# Patient Record
Sex: Female | Born: 1940 | Race: White | Hispanic: No | Marital: Married | Smoking: Former smoker
Health system: Southern US, Community
[De-identification: ages and names within clinical notes are randomized; demographics above are authoritative.]

## PROBLEM LIST (undated history)

## (undated) DIAGNOSIS — H269 Unspecified cataract: Secondary | ICD-10-CM

## (undated) DIAGNOSIS — Z8669 Personal history of other diseases of the nervous system and sense organs: Secondary | ICD-10-CM

## (undated) DIAGNOSIS — E7800 Pure hypercholesterolemia, unspecified: Secondary | ICD-10-CM

## (undated) DIAGNOSIS — I8390 Asymptomatic varicose veins of unspecified lower extremity: Secondary | ICD-10-CM

## (undated) DIAGNOSIS — E119 Type 2 diabetes mellitus without complications: Secondary | ICD-10-CM

## (undated) DIAGNOSIS — M1990 Unspecified osteoarthritis, unspecified site: Secondary | ICD-10-CM

## (undated) DIAGNOSIS — K219 Gastro-esophageal reflux disease without esophagitis: Secondary | ICD-10-CM

## (undated) DIAGNOSIS — I10 Essential (primary) hypertension: Secondary | ICD-10-CM

## (undated) DIAGNOSIS — Z923 Personal history of irradiation: Secondary | ICD-10-CM

## (undated) DIAGNOSIS — N200 Calculus of kidney: Secondary | ICD-10-CM

## (undated) DIAGNOSIS — C50919 Malignant neoplasm of unspecified site of unspecified female breast: Secondary | ICD-10-CM

## (undated) DIAGNOSIS — E079 Disorder of thyroid, unspecified: Secondary | ICD-10-CM

## (undated) HISTORY — DX: Unspecified osteoarthritis, unspecified site: M19.90

## (undated) HISTORY — PX: VARICOSE VEIN SURGERY: SHX832

## (undated) HISTORY — DX: Gastro-esophageal reflux disease without esophagitis: K21.9

## (undated) HISTORY — DX: Disorder of thyroid, unspecified: E07.9

## (undated) HISTORY — DX: Essential (primary) hypertension: I10

## (undated) HISTORY — DX: Malignant neoplasm of unspecified site of unspecified female breast: C50.919

## (undated) HISTORY — PX: CATARACT EXTRACTION W/ INTRAOCULAR LENS  IMPLANT, BILATERAL: SHX1307

## (undated) HISTORY — DX: Type 2 diabetes mellitus without complications: E11.9

## (undated) HISTORY — DX: Unspecified cataract: H26.9

## (undated) HISTORY — PX: TONSILLECTOMY: SUR1361

---

## 1998-04-06 DIAGNOSIS — Z923 Personal history of irradiation: Secondary | ICD-10-CM

## 1998-04-06 HISTORY — DX: Personal history of irradiation: Z92.3

## 1998-04-06 HISTORY — PX: BREAST BIOPSY: SHX20

## 1998-04-06 HISTORY — PX: BREAST LUMPECTOMY: SHX2

## 2001-04-06 DIAGNOSIS — M8580 Other specified disorders of bone density and structure, unspecified site: Secondary | ICD-10-CM | POA: Insufficient documentation

## 2004-03-17 ENCOUNTER — Ambulatory Visit: Payer: Self-pay

## 2004-07-20 ENCOUNTER — Emergency Department: Payer: Self-pay | Admitting: Emergency Medicine

## 2005-03-25 ENCOUNTER — Ambulatory Visit: Payer: Self-pay

## 2006-04-16 ENCOUNTER — Ambulatory Visit: Payer: Self-pay | Admitting: Family Medicine

## 2007-06-27 ENCOUNTER — Ambulatory Visit: Payer: Self-pay

## 2008-09-04 ENCOUNTER — Ambulatory Visit: Payer: Self-pay | Admitting: Family Medicine

## 2008-12-26 ENCOUNTER — Ambulatory Visit: Payer: Self-pay | Admitting: Ophthalmology

## 2011-08-31 ENCOUNTER — Emergency Department: Payer: Self-pay | Admitting: *Deleted

## 2012-06-17 ENCOUNTER — Ambulatory Visit: Payer: Self-pay | Admitting: Family Medicine

## 2013-05-08 ENCOUNTER — Ambulatory Visit: Payer: Self-pay | Admitting: Physician Assistant

## 2013-05-08 LAB — RAPID INFLUENZA A&B ANTIGENS (ARMC ONLY)

## 2014-04-06 HISTORY — PX: BREAST BIOPSY: SHX20

## 2014-04-06 HISTORY — PX: MASTECTOMY: SHX3

## 2014-07-06 ENCOUNTER — Ambulatory Visit
Admit: 2014-07-06 | Disposition: A | Discharge status: Home / Self Care | Payer: Self-pay | Attending: Nurse Practitioner | Admitting: Nurse Practitioner

## 2014-07-18 ENCOUNTER — Ambulatory Visit
Admit: 2014-07-18 | Disposition: A | Discharge status: Home / Self Care | Payer: Self-pay | Attending: Nurse Practitioner | Admitting: Nurse Practitioner

## 2014-07-27 ENCOUNTER — Ambulatory Visit
Admit: 2014-07-27 | Disposition: A | Discharge status: Home / Self Care | Payer: Medicare Other | Attending: Surgery | Admitting: Surgery

## 2014-08-07 ENCOUNTER — Encounter: Payer: Self-pay | Admitting: Oncology

## 2014-08-07 ENCOUNTER — Inpatient Hospital Stay: Payer: Medicare Other | Attending: Oncology | Admitting: Oncology

## 2014-08-07 VITALS — BP 143/66 | HR 68 | Temp 97.9°F | Resp 18 | Ht 66.0 in | Wt 177.5 lb

## 2014-08-07 DIAGNOSIS — C50911 Malignant neoplasm of unspecified site of right female breast: Secondary | ICD-10-CM

## 2014-08-07 DIAGNOSIS — E119 Type 2 diabetes mellitus without complications: Secondary | ICD-10-CM | POA: Diagnosis not present

## 2014-08-07 DIAGNOSIS — Z87891 Personal history of nicotine dependence: Secondary | ICD-10-CM | POA: Diagnosis not present

## 2014-08-07 DIAGNOSIS — C50211 Malignant neoplasm of upper-inner quadrant of right female breast: Secondary | ICD-10-CM

## 2014-08-07 DIAGNOSIS — I10 Essential (primary) hypertension: Secondary | ICD-10-CM | POA: Diagnosis not present

## 2014-08-07 DIAGNOSIS — Z853 Personal history of malignant neoplasm of breast: Secondary | ICD-10-CM | POA: Diagnosis not present

## 2014-08-07 DIAGNOSIS — Z170 Estrogen receptor positive status [ER+]: Secondary | ICD-10-CM | POA: Diagnosis not present

## 2014-08-07 DIAGNOSIS — Z923 Personal history of irradiation: Secondary | ICD-10-CM

## 2014-08-07 DIAGNOSIS — K219 Gastro-esophageal reflux disease without esophagitis: Secondary | ICD-10-CM | POA: Insufficient documentation

## 2014-08-07 DIAGNOSIS — Z79899 Other long term (current) drug therapy: Secondary | ICD-10-CM

## 2014-08-08 DIAGNOSIS — E039 Hypothyroidism, unspecified: Secondary | ICD-10-CM | POA: Insufficient documentation

## 2014-08-08 DIAGNOSIS — E119 Type 2 diabetes mellitus without complications: Secondary | ICD-10-CM | POA: Insufficient documentation

## 2014-08-08 DIAGNOSIS — M1990 Unspecified osteoarthritis, unspecified site: Secondary | ICD-10-CM | POA: Insufficient documentation

## 2014-08-08 DIAGNOSIS — C50919 Malignant neoplasm of unspecified site of unspecified female breast: Secondary | ICD-10-CM | POA: Insufficient documentation

## 2014-08-08 DIAGNOSIS — E7800 Pure hypercholesterolemia, unspecified: Secondary | ICD-10-CM | POA: Insufficient documentation

## 2014-08-08 DIAGNOSIS — I10 Essential (primary) hypertension: Secondary | ICD-10-CM | POA: Insufficient documentation

## 2014-08-08 NOTE — Progress Notes (Signed)
[place] @ [facility] [phone]  Fax:(336) [place]  Sydney Parks OB: July 20, 1940  MR#: [id number]  IWO#:[id number]  Patient Care Team: Sallee Lange, NP as PCP - General (Internal Medicine)  CHIEF COMPLAINT:  Chief Complaint  Patient presents with  . Breast Cancer   74 year old lady came today further follow-up regarding carcinoma breast.  For evaluation and further planning of treatment. Oncology History   1.  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  Previous history of carcinoma breast in the same breast (right)  And 16 years ago was Tis N0 M0 tumor status post lumpectomy and radiation therapy estrogen and progesterone receptor was positive and had received 5 years of tamoxifen     Breast CA   08/08/2014 Initial Diagnosis Breast CA    No flowsheet data found.  INTERVAL HISTORY: 74 year old lady came today further follow-up regarding carcinoma breast.  For evaluation and further planning of treatment.  REVIEW OF SYSTEMS:   Review of Systems  Constitutional: Negative for fever, chills, weight loss, malaise/fatigue and diaphoresis.  HENT: Negative for congestion, ear discharge, ear pain, hearing loss, nosebleeds, sore throat and tinnitus.   Eyes: Negative for blurred vision, double vision, photophobia, pain, discharge and redness.  Respiratory: Negative for cough, hemoptysis, sputum production, shortness of breath, wheezing and stridor.   Cardiovascular: Negative for chest pain, palpitations, orthopnea, claudication, leg swelling and PND.  Gastrointestinal: Negative for heartburn, nausea, vomiting, abdominal pain, diarrhea, constipation, blood in stool and melena.  Genitourinary: Negative for dysuria, urgency, frequency, hematuria and flank pain.    Musculoskeletal: Negative for myalgias, back pain, joint pain, falls and neck pain.  Skin: Negative for itching and rash.  Neurological: Negative for dizziness, tingling, tremors, sensory change, speech change, focal weakness, seizures, loss of consciousness, weakness and headaches.  Endo/Heme/Allergies: Negative for environmental allergies and polydipsia. Does not bruise/bleed easily.  Psychiatric/Behavioral: Negative for depression, suicidal ideas, hallucinations, memory loss and substance abuse. The patient is not nervous/anxious and does not have insomnia.     As per HPI. Otherwise, a complete review of systems is negatve.  PAST MEDICAL HISTORY: Past Medical History  Diagnosis Date  . Breast cancer   . Arthritis   . Cataract   . Diabetes mellitus without complication   . GERD (gastroesophageal reflux disease)   . Hypertension   . Thyroid disease     PAST SURGICAL HISTORY: History reviewed. No pertinent past surgical history. history of tonsillectomy, lumpectomy in the past.  Cataract surgery in the past. FAMILY HISTORY No family history on file. No family history of breast cancer, colon cancer. 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 GYNECOLOGIC HISTORY:  No LMP recorded.     ADVANCED DIRECTIVES: PATIENT  refused   HEALTH MAINTENANCE: History  Substance Use Topics  . Smoking status: Former Smoker -- 0.50 packs/day for 10 years    Quit date: 08/05/1970  . Smokeless tobacco: Not on file  . Alcohol Use: No     Colonoscopy:  PAP:  Bone density:  Lipid panel:  Allergies not on file  Current Outpatient Prescriptions  Medication Sig Dispense Refill  . levothyroxine (SYNTHROID, LEVOTHROID) 112 MCG tablet Take 112 mcg by mouth daily.    Marland Kitchen lisinopril-hydrochlorothiazide (PRINZIDE,ZESTORETIC) 10-12.5 MG per tablet Take 10-12.5 tablets by mouth daily.    Marland Kitchen lovastatin (  MEVACOR) 10 MG tablet Take 10 mg by mouth daily.    . metFORMIN (GLUCOPHAGE) 500 MG  tablet Take 500 mg by mouth daily.     No current facility-administered medications for this visit.    OBJECTIVE: Filed Vitals:   08/07/14 1515  BP: 143/66  Pulse: 68  Temp: 97.9 F (36.6 C)  Resp: 18     Body mass index is 28.66 kg/(m^2).    ECOG FS:0 - Asymptomatic  Physical Exam  Constitutional: She is oriented to person, place, and time and well-developed, well-nourished, and in no distress. No distress.  HENT:  Head: Normocephalic and atraumatic.  Right Ear: External ear normal.  Left Ear: External ear normal.  Nose: Nose normal.  Mouth/Throat: Oropharynx is clear and moist.  Eyes: Conjunctivae and EOM are normal.  Neck: Normal range of motion. No JVD present. No tracheal deviation present. No thyromegaly present.  Cardiovascular: Normal rate and regular rhythm.  Exam reveals no friction rub.   No murmur heard. Pulmonary/Chest: No stridor. No respiratory distress. She has no wheezes. She has no rales. She exhibits no tenderness.  Abdominal: She exhibits no distension and no mass. There is no tenderness. There is no rebound.  Musculoskeletal: She exhibits no edema or tenderness.  Neurological: She is alert and oriented to person, place, and time. She has normal reflexes. Gait normal. GCS score is 15.  Skin: Skin is warm and dry. No rash noted. She is not diaphoretic.  Psychiatric: Mood, memory and affect normal.   examination of breast: Right breast recent biopsy at 2:00 position healing well.  No palpable mass.  Left breast feel masses.  Axillary lymph nodes not palpable   LAB RESULTS:  No results found for: NA, K, CL, CO2, GLUCOSE, BUN, CREATININE, CALCIUM, PROT, ALBUMIN, AST, ALT, ALKPHOS, BILITOT, GFRNONAA, GFRAA  No results found for: SPEP, UPEP  No results found for: WBC, NEUTROABS, HGB, HCT, MCV, PLT    Chemistry   No results found for: NA, K, CL, CO2, BUN, CREATININE, GLU No results found for: CALCIUM, ALKPHOS, AST, ALT, BILITOT     No results found for:  LABCA2  No components found for: MOLMB867  No results for input(s): INR in the last 168 hours.  No results found for: COLORURINE, APPEARANCEUR, LABSPEC, PHURINE, GLUCOSEU, HGBUR, BILIRUBINUR, KETONESUR, PROTEINUR, UROBILINOGEN, NITRITE, LEUKOCYTESUR  STUDIES: [place] Core Biopsy R/s  08/02/2014   ADDENDUM REPORT: 08/02/2014 11:22  ADDENDUM: Patient's pathology has returned. Findings are invasive mammary carcinoma. Patient's surgeon, Dr. Tamala Julian, gave the patient her pathology results. Patient will be setup within oncology consultation. No reported complaints from the biopsy site.   Electronically Signed   By: Lajean Manes M.D.   On: 08/02/2014 11:22   08/02/2014   CLINICAL DATA:  74 year old female for biopsy of an indeterminate right breast mass. The previously seen mass was identified on today's exam at 2 o'clock, 4 cm from the nipple.  EXAM: ULTRASOUND GUIDED RIGHT BREAST CORE NEEDLE BIOPSY  COMPARISON:  Previous exam(s).  PROCEDURE: I met with the patient and we discussed the procedure of ultrasound-guided biopsy, including benefits and alternatives. We discussed the high likelihood of a successful procedure. We discussed the risks of the procedure including infection, bleeding, tissue injury, clip migration, and inadequate sampling. Informed written consent was given. The usual time-out protocol was performed immediately prior to the procedure.  Using sterile technique and 2% Lidocaine as local anesthetic, under direct ultrasound visualization, a 12 gauge vacuum-assisted device was used to perform biopsy of  an indeterminate right breast mass at 2 o'clock, 4 cm from the nippleusing a medial to lateral approach. At the conclusion of the procedure, a ribbon shaped tissue marker clip was deployed into the biopsy cavity. Follow-up 2-view mammogram was performed and dictated separately.  IMPRESSION: Ultrasound-guided biopsy of an indeterminate right breast mass at 2 o'clock. No apparent complications.   Electronically Signed: By: Pamelia Hoit M.D. On: 07/27/2014 14:03   Mm Post Image Right ([facility] Hx)  07/27/2014   CLINICAL DATA:  74 year old female status post ultrasound-guided right breast biopsy  EXAM: DIAGNOSTIC RIGHT MAMMOGRAM POST ULTRASOUND BIOPSY  COMPARISON:  Previous exam(s).  FINDINGS: Mammographic images were obtained following ultrasound guided biopsy of an indeterminate right breast mass at 2 o'clock. Post biopsy mammogram demonstrates the ribbon shaped biopsy marker to be at the site of the mass within the medial right breast.  IMPRESSION: Satisfactory marker placement post ultrasound-guided right breast biopsy.  Final Assessment: Post Procedure Mammograms for Marker Placement   Electronically Signed   By: Pamelia Hoit M.D.   On: 07/27/2014 14:03    ASSESSMENT:  1.  Carcinoma of right breast status post ultrasound-guided biopsy.  6 mm lesion (April, 2016) at 2:00 position 2.  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  And 5 years of tamoxifen  PLAN:  No problem-specific assessment & plan notes found for this encounter.  I reviewed all the x-rays and all previous history as well as available lab data. Patient has second primary breast cancer in the right breast which has been previously radiated. Question about mastectomy had been discussed in detail. If patient is resistant to get mastectomy further lumpectomy or possibility of for partial breast radiation should be discussed with radiation oncologists. If patient is extremely against mastectomy than lumpectomy followed by anti-hormonal therapy with letrozole may be an option given though that is not is standard option. Total duration of visit was 60  minutes of reviewing all these options with patient and review all the records  Patient expressed understanding and was in agreement with this plan. She also understands that She can call clinic at any time  with any questions, concerns, or complaints.    Breast CA   Staging form: Breast, AJCC 7th Edition     Clinical: Stage IA (T1b, N0, M0) - Unsigned   Forest Gleason, MD   08/08/2014 5:02 PM

## 2014-08-13 ENCOUNTER — Other Ambulatory Visit: Payer: Self-pay | Admitting: Surgery

## 2014-08-13 DIAGNOSIS — C50211 Malignant neoplasm of upper-inner quadrant of right female breast: Secondary | ICD-10-CM

## 2014-08-14 ENCOUNTER — Encounter
Admission: RE | Admit: 2014-08-14 | Discharge: 2014-08-14 | Disposition: A | Discharge status: Home / Self Care | Payer: Medicare Other | Source: Ambulatory Visit | Attending: Surgery | Admitting: Surgery

## 2014-08-14 DIAGNOSIS — Z01810 Encounter for preprocedural cardiovascular examination: Secondary | ICD-10-CM | POA: Diagnosis not present

## 2014-08-14 DIAGNOSIS — M8580 Other specified disorders of bone density and structure, unspecified site: Secondary | ICD-10-CM | POA: Insufficient documentation

## 2014-08-14 DIAGNOSIS — E119 Type 2 diabetes mellitus without complications: Secondary | ICD-10-CM | POA: Diagnosis not present

## 2014-08-14 DIAGNOSIS — Z01812 Encounter for preprocedural laboratory examination: Secondary | ICD-10-CM | POA: Diagnosis present

## 2014-08-14 DIAGNOSIS — I10 Essential (primary) hypertension: Secondary | ICD-10-CM | POA: Diagnosis not present

## 2014-08-14 DIAGNOSIS — N200 Calculus of kidney: Secondary | ICD-10-CM | POA: Insufficient documentation

## 2014-08-14 DIAGNOSIS — E780 Pure hypercholesterolemia: Secondary | ICD-10-CM | POA: Insufficient documentation

## 2014-08-14 DIAGNOSIS — E039 Hypothyroidism, unspecified: Secondary | ICD-10-CM | POA: Insufficient documentation

## 2014-08-14 DIAGNOSIS — C50919 Malignant neoplasm of unspecified site of unspecified female breast: Secondary | ICD-10-CM | POA: Diagnosis not present

## 2014-08-14 HISTORY — DX: Pure hypercholesterolemia, unspecified: E78.00

## 2014-08-14 HISTORY — DX: Calculus of kidney: N20.0

## 2014-08-14 HISTORY — DX: Personal history of other diseases of the nervous system and sense organs: Z86.69

## 2014-08-14 HISTORY — DX: Asymptomatic varicose veins of unspecified lower extremity: I83.90

## 2014-08-14 LAB — BASIC METABOLIC PANEL
ANION GAP: 8 (ref 5–15)
BUN: 16 mg/dL (ref 6–20)
CHLORIDE: 104 mmol/L (ref 101–111)
CO2: 30 mmol/L (ref 22–32)
Calcium: 9.6 mg/dL (ref 8.9–10.3)
Creatinine, Ser: 0.76 mg/dL (ref 0.44–1.00)
GFR calc Af Amer: >60 mL/min (ref >60)
GFR calc non Af Amer: >60 mL/min (ref >60)
Glucose, Bld: 111 mg/dL — ABNORMAL HIGH (ref 65–99)
POTASSIUM: 4.8 mmol/L (ref 3.5–5.1)
SODIUM: 142 mmol/L (ref 135–145)

## 2014-08-14 NOTE — Patient Instructions (Addendum)
  Your procedure is scheduled on: Tuesday Aug 21, 2014 Report to Radiology Desk at noon on Aug 21, 2014.Marland Kitchen   Remember: Instructions that are not followed completely may result in serious medical risk, up to and including death, or upon the discretion of your surgeon and anesthesiologist your surgery may need to be rescheduled.    __x__ 1. Do not eat food or drink liquids after midnight. No gum chewing or hard candies.     __x__ 2. No Alcohol for 24 hours before or after surgery.   ___ 3. Bring all medications with you on the day of surgery if instructed.    __x__ 4. Notify your doctor if there is any change in your medical condition     (cold, fever, infections).     Do not wear jewelry, make-up, hairpins, clips or nail polish.  Do not wear lotions, powders, or perfumes. You may wear deodorant.  Do not shave 48 hours prior to surgery. Men may shave face and neck.  Do not bring valuables to the hospital.    [facility] is not responsible for any belongings or valuables.               Contacts, dentures or bridgework may not be worn into surgery.  Leave your suitcase in the car. After surgery it may be brought to your room.  For patients admitted to the hospital, discharge time is determined by your treatment team.   Patients discharged the day of surgery will not be allowed to drive home.   Please read over the following fact sheets that you were given:   [facility] Preparing for Surgery   __X__ Take these medicines the morning of surgery with A SIP OF WATER:    1. Synthroid   ____ Fleet Enema (as directed)   _X___ Use CHG Soap as directed  ____ Use inhalers on the day of surgery  _X___ Stop metformin 2 days prior to surgery, stop on Sunday Aug 19, 2014.   ____ Take 1/2 of usual insulin dose the night before surgery and none on the morning of surgery.   ____ Stop Coumadin/Plavix/aspirin on Does not apply  _X___ Stop Anti-inflammatories on today, (no ibuprofen)   ____  Stop supplements until after surgery.    ____ Bring C-Pap to the hospital.

## 2014-08-16 ENCOUNTER — Ambulatory Visit: Payer: Medicare Other | Admitting: Radiation Oncology

## 2014-08-20 ENCOUNTER — Encounter: Payer: Self-pay | Admitting: Surgery

## 2014-08-21 ENCOUNTER — Observation Stay
Admission: RE | Admit: 2014-08-21 | Discharge: 2014-08-22 | Disposition: A | Discharge status: Home / Self Care | Payer: Medicare Other | Source: Ambulatory Visit | Attending: Surgery | Admitting: Surgery

## 2014-08-21 ENCOUNTER — Encounter
Admission: RE | Disposition: A | Discharge status: Home / Self Care | Payer: Self-pay | Source: Ambulatory Visit | Attending: Surgery

## 2014-08-21 ENCOUNTER — Encounter: Payer: Self-pay | Admitting: *Deleted

## 2014-08-21 ENCOUNTER — Inpatient Hospital Stay: Payer: Medicare Other | Admitting: Anesthesiology

## 2014-08-21 ENCOUNTER — Ambulatory Visit
Admission: RE | Admit: 2014-08-21 | Discharge: 2014-08-21 | Disposition: A | Discharge status: Home / Self Care | Payer: Medicare Other | Source: Ambulatory Visit | Attending: Surgery | Admitting: Surgery

## 2014-08-21 DIAGNOSIS — Z853 Personal history of malignant neoplasm of breast: Secondary | ICD-10-CM | POA: Insufficient documentation

## 2014-08-21 DIAGNOSIS — C50211 Malignant neoplasm of upper-inner quadrant of right female breast: Secondary | ICD-10-CM | POA: Diagnosis present

## 2014-08-21 DIAGNOSIS — Z87442 Personal history of urinary calculi: Secondary | ICD-10-CM | POA: Insufficient documentation

## 2014-08-21 DIAGNOSIS — Z9842 Cataract extraction status, left eye: Secondary | ICD-10-CM | POA: Insufficient documentation

## 2014-08-21 DIAGNOSIS — Z9841 Cataract extraction status, right eye: Secondary | ICD-10-CM | POA: Diagnosis not present

## 2014-08-21 DIAGNOSIS — Z87891 Personal history of nicotine dependence: Secondary | ICD-10-CM | POA: Insufficient documentation

## 2014-08-21 DIAGNOSIS — M1990 Unspecified osteoarthritis, unspecified site: Secondary | ICD-10-CM | POA: Diagnosis not present

## 2014-08-21 DIAGNOSIS — Z9889 Other specified postprocedural states: Secondary | ICD-10-CM | POA: Insufficient documentation

## 2014-08-21 DIAGNOSIS — Z170 Estrogen receptor positive status [ER+]: Secondary | ICD-10-CM | POA: Insufficient documentation

## 2014-08-21 DIAGNOSIS — E119 Type 2 diabetes mellitus without complications: Secondary | ICD-10-CM | POA: Diagnosis not present

## 2014-08-21 DIAGNOSIS — C50919 Malignant neoplasm of unspecified site of unspecified female breast: Secondary | ICD-10-CM | POA: Diagnosis present

## 2014-08-21 DIAGNOSIS — E039 Hypothyroidism, unspecified: Secondary | ICD-10-CM | POA: Diagnosis not present

## 2014-08-21 DIAGNOSIS — I10 Essential (primary) hypertension: Secondary | ICD-10-CM | POA: Diagnosis not present

## 2014-08-21 DIAGNOSIS — M8580 Other specified disorders of bone density and structure, unspecified site: Secondary | ICD-10-CM | POA: Insufficient documentation

## 2014-08-21 DIAGNOSIS — Z9010 Acquired absence of unspecified breast and nipple: Secondary | ICD-10-CM

## 2014-08-21 DIAGNOSIS — Z79899 Other long term (current) drug therapy: Secondary | ICD-10-CM | POA: Insufficient documentation

## 2014-08-21 HISTORY — PX: SIMPLE MASTECTOMY WITH AXILLARY SENTINEL NODE BIOPSY: SHX6098

## 2014-08-21 LAB — GLUCOSE, CAPILLARY
GLUCOSE-CAPILLARY: 82 mg/dL (ref 65–99)
Glucose-Capillary: 119 mg/dL — ABNORMAL HIGH (ref 65–99)

## 2014-08-21 SURGERY — SIMPLE MASTECTOMY WITH AXILLARY SENTINEL NODE BIOPSY
Anesthesia: General | Laterality: Right | Wound class: Clean

## 2014-08-21 MED ORDER — STERILE WATER FOR IRRIGATION IR SOLN
Status: DC | PRN
  Administered 2014-08-21: 400 mL

## 2014-08-21 MED ORDER — LEVOTHYROXINE SODIUM 112 MCG PO TABS: 112.0000 ug | ORAL_TABLET | ORAL | Status: DC

## 2014-08-21 MED ORDER — FENTANYL CITRATE (PF) 100 MCG/2ML IJ SOLN
INTRAMUSCULAR | Status: AC
  Administered 2014-08-21: 25 ug via INTRAVENOUS
  Filled 2014-08-21: qty 2

## 2014-08-21 MED ORDER — ONDANSETRON HCL 4 MG/2ML IJ SOLN: 4.0000 mg | Freq: Four times a day (QID) | INTRAMUSCULAR | Status: DC | PRN

## 2014-08-21 MED ORDER — TECHNETIUM TC 99M SULFUR COLLOID
1.0000 | Freq: Once | INTRAVENOUS | Status: AC | PRN
  Administered 2014-08-21: 0.999 via INTRAVENOUS

## 2014-08-21 MED ORDER — LISINOPRIL 10 MG PO TABS: 10.0000 mg | ORAL_TABLET | ORAL | Status: DC

## 2014-08-21 MED ORDER — PROMETHAZINE HCL 25 MG/ML IJ SOLN
INTRAMUSCULAR | Status: AC
  Filled 2014-08-21: qty 1

## 2014-08-21 MED ORDER — HYDROMORPHONE HCL 1 MG/ML IJ SOLN
INTRAMUSCULAR | Status: DC | PRN
  Administered 2014-08-21: 1 mg via INTRAVENOUS

## 2014-08-21 MED ORDER — ROCURONIUM BROMIDE 100 MG/10ML IV SOLN
INTRAVENOUS | Status: DC | PRN
  Administered 2014-08-21: 35 mg via INTRAVENOUS

## 2014-08-21 MED ORDER — ONDANSETRON HCL 4 MG/2ML IJ SOLN
INTRAMUSCULAR | Status: AC
  Administered 2014-08-21: 4 mg via INTRAVENOUS
  Filled 2014-08-21: qty 2

## 2014-08-21 MED ORDER — LISINOPRIL-HYDROCHLOROTHIAZIDE 10-12.5 MG PO TABS: 1.0000 | ORAL_TABLET | ORAL | Status: DC

## 2014-08-21 MED ORDER — KETAMINE HCL 50 MG/ML IJ SOLN
INTRAMUSCULAR | Status: DC | PRN
  Administered 2014-08-21: 25 mg via INTRAMUSCULAR

## 2014-08-21 MED ORDER — ACETAMINOPHEN 10 MG/ML IV SOLN
INTRAVENOUS | Status: DC | PRN
  Administered 2014-08-21: 1000 mg via INTRAVENOUS

## 2014-08-21 MED ORDER — METFORMIN HCL 500 MG PO TABS: 500.0000 mg | ORAL_TABLET | ORAL | Status: DC

## 2014-08-21 MED ORDER — GLYCOPYRROLATE 0.2 MG/ML IJ SOLN
0.2000 mg | Freq: Once | INTRAMUSCULAR | Status: AC
  Administered 2014-08-21: 0.2 mg via INTRAVENOUS

## 2014-08-21 MED ORDER — HYDROCODONE-ACETAMINOPHEN 5-325 MG PO TABS
1.0000 | ORAL_TABLET | ORAL | Status: DC | PRN
  Filled 2014-08-21: qty 1
  Filled 2014-08-21: qty 2

## 2014-08-21 MED ORDER — PROMETHAZINE HCL 25 MG/ML IJ SOLN
6.2500 mg | Freq: Once | INTRAMUSCULAR | Status: AC
  Administered 2014-08-21: 6.25 mg via INTRAVENOUS

## 2014-08-21 MED ORDER — PRAVASTATIN SODIUM 10 MG PO TABS
10.0000 mg | ORAL_TABLET | Freq: Every day | ORAL | Status: DC
  Administered 2014-08-21: 10 mg via ORAL
  Filled 2014-08-21 (×2): qty 1

## 2014-08-21 MED ORDER — BUPIVACAINE-EPINEPHRINE (PF) 0.5% -1:200000 IJ SOLN
INTRAMUSCULAR | Status: DC | PRN
  Administered 2014-08-21: 7 mL

## 2014-08-21 MED ORDER — ACETAMINOPHEN 325 MG PO TABS: 650.0000 mg | ORAL_TABLET | ORAL | Status: DC | PRN

## 2014-08-21 MED ORDER — DEXTROSE-NACL 5-0.2 % IV SOLN
INTRAVENOUS | Status: DC
  Administered 2014-08-21: 22:00:00 via INTRAVENOUS

## 2014-08-21 MED ORDER — ONDANSETRON HCL 4 MG/2ML IJ SOLN
4.0000 mg | Freq: Once | INTRAMUSCULAR | Status: AC | PRN
  Administered 2014-08-21: 4 mg via INTRAVENOUS

## 2014-08-21 MED ORDER — PROPOFOL 10 MG/ML IV BOLUS
INTRAVENOUS | Status: DC | PRN
  Administered 2014-08-21: 100 mg via INTRAVENOUS
  Administered 2014-08-21: 50 mg via INTRAVENOUS

## 2014-08-21 MED ORDER — FENTANYL CITRATE (PF) 100 MCG/2ML IJ SOLN
INTRAMUSCULAR | Status: DC | PRN
  Administered 2014-08-21: 150 ug via INTRAVENOUS
  Administered 2014-08-21: 100 ug via INTRAVENOUS

## 2014-08-21 MED ORDER — FAMOTIDINE 20 MG PO TABS
20.0000 mg | ORAL_TABLET | Freq: Once | ORAL | Status: AC
  Administered 2014-08-21: 20 mg via ORAL

## 2014-08-21 MED ORDER — BUPIVACAINE-EPINEPHRINE (PF) 0.5% -1:200000 IJ SOLN
INTRAMUSCULAR | Status: AC
  Filled 2014-08-21: qty 30

## 2014-08-21 MED ORDER — ACETAMINOPHEN 10 MG/ML IV SOLN
INTRAVENOUS | Status: AC
  Filled 2014-08-21: qty 100

## 2014-08-21 MED ORDER — HYDROCODONE-ACETAMINOPHEN 5-325 MG PO TABS
1.0000 | ORAL_TABLET | ORAL | Status: DC | PRN
  Administered 2014-08-21: 1 via ORAL
  Administered 2014-08-22 (×2): 2 via ORAL
  Filled 2014-08-21: qty 2

## 2014-08-21 MED ORDER — LIDOCAINE HCL (CARDIAC) 20 MG/ML IV SOLN
INTRAVENOUS | Status: DC | PRN
  Administered 2014-08-21: 100 mg via INTRAVENOUS

## 2014-08-21 MED ORDER — ONDANSETRON HCL 4 MG/2ML IJ SOLN
INTRAMUSCULAR | Status: DC | PRN
  Administered 2014-08-21: 4 mg via INTRAVENOUS

## 2014-08-21 MED ORDER — GLYCOPYRROLATE 0.2 MG/ML IJ SOLN
INTRAMUSCULAR | Status: AC
  Administered 2014-08-21: 0.2 mg via INTRAVENOUS
  Filled 2014-08-21: qty 1

## 2014-08-21 MED ORDER — FAMOTIDINE 20 MG PO TABS
ORAL_TABLET | ORAL | Status: AC
  Administered 2014-08-21: 20 mg via ORAL
  Filled 2014-08-21: qty 1

## 2014-08-21 MED ORDER — SODIUM CHLORIDE 0.9 % IV SOLN
INTRAVENOUS | Status: DC
  Administered 2014-08-21 (×2): via INTRAVENOUS

## 2014-08-21 MED ORDER — HYDROCHLOROTHIAZIDE 12.5 MG PO CAPS: 12.5000 mg | ORAL_CAPSULE | ORAL | Status: DC

## 2014-08-21 MED ORDER — FENTANYL CITRATE (PF) 100 MCG/2ML IJ SOLN
25.0000 ug | INTRAMUSCULAR | Status: DC | PRN
  Administered 2014-08-21 (×4): 25 ug via INTRAVENOUS

## 2014-08-21 SURGICAL SUPPLY — 38 items
BENZOIN TINCTURE PRP APPL 2/3 (GAUZE/BANDAGES/DRESSINGS) ×3 IMPLANT
BULB RESERV EVAC DRAIN JP 100C (MISCELLANEOUS) ×6 IMPLANT
CANISTER SUCT 1200ML W/VALVE (MISCELLANEOUS) ×3 IMPLANT
CHLORAPREP W/TINT 26ML (MISCELLANEOUS) ×3 IMPLANT
CNTNR SPEC 2.5X3XGRAD LEK (MISCELLANEOUS) ×1
CONT SPEC 4OZ STER OR WHT (MISCELLANEOUS) ×2
CONTAINER SPEC 2.5X3XGRAD LEK (MISCELLANEOUS) ×1 IMPLANT
DEVICE DUBIN SPECIMEN MAMMOGRA (MISCELLANEOUS) ×3 IMPLANT
DRAIN CHANNEL JP 15F RND 16 (MISCELLANEOUS) ×6 IMPLANT
DRAPE LAPAROTOMY TRNSV 106X77 (MISCELLANEOUS) ×3 IMPLANT
GAUZE SPONGE 4X4 12PLY STRL (GAUZE/BANDAGES/DRESSINGS) ×3 IMPLANT
GLOVE BIO SURGEON STRL SZ7 (GLOVE) ×3 IMPLANT
GLOVE BIO SURGEON STRL SZ7.5 (GLOVE) ×9 IMPLANT
GOWN STRL REUS W/ TWL LRG LVL3 (GOWN DISPOSABLE) ×2 IMPLANT
GOWN STRL REUS W/TWL LRG LVL3 (GOWN DISPOSABLE) ×4
KIT RM TURNOVER STRD PROC AR (KITS) ×3 IMPLANT
LABEL OR SOLS (LABEL) ×3 IMPLANT
LIQUID BAND (GAUZE/BANDAGES/DRESSINGS) ×3 IMPLANT
PACK BASIN MINOR ARMC (MISCELLANEOUS) ×3 IMPLANT
PAD GROUND ADULT SPLIT (MISCELLANEOUS) ×3 IMPLANT
SLEVE PROBE SENORX GAMMA FIND (MISCELLANEOUS) ×3 IMPLANT
SPONGE LAP 18X18 5 PK (GAUZE/BANDAGES/DRESSINGS) ×3 IMPLANT
SUT CHROMIC 3 0 SH 27 (SUTURE) ×3 IMPLANT
SUT CHROMIC 4 0 RB 1X27 (SUTURE) ×3 IMPLANT
SUT ETHILON 3-0 FS-10 30 BLK (SUTURE) ×3
SUT ETHILON 4-0 (SUTURE)
SUT ETHILON 4-0 FS2 18XMFL BLK (SUTURE)
SUT MNCRL 4-0 (SUTURE) ×4
SUT MNCRL 4-0 27XMFL (SUTURE) ×2
SUT SILK 3-0 (SUTURE) ×4
SUT SILK 3-0 SH-1 18XCR BRD (SUTURE) ×2
SUT VICRYL+ 3-0 144IN (SUTURE) IMPLANT
SUTURE EHLN 3-0 FS-10 30 BLK (SUTURE) ×1 IMPLANT
SUTURE ETHLN 4-0 FS2 18XMF BLK (SUTURE) IMPLANT
SUTURE MNCRL 4-0 27XMF (SUTURE) ×2 IMPLANT
SUTURE SILK 3-0 SH-1 18XCR BRD (SUTURE) ×2 IMPLANT
TAPE CLOTH SOFT 2X10 (GAUZE/BANDAGES/DRESSINGS) ×3 IMPLANT
WATER STERILE IRR 1000ML POUR (IV SOLUTION) ×3 IMPLANT

## 2014-08-21 NOTE — H&P (Signed)
  She reports no change in overall condition since the day of the office visit.  I have discussed doing the right simple mastectomy with axillary sentinel lymph node biopsy possible axillary lymph node dissection.  08/21/2014  AnvikD.

## 2014-08-21 NOTE — Op Note (Signed)
Operative report  Preop diagnosis carcinoma of the right breast  Postoperative diagnosis carcinoma of the right breast  Procedure right mastectomy with sentinel lymph node biopsy  Anesthesia General  Surgeon Rochel Brome M.D.  Indications this 74 year old female has a past history of partial mastectomy of the upper inner quadrant in year 2000. She also had adjuvant radiation therapy. Recent mammogram demonstrated a cluster of calcifications of the upper inner quadrant of the right breast. Ultrasound demonstrated a hypoechoic nodule measuring 6 mm at the 2:00 position 2 cm from the nipple. Needle biopsy demonstrated invasive mammary carcinoma. Surgery was recommended for definitive treatment. She did have preoperative injection of radioactive technetium sulfur colloid  The patient was placed on the operating table in the supine position under general anesthesia. The right arm was placed on the lateral arm support. The breast and surrounding chest wall were prepared with ChloraPrep and draped in a sterile manner  A transversely oriented curvilinear incision was made from medial to lateral both above and below the breast. The dissection was carried down into subcutaneous tissues using electrocautery for hemostasis. Silk sutures were attached to the skin edges for traction. Skin and subcutaneous flaps were raised in the direction of the clavicle medially towards the sternum inferiorly to the inframammary fold and laterally to the latissimus dorsi muscle.  Further dissection was carried out up into the axilla and used the gamma counter to demonstrate the location of a sentinel lymph node adjacent to the rib cage.. There was a lymph node which was found which was approximately 8 mm in dimension. It was soft and smooth. The ex vivo count was in the range of 60-100 counts per second. The background count was minimal. There was no remaining palpable mass within the axilla. The sentinel lymph node was  submitted fresh for frozen section.  The breast was elevated off the underlying deep fascia using electrocautery for hemostasis and this dissection was carried out up to the axilla.  The pathologist later called back to report that the frozen section of the sentinel lymph node was benign.  The resection included the axillary tail of the breast. The lateral end of the skin ellipse was tied with a silk stitch. The breast was submitted fresh for routine pathology.  The wound was inspected and determined hemostasis was and tacked it is noted that during the course of the procedure hemostasis was achieved with electrocautery and also a number of 4-0 chromic and 3-0 chromic suture ligatures. 2:15 [place] Blake drains were inserted through separate inferior stab wounds. One drain was placed along the anterior chest wall and the other extended up into the axilla. The string was secured to the skin with 3-0 nylon suture. The skin incision was closed with a running 4-0 Monocryl subcuticular suture and LiquiBand. A dressing was applied around the drains using folded cotton gauze benzoin and 2 inch silk tape. The drains were activated. There was minimal serosanguineous drainage. The patient appeared to tolerate the procedure satisfactorily and was then prepared for transfer to the recovery room  [facility].D.

## 2014-08-21 NOTE — Transfer of Care (Signed)
Immediate Anesthesia Transfer of Care Note  Patient: Sydney Parks  Procedure(s) Performed: Procedure(s): SIMPLE MASTECTOMY WITH AXILLARY SENTINEL NODE BIOPSY (Right)  Patient Location: PACU  Anesthesia Type:General  Level of Consciousness: awake, alert  and oriented  Airway & Oxygen Therapy: Patient Spontanous Breathing and Patient connected to nasal cannula oxygen  Post-op Assessment: Report given to RN and Post -op Vital signs reviewed and stable  Post vital signs: Reviewed and stable  Last Vitals:  Filed Vitals:   08/21/14 1625  BP: 181/66  Pulse: 69  Temp: 37.4 C  Resp:     Complications: No apparent anesthesia complications

## 2014-08-21 NOTE — Anesthesia Postprocedure Evaluation (Signed)
  Anesthesia Post-op Note  Patient: Sydney Parks  Procedure(s) Performed: Procedure(s): SIMPLE MASTECTOMY WITH AXILLARY SENTINEL NODE BIOPSY (Right)  Anesthesia type:General  Patient location: PACU  Post pain: Pain level controlled  Post assessment: Post-op Vital signs reviewed, Patient's Cardiovascular Status Stable, Respiratory Function Stable, Patent Airway and No signs of Nausea or vomiting  Post vital signs: Reviewed and stable  Last Vitals:  Filed Vitals:   08/21/14 1309  BP: 173/73  Pulse: 57  Temp: 37 C  Resp: 16    Level of consciousness: awake, alert  and patient cooperative  Complications: No apparent anesthesia complications

## 2014-08-21 NOTE — Anesthesia Preprocedure Evaluation (Signed)
Anesthesia Evaluation  Patient identified by MRN, date of birth, ID band Patient awake    Airway Mallampati: II  TM Distance: >3 FB Neck ROM: Full    Dental  (+) Partial Lower, Upper Dentures, Edentulous Upper   Pulmonary former smoker (quit x 44 yrs),          Cardiovascular hypertension, Pt. on medications     Neuro/Psych    GI/Hepatic   Endo/Other  diabetes, Well Controlled, Type 2, Oral Hypoglycemic AgentsHypothyroidism   Renal/GU Renal disease (Stones)     Musculoskeletal   Abdominal   Peds  Hematology   Anesthesia Other Findings   Reproductive/Obstetrics                             Anesthesia Physical Anesthesia Plan  ASA: III  Anesthesia Plan: General   Post-op Pain Management:    Induction:   Airway Management Planned: LMA  Additional Equipment:   Intra-op Plan:   Post-operative Plan:   Informed Consent: I have reviewed the patients History and Physical, chart, labs and discussed the procedure including the risks, benefits and alternatives for the proposed anesthesia with the patient or authorized representative who has indicated his/her understanding and acceptance.     Plan Discussed with:   Anesthesia Plan Comments:         Anesthesia Quick Evaluation

## 2014-08-21 NOTE — Discharge Instructions (Signed)
Empty each drain 2 times daily and record amount of drainage. Label drains #1 and #2.  Call the surgery office on Monday to report the amount of drainage from each drain.  Take Tylenol or Norco as needed for pain.  Continue usual medicines.  Avoid vigorous activities with the right arm until after office visit.  Avoid needle sticks and blood pressure checks in the right arm.

## 2014-08-22 ENCOUNTER — Encounter: Payer: Self-pay | Admitting: Surgery

## 2014-08-22 DIAGNOSIS — C50211 Malignant neoplasm of upper-inner quadrant of right female breast: Secondary | ICD-10-CM | POA: Diagnosis not present

## 2014-08-22 MED ORDER — METFORMIN HCL 500 MG PO TABS
500.0000 mg | ORAL_TABLET | ORAL | Status: DC
  Administered 2014-08-22: 500 mg via ORAL
  Filled 2014-08-22 (×2): qty 1

## 2014-08-22 MED ORDER — LISINOPRIL 10 MG PO TABS
10.0000 mg | ORAL_TABLET | ORAL | Status: DC
  Administered 2014-08-22: 10 mg via ORAL
  Filled 2014-08-22: qty 1

## 2014-08-22 MED ORDER — LEVOTHYROXINE SODIUM 112 MCG PO TABS
112.0000 ug | ORAL_TABLET | ORAL | Status: DC
  Administered 2014-08-22: 112 ug via ORAL
  Filled 2014-08-22: qty 1

## 2014-08-22 MED ORDER — HYDROCODONE-ACETAMINOPHEN 5-325 MG PO TABS: 1.0000 | ORAL_TABLET | ORAL | Status: DC | PRN

## 2014-08-22 MED ORDER — HYDROCHLOROTHIAZIDE 12.5 MG PO CAPS
12.5000 mg | ORAL_CAPSULE | ORAL | Status: DC
  Administered 2014-08-22: 12.5 mg via ORAL
  Filled 2014-08-22: qty 1

## 2014-08-22 NOTE — Discharge Summary (Signed)
  This 74 year old female with past history of carcinoma of the right breast and  radiation therapy recently had findings of a cancer of the right breast.  Details of the past medical history and medicines are recorded on the typed H&P  She was brought in through the outpatient surgery department and went to the operating room and had a right mastectomy with axillary sentinel lymph node biopsy. She also had insertion of 2 Blake drains.  Postoperatively the decision was made to keep him overnight for a period of observation. Today she appears to be making satisfactory progress. Vital signs are stable. She is tolerating her diet satisfactorily.  Her wound is progressing satisfactorily with glue remaining intact. Drains are functioning satisfactorily  Final diagnosis cancer of the right breast  Operation right simple mastectomy with axillary sentinel lymph node biopsy  Wound care instructions were given.  Anesthesia take Tylenol or Norco as needed for pain  She will call the office next Monday to schedule a follow-up appointment

## 2014-08-22 NOTE — Progress Notes (Signed)
Pt oriented to room environment. Verbalized understanding of call light and ascom phone use. R chest with dressing C/D/I, JP bulbs x2 noted with dark red drainage. Pt's family at the bedside for support. Pt ambulates to BR with assist x1, had one episode of stress incontinence. Pain rated 5/10 at R surgical chest site. Pink sleeve placed on R arm. IVF's infusing without difficulty. Call light and phone in reach.

## 2014-08-23 LAB — SURGICAL PATHOLOGY

## 2014-08-24 ENCOUNTER — Telehealth: Payer: Self-pay | Admitting: *Deleted

## 2014-08-24 LAB — SURGICAL PATHOLOGY

## 2014-08-24 NOTE — Telephone Encounter (Signed)
Left message with patient to return my call.  Follow-up call to patient.  She is status post surgery this week.

## 2014-08-30 LAB — SURGICAL PATHOLOGY

## 2014-09-04 ENCOUNTER — Encounter: Payer: Self-pay | Admitting: Oncology

## 2014-09-04 ENCOUNTER — Inpatient Hospital Stay (HOSPITAL_BASED_OUTPATIENT_CLINIC_OR_DEPARTMENT_OTHER): Payer: Medicare Other | Admitting: Oncology

## 2014-09-04 VITALS — BP 159/81 | HR 57 | Temp 98.1°F | Resp 18 | Ht 66.0 in | Wt 177.8 lb

## 2014-09-04 DIAGNOSIS — C50211 Malignant neoplasm of upper-inner quadrant of right female breast: Secondary | ICD-10-CM

## 2014-09-04 DIAGNOSIS — Z79899 Other long term (current) drug therapy: Secondary | ICD-10-CM

## 2014-09-04 DIAGNOSIS — Z170 Estrogen receptor positive status [ER+]: Secondary | ICD-10-CM

## 2014-09-04 DIAGNOSIS — C50911 Malignant neoplasm of unspecified site of right female breast: Secondary | ICD-10-CM

## 2014-09-04 DIAGNOSIS — Z9011 Acquired absence of right breast and nipple: Secondary | ICD-10-CM | POA: Diagnosis not present

## 2014-09-04 DIAGNOSIS — E119 Type 2 diabetes mellitus without complications: Secondary | ICD-10-CM

## 2014-09-04 DIAGNOSIS — Z853 Personal history of malignant neoplasm of breast: Secondary | ICD-10-CM

## 2014-09-04 DIAGNOSIS — I10 Essential (primary) hypertension: Secondary | ICD-10-CM

## 2014-09-04 DIAGNOSIS — M8580 Other specified disorders of bone density and structure, unspecified site: Secondary | ICD-10-CM

## 2014-09-04 MED ORDER — LETROZOLE 2.5 MG PO TABS: 2.5000 mg | ORAL_TABLET | Freq: Every day | ORAL | Status: DC

## 2014-09-04 MED ORDER — CALCIUM CARBONATE-VITAMIN D 500-200 MG-UNIT PO TABS: 1.0000 | ORAL_TABLET | Freq: Two times a day (BID) | ORAL | Status: DC

## 2014-09-04 NOTE — Progress Notes (Signed)
[place] @ [facility] [phone]) [phone]  Fax:(336) [name]  Sydney Parks OB: 06/05/1940  MR#: [id number]  ZHY#:[id number]  Patient Care Team: Sallee Lange, NP as PCP - General (Internal Medicine) Leonie Green, MD as Referring Physician (Surgery)  CHIEF COMPLAINT:  Chief Complaint  Patient presents with  . Follow-up    discuss pathology and tx options. newly dx breast cancer   74 year old lady came today further follow-up regarding carcinoma breast.  For evaluation and further planning of treatment. Oncology History   1.  Carcinoma breast (right) at 2:00 position biopsies positive for invasive mammary carcinoma Mammogram was done in April of 2016 Estrogen receptor positive progesterone receptor positive HER-2/neu receptor negative by fish Clinically staged as T1 b N0 M0 tumor 2.  Previous history of carcinoma breast in the same breast (right) Status post mastectomy.  And 16 years ago was Tis N0 M0 tumor status post lumpectomy and radiation therapy estrogen and progesterone receptor was positive and had received 5 years of tamoxifen     Breast CA (Resolved)   08/08/2014 Initial Diagnosis Breast CA    Breast cancer   08/21/2014 Initial Diagnosis Breast cancer    Oncology Flowsheet 08/21/2014 08/21/2014  ondansetron (ZOFRAN) IV - 4 mg  promethazine (PHENERGAN) IV 6.25 mg      INTERVAL HISTORY: 74 year old lady came today further follow-up regarding carcinoma breast.  For evaluation and further planning of treatment. Sep 04, 2014 Patient is here after getting mastectomy done.  Only 1 mm of invasion residual cancer was found.  Patient is gradually recovering.  Here to discuss the results of the pathology and further planning of treatment  REVIEW OF SYSTEMS:   Review of Systems  Constitutional: Negative for fever, chills, weight loss, malaise/fatigue and diaphoresis.  HENT: Negative for congestion, ear discharge, ear pain, hearing loss,  nosebleeds, sore throat and tinnitus.   Eyes: Negative for blurred vision, double vision, photophobia, pain, discharge and redness.  Respiratory: Negative for cough, hemoptysis, sputum production, shortness of breath, wheezing and stridor.   Cardiovascular: Negative for chest pain, palpitations, orthopnea, claudication, leg swelling and PND.  Gastrointestinal: Negative for heartburn, nausea, vomiting, abdominal pain, diarrhea, constipation, blood in stool and melena.  Genitourinary: Negative for dysuria, urgency, frequency, hematuria and flank pain.  Musculoskeletal: Negative for myalgias, back pain, joint pain, falls and neck pain.  Skin: Negative for itching and rash.  Neurological: Negative for dizziness, tingling, tremors, sensory change, speech change, focal weakness, seizures, loss of consciousness, weakness and headaches.  Endo/Heme/Allergies: Negative for environmental allergies and polydipsia. Does not bruise/bleed easily.  Psychiatric/Behavioral: Negative for depression, suicidal ideas, hallucinations, memory loss and substance abuse. The patient is not nervous/anxious and does not have insomnia.     As per HPI. Otherwise, a complete review of systems is negatve.  PAST MEDICAL HISTORY: Past Medical History  Diagnosis Date  . Arthritis   . Cataract   . Diabetes mellitus without complication   . GERD (gastroesophageal reflux disease)   . Hypertension   . Thyroid disease   . Hypercholesteremia   . Varicose vein of leg   . Kidney stone on right side   . Hx of migraines     from ages [age]  . Breast cancer     right side    PAST SURGICAL HISTORY: Past Surgical History  Procedure Laterality Date  . Cataract extraction w/ intraocular lens  implant, bilateral    . Tonsillectomy    . Mastectomy, partial Right  2000    lumpectomy  . Varicose vein surgery Left     laser surgery  . Simple mastectomy with axillary sentinel node biopsy Right 08/21/2014    Procedure: SIMPLE  MASTECTOMY WITH AXILLARY SENTINEL NODE BIOPSY;  Surgeon: Rochel Brome, MD;  Location: [facility] ORS;  Service: General;  Laterality: Right;   history of tonsillectomy, lumpectomy in the past.  Cataract surgery in the past. FAMILY HISTORY No family history on file. No family history of breast cancer, colon cancer. 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      ADVANCED DIRECTIVES: PATIENT  refused   HEALTH MAINTENANCE: History  Substance Use Topics  . Smoking status: Former Smoker -- 0.50 packs/day for 10 years    Quit date: 08/05/1970  . Smokeless tobacco: Not on file  . Alcohol Use: No     No Known Allergies  Current Outpatient Prescriptions  Medication Sig Dispense Refill  . acetaminophen (TYLENOL) 500 MG tablet Take 500 mg by mouth every 6 (six) hours as needed for mild pain.    Marland Kitchen levothyroxine (SYNTHROID, LEVOTHROID) 112 MCG tablet Take 112 mcg by mouth every morning.     Marland Kitchen lisinopril-hydrochlorothiazide (PRINZIDE,ZESTORETIC) 10-12.5 MG per tablet Take 10-12.5 tablets by mouth every morning.     . lovastatin (MEVACOR) 10 MG tablet Take 10 mg by mouth at bedtime.     . metFORMIN (GLUCOPHAGE) 500 MG tablet Take 500 mg by mouth every morning.     . calcium-vitamin D (OSCAL WITH D) 500-200 MG-UNIT per tablet Take 1 tablet by mouth 2 (two) times daily. 60 tablet 6  . letrozole (FEMARA) 2.5 MG tablet Take 1 tablet (2.5 mg total) by mouth daily. 30 tablet 6   No current facility-administered medications for this visit.    OBJECTIVE: Filed Vitals:   09/04/14 1219  BP: 159/81  Pulse: 57  Temp: 98.1 F (36.7 C)  Resp: 18     Body mass index is 28.71 kg/(m^2).    ECOG FS:0 - Asymptomatic  Physical Exam  Constitutional: She is oriented to person, place, and time and well-developed, well-nourished, and in no distress. No distress.  HENT:  Head: Normocephalic and atraumatic.  Right Ear: External ear normal.  Left Ear: External ear normal.  Nose: Nose normal.    Mouth/Throat: Oropharynx is clear and moist.  Eyes: Conjunctivae and EOM are normal.  Neck: Normal range of motion. No JVD present. No tracheal deviation present. No thyromegaly present.  Cardiovascular: Normal rate and regular rhythm.  Exam reveals no friction rub.   No murmur heard. Pulmonary/Chest: No stridor. No respiratory distress. She has no wheezes. She has no rales. She exhibits no tenderness.  Abdominal: She exhibits no distension and no mass. There is no tenderness. There is no rebound.  Musculoskeletal: She exhibits no edema or tenderness.  Neurological: She is alert and oriented to person, place, and time. She has normal reflexes. Gait normal. GCS score is 15.  Skin: Skin is warm and dry. No rash noted. She is not diaphoretic.  Psychiatric: Mood, memory and affect normal.   right breast: Status post mastectomy wound is healing well.  Axillary lymph nodes are not palpable.  Left breast free of masses   LAB RESULTS:     Component Value Date/Time   NA 142 08/14/2014 0903   K 4.8 08/14/2014 0903   CL 104 08/14/2014 0903   CO2 30 08/14/2014 0903   GLUCOSE 111* 08/14/2014 0903   BUN 16 08/14/2014 0903   CREATININE  0.76 08/14/2014 0903   CALCIUM 9.6 08/14/2014 0903   GFRNONAA >60 08/14/2014 0903   GFRAA >60 08/14/2014 0903    No results found for: SPEP, UPEP  No results found for: WBC, NEUTROABS, HGB, HCT, MCV, PLT    Chemistry      Component Value Date/Time   NA 142 08/14/2014 0903   K 4.8 08/14/2014 0903   CL 104 08/14/2014 0903   CO2 30 08/14/2014 0903   BUN 16 08/14/2014 0903   CREATININE 0.76 08/14/2014 0903      Component Value Date/Time   CALCIUM 9.6 08/14/2014 0903       No results found for: LABCA2  No components found for: [id number]  No results for input(s): INR in the last 168 hours.  No results found for: COLORURINE, APPEARANCEUR, LABSPEC, PHURINE, GLUCOSEU, HGBUR, BILIRUBINUR, KETONESUR, PROTEINUR, UROBILINOGEN, NITRITE,  LEUKOCYTESUR  STUDIES: Nm Sentinel Node Inj-no Rpt (breast)  08/21/2014   CLINICAL DATA: Malignant neoplasm of upper-inner quadrant of right female  breast   Sulfur colloid was injected intradermally by the nuclear medicine  technologist for breast cancer sentinel node localization.     ASSESSMENT:  1.  Carcinoma of right breast status post ultrasound-guided biopsy.  6 mm lesion (April, 2016) at 2:00 position 2.  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  And 5 years of tamoxifen 3.  Status post simple mastectomy.  Lesion remains T1b Estrogen receptor positive progesterone receptor positive HER-2/neu receptor negative  PLAN: I had detailed discussion regarding basins further planning of treatment.  Patient will not need any chemotherapy does not need any Oncotype DX as total size of the tumor is less than a centimeter.. No need for any further local radiation therapy.  We discussed possibility of using letrozole calcium and vitamin D Side effects of letrozole including bony pains.  Increased chance of osteoporosis and been discussed. Total duration of visit was 45 minutes.  50% or more time was spent in counseling patient and family regarding prognosis and options of treatment and available resources Return appointment in 6 months unless patient overlaps any side effect. Baseline bone density has been ordered.   Patient expressed understanding and was in agreement with this plan. She also understands that She can call clinic at any time with any questions, concerns, or complaints.    Breast CA   Staging form: Breast, AJCC 7th Edition     Clinical: Stage IA (T1b, N0, M0) - Marni Griffon, MD   09/04/2014 12:47 PM

## 2014-09-13 ENCOUNTER — Ambulatory Visit
Admission: RE | Admit: 2014-09-13 | Discharge: 2014-09-13 | Disposition: A | Discharge status: Home / Self Care | Payer: Medicare Other | Source: Ambulatory Visit | Attending: Oncology | Admitting: Oncology

## 2014-09-13 DIAGNOSIS — M8580 Other specified disorders of bone density and structure, unspecified site: Secondary | ICD-10-CM | POA: Diagnosis not present

## 2014-09-13 DIAGNOSIS — M899 Disorder of bone, unspecified: Secondary | ICD-10-CM | POA: Diagnosis present

## 2014-11-21 ENCOUNTER — Telehealth: Payer: Self-pay | Admitting: *Deleted

## 2014-11-21 NOTE — Telephone Encounter (Signed)
Follow up call to patient.  States she is tolerating her antihormonal therapy.  States having some "hot flashes", but that they are tolerable.  No needs at this time.  She is call if she has any questions or needs.

## 2015-03-06 ENCOUNTER — Encounter: Payer: Self-pay | Admitting: Oncology

## 2015-03-06 ENCOUNTER — Inpatient Hospital Stay: Payer: Medicare Other

## 2015-03-06 ENCOUNTER — Inpatient Hospital Stay: Payer: Medicare Other | Attending: Oncology | Admitting: Oncology

## 2015-03-06 VITALS — BP 154/79 | HR 70 | Temp 95.7°F | Wt 173.3 lb

## 2015-03-06 DIAGNOSIS — Z87891 Personal history of nicotine dependence: Secondary | ICD-10-CM | POA: Diagnosis not present

## 2015-03-06 DIAGNOSIS — Z923 Personal history of irradiation: Secondary | ICD-10-CM | POA: Diagnosis not present

## 2015-03-06 DIAGNOSIS — K219 Gastro-esophageal reflux disease without esophagitis: Secondary | ICD-10-CM | POA: Diagnosis not present

## 2015-03-06 DIAGNOSIS — Z7984 Long term (current) use of oral hypoglycemic drugs: Secondary | ICD-10-CM | POA: Insufficient documentation

## 2015-03-06 DIAGNOSIS — Z853 Personal history of malignant neoplasm of breast: Secondary | ICD-10-CM | POA: Diagnosis not present

## 2015-03-06 DIAGNOSIS — C50919 Malignant neoplasm of unspecified site of unspecified female breast: Secondary | ICD-10-CM | POA: Insufficient documentation

## 2015-03-06 DIAGNOSIS — C50911 Malignant neoplasm of unspecified site of right female breast: Secondary | ICD-10-CM | POA: Diagnosis present

## 2015-03-06 DIAGNOSIS — I10 Essential (primary) hypertension: Secondary | ICD-10-CM | POA: Insufficient documentation

## 2015-03-06 DIAGNOSIS — Z9011 Acquired absence of right breast and nipple: Secondary | ICD-10-CM

## 2015-03-06 DIAGNOSIS — E079 Disorder of thyroid, unspecified: Secondary | ICD-10-CM | POA: Diagnosis not present

## 2015-03-06 DIAGNOSIS — Z1239 Encounter for other screening for malignant neoplasm of breast: Secondary | ICD-10-CM

## 2015-03-06 DIAGNOSIS — E7800 Pure hypercholesterolemia, unspecified: Secondary | ICD-10-CM | POA: Diagnosis not present

## 2015-03-06 DIAGNOSIS — E119 Type 2 diabetes mellitus without complications: Secondary | ICD-10-CM | POA: Insufficient documentation

## 2015-03-06 DIAGNOSIS — Z170 Estrogen receptor positive status [ER+]: Secondary | ICD-10-CM | POA: Diagnosis not present

## 2015-03-06 DIAGNOSIS — Z79811 Long term (current) use of aromatase inhibitors: Secondary | ICD-10-CM

## 2015-03-06 LAB — COMPREHENSIVE METABOLIC PANEL
ALBUMIN: 4.4 g/dL (ref 3.5–5.0)
ALK PHOS: 67 U/L (ref 38–126)
ALT: 29 U/L (ref 14–54)
ANION GAP: 8 (ref 5–15)
AST: 29 U/L (ref 15–41)
BILIRUBIN TOTAL: 0.6 mg/dL (ref 0.3–1.2)
BUN: 18 mg/dL (ref 6–20)
CO2: 29 mmol/L (ref 22–32)
Calcium: 10.1 mg/dL (ref 8.9–10.3)
Chloride: 101 mmol/L (ref 101–111)
Creatinine, Ser: 0.76 mg/dL (ref 0.44–1.00)
GFR calc Af Amer: >60 mL/min (ref >60)
GLUCOSE: 98 mg/dL (ref 65–99)
Potassium: 4.3 mmol/L (ref 3.5–5.1)
Sodium: 138 mmol/L (ref 135–145)
TOTAL PROTEIN: 7.5 g/dL (ref 6.5–8.1)

## 2015-03-06 LAB — CBC WITH DIFFERENTIAL/PLATELET
Basophils Absolute: 0.1 10*3/uL (ref 0–0.1)
Basophils Relative: 1 %
Eosinophils Absolute: 0.2 10*3/uL (ref 0–0.7)
Eosinophils Relative: 3 %
HEMATOCRIT: 42.8 % (ref 35.0–47.0)
HEMOGLOBIN: 14.4 g/dL (ref 12.0–16.0)
LYMPHS ABS: 2.4 10*3/uL (ref 1.0–3.6)
LYMPHS PCT: 36 %
MCH: 27.5 pg (ref 26.0–34.0)
MCHC: 33.5 g/dL (ref 32.0–36.0)
MCV: 82.1 fL (ref 80.0–100.0)
MONO ABS: 0.6 10*3/uL (ref 0.2–0.9)
MONOS PCT: 9 %
NEUTROS PCT: 51 %
Neutro Abs: 3.4 10*3/uL (ref 1.4–6.5)
Platelets: 316 10*3/uL (ref 150–440)
RBC: 5.22 MIL/uL — ABNORMAL HIGH (ref 3.80–5.20)
RDW: 14.2 % (ref 11.5–14.5)
WBC: 6.6 10*3/uL (ref 3.6–11.0)

## 2015-03-06 MED ORDER — LETROZOLE 2.5 MG PO TABS: 2.5000 mg | ORAL_TABLET | Freq: Every day | ORAL | Status: DC

## 2015-03-06 NOTE — Progress Notes (Signed)
[place] @ [facility] Telephone:([phone]) [phone]  Fax:(336) [place]  KAELEI WHEELER OB: May 26, 1940  MR#: [id number]  ZDG#:[id number]  Patient Care Team: Sallee Lange, NP as PCP - General (Internal Medicine) Leonie Green, MD as Referring Physician (Surgery)  CHIEF COMPLAINT:  Chief Complaint  Patient presents with  . Breast Cancer   74 year old lady came today further follow-up regarding carcinoma breast.  For evaluation and further planning of treatment. Oncology History   1.  Carcinoma breast (right) at 2:00 position biopsies positive for invasive mammary carcinoma Mammogram was done in April of 2016 Estrogen receptor positive progesterone receptor positive HER-2/neu receptor negative by fish Clinically staged as T1 b N0 M0 tumor 2.  Previous history of carcinoma breast in the same breast (right) Status post mastectomy.  And 16 years ago was Tis N0 M0 tumor status post lumpectomy and radiation therapy estrogen and progesterone receptor was positive and had received 5 years of tamoxifen    2.  Patient is started letrozole from May of 2016   Oncology Flowsheet 08/21/2014 08/21/2014  ondansetron (ZOFRAN) IV - 4 mg  promethazine (PHENERGAN) IV 6.25 mg      INTERVAL HISTORY: 74 year old lady came today further follow-up regarding carcinoma breast.  For evaluation and further planning of treatment. Sep 04, 2014 Patient is here after getting mastectomy done.  Only 1 mm of invasion residual cancer was found.  Patient is gradually recovering.  Here to discuss the results of the pathology and further planning of treatment Patient has started taking letrozole from May of 2016 tolerating treatment very well.  No bony pain.  Taking calcium and vitamin D and a bone density study done.  Patient is working full-time in Journalist, newspaper in charge. Here for further follow-up and treatment consideration  REVIEW OF SYSTEMS:   ROS   GENERAL:  Feels  good.  Active.  No fevers, sweats or weight loss. PERFORMANCE STATUS (ECOG)0 HEENT:  No visual changes, runny nose, sore throat, mouth sores or tenderness. Lungs: No shortness of breath or cough.  No hemoptysis. Cardiac:  No chest pain, palpitations, orthopnea, or PND. GI:  No nausea, vomiting, diarrhea, constipation, melena or hematochezia. GU:  No urgency, frequency, dysuria, or hematuria. Musculoskeletal:  No back pain.  No joint pain.  No muscle tenderness. Extremities:  No pain or swelling. Skin:  No rashes or skin changes. Neuro:  No headache, numbness or weakness, balance or coordination issues. Endocrine:  No diabetes, thyroid issues, hot flashes or night sweats. Psych:  No mood changes, depression or anxiety. Pain:  No focal pain. Review of systems:  All other systems reviewed and found to be negative.  As per HPI. Otherwise, a complete review of systems is negatve.  PAST MEDICAL HISTORY: Past Medical History  Diagnosis Date  . Arthritis   . Cataract   . Diabetes mellitus without complication ([place])   . GERD (gastroesophageal reflux disease)   . Hypertension   . Thyroid disease   . Hypercholesteremia   . Varicose vein of leg   . Kidney stone on right side   . Hx of migraines     from ages [age]  . Breast cancer ([place])     right side    PAST SURGICAL HISTORY: Past Surgical History  Procedure Laterality Date  . Cataract extraction w/ intraocular lens  implant, bilateral    . Tonsillectomy    . Mastectomy, partial Right 2000    lumpectomy  . Varicose vein surgery  Left     laser surgery  . Simple mastectomy with axillary sentinel node biopsy Right 08/21/2014    Procedure: SIMPLE MASTECTOMY WITH AXILLARY SENTINEL NODE BIOPSY;  Surgeon: Rochel Brome, MD;  Location: [facility] ORS;  Service: General;  Laterality: Right;   history of tonsillectomy, lumpectomy in the past.  Cataract surgery in the past. FAMILY HISTORY No family history on file. No family history of breast  cancer, colon cancer. 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      ADVANCED DIRECTIVES: PATIENT  refused   HEALTH MAINTENANCE: Social History  Substance Use Topics  . Smoking status: Former Smoker -- 0.50 packs/day for 10 years    Quit date: 08/05/1970  . Smokeless tobacco: None  . Alcohol Use: No     No Known Allergies  Current Outpatient Prescriptions  Medication Sig Dispense Refill  . acetaminophen (TYLENOL) 500 MG tablet Take 500 mg by mouth every 6 (six) hours as needed for mild pain.    Marland Kitchen azithromycin (ZITHROMAX) 250 MG tablet Take 2 tablets (555m) by mouth on Day 1. Take 1 tablet (2545m by mouth on Days 2-5.    . calcium-vitamin D (OSCAL WITH D) 500-200 MG-UNIT per tablet Take 1 tablet by mouth 2 (two) times daily. 60 tablet 6  . HYDROcodone-homatropine (HYCODAN) 5-1.5 MG/5ML syrup Take by mouth.    . letrozole (FEMARA) 2.5 MG tablet Take 1 tablet (2.5 mg total) by mouth daily. 30 tablet 6  . levothyroxine (SYNTHROID, LEVOTHROID) 100 MCG tablet Take 100 mcg by mouth daily before breakfast.    . lisinopril-hydrochlorothiazide (PRINZIDE,ZESTORETIC) 10-12.5 MG per tablet Take 10-12.5 tablets by mouth every morning.     . lovastatin (MEVACOR) 10 MG tablet Take 10 mg by mouth at bedtime.     . metFORMIN (GLUCOPHAGE) 500 MG tablet Take 500 mg by mouth every morning.      No current facility-administered medications for this visit.    OBJECTIVE: Filed Vitals:   03/06/15 1114  BP: 154/79  Pulse: 70  Temp: 95.7 F (35.4 C)     Body mass index is 27.98 kg/(m^2).    ECOG FS:0 - Asymptomatic  Physical Exam right breast: Status post mastectomy wound is healing well.  Axillary lymph nodes are not palpable.  Left breast free of masses Left   breast free of masses.  Axillary lymph nodes are within normal limit. GENERAL:  Well developed, well nourished, sitting comfortably in the exam room in no acute distress. MENTAL STATUS:  Alert and oriented to person,  place and time.   RESPIRATORY:  Clear to auscultation without rales, wheezes or rhonchi. CARDIOVASCULAR:  Regular rate and rhythm without murmur, rub or gallop.  ABDOMEN:  Soft, non-tender, with active bowel sounds, and no hepatosplenomegaly.  No masses. BACK:  No CVA tenderness.  No tenderness on percussion of the back or rib cage. SKIN:  No rashes, ulcers or lesions. EXTREMITIES: No edema, no skin discoloration or tenderness.  No palpable cords. LYMPH NODES: No palpable cervical, supraclavicular, axillary or inguinal adenopathy  NEUROLOGICAL: Unremarkable. PSYCH:  Appropriate.  LAB RESULTS:     Component Value Date/Time   NA 138 03/06/2015 1041   K 4.3 03/06/2015 1041   CL 101 03/06/2015 1041   CO2 29 03/06/2015 1041   GLUCOSE 98 03/06/2015 1041   BUN 18 03/06/2015 1041   CREATININE 0.76 03/06/2015 1041   CALCIUM 10.1 03/06/2015 1041   PROT 7.5 03/06/2015 1041   ALBUMIN 4.4 03/06/2015 1041   AST  29 03/06/2015 1041   ALT 29 03/06/2015 1041   ALKPHOS 67 03/06/2015 1041   BILITOT 0.6 03/06/2015 1041   GFRNONAA >60 03/06/2015 1041   GFRAA >60 03/06/2015 1041    No results found for: SPEP, UPEP  Lab Results  Component Value Date   WBC 6.6 03/06/2015   NEUTROABS 3.4 03/06/2015   HGB 14.4 03/06/2015   HCT 42.8 03/06/2015   MCV 82.1 03/06/2015   PLT 316 03/06/2015      Chemistry      Component Value Date/Time   NA 138 03/06/2015 1041   K 4.3 03/06/2015 1041   CL 101 03/06/2015 1041   CO2 29 03/06/2015 1041   BUN 18 03/06/2015 1041   CREATININE 0.76 03/06/2015 1041      Component Value Date/Time   CALCIUM 10.1 03/06/2015 1041   ALKPHOS 67 03/06/2015 1041   AST 29 03/06/2015 1041   ALT 29 03/06/2015 1041   BILITOT 0.6 03/06/2015 1041        STUDIES: On density study in June of 2016   EXAM: DUAL X-RAY ABSORPTIOMETRY (DXA) FOR BONE MINERAL DENSITY  IMPRESSION: Dear Dr. Oliva Bustard,  Your patient Mindie Rawdon completed a BMD test on 09/13/2014  using the [place] (analysis version: 14.10) manufactured by [employer]. The following summarizes the results of our evaluation.  PATIENT BIOGRAPHICAL: Name: Lanayah, Gartley Patient ID: [id number] Birth Date: January 06, 1941 Height: 66.0 in. Gender: Female Exam Date: 09/13/2014 Weight: 171.0 lbs. Indications: Caucasian, DIABETES, High Risk Meds, History of Breast Cancer, Postmenopausal Fractures: Treatments:  ASSESSMENT:  The BMD measured at Femur Total Left is 0.770 g/cm2 with a T-score of -1.9. This patient is considered osteopenic according to [facility]) criteria. L-3 & L-4 were excluded due to degenerative changes.  Site Region Measured Measured WHO Young Adult BMD Date Age Classification T-score AP Spine L3-L4 09/13/2014 74.0 Normal -0.4 1.167 g/cm2  DualFemur Total Left 09/13/2014 74.0 Osteopenia -1.9 0.770 g/cm2  World Health Organization [facility]) criteria for post-menopausal, Caucasian Women: Normal: T-score at or above -1 SD Osteopenia: T-score between -1 and -2.5 SD Osteoporosis: T-score at or below -2.5 SD  RECOMMENDATIONS: [place] recommends that FDA-approved medical therapies be considered in postmenopausal women and men age 30 or older with a: 1. Hip or vertebral (clinical or morphometric) fracture. 2. T-score of < -2.5 at the spine or hip. 3. Ten-year fracture probability by FRAX of 3% or greater for hip fracture or 20% or greater for major osteoporotic fracture.  All treatment decisions require clinical judgment and consideration of individual patient factors, including patient preferences, co-morbidities, previous drug use, risk factors not captured in the FRAX model (e.g. falls, vitamin D deficiency, increased bone turnover, interval significant decline in bone density) and possible under - or over-estimation of fracture risk by FRAX.  All patients should ensure an  adequate intake of dietary calcium (1200 mg/d) and vitamin D (800 IU daily) unless contraindicated.  FOLLOW-UP: People with diagnosed cases of osteoporosis or at high risk for fracture should have regular bone mineral density tests. For patients eligible for Medicare, routine testing is allowed once every 2 years. The testing frequency can be increased to one year for patients who have rapidly progressing disease, those who are receiving or discontinuing medical therapy to restore bone mass, or have additional risk factors.  I have reviewed this report, and agree with the above findings.  [facility] Dear Dr. Oliva Bustard,  Your patient Evangeline Dakin completed a  FRAX assessment on 09/13/2014 using the [place] (analysis version: 14.10) manufactured by [employer]. The following summarizes the results of our evaluation.  PATIENT BIOGRAPHICAL: Name: Shelvie, Salsberry Patient ID: [id number] Birth Date: [birth year] Height: 66.0 in. Gender: Female Age: 2.0 Weight: 171.0 lbs. Ethnicity: White Exam Date: 09/13/2014  FRAX* RESULTS: (version: 3.5) 10-year Probability of Fracture1 Major Osteoporotic Fracture2 Hip Fracture 11.1% 2.1% Population: [place] (Caucasian) Risk Factors: None  Based on Femur (Right) Neck BMD  1 -The 10-year probability of fracture may be lower than reported if the patient has received treatment. 2 -Major Osteoporotic Fracture: Clinical Spine, Forearm, Hip or Shoulder  *FRAX is a Materials engineer of the [employer] for Metabolic Bone Disease, a [place] (WHO) [facility].  ASSESSMENT: The probability of a major osteoporotic fracture is 11.1% within the next ten years.    ASSESSMENT:  1.  Carcinoma of right breast status post ultrasound-guided biopsy.  6 mm lesion (April, 2016) at 2:00 position 2.  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  And 5 years of tamoxifen 3.  Status post simple mastectomy.  Lesion remains T1b Estrogen receptor positive progesterone receptor positive HER-2/neu receptor negative  PLAN: Continue letrozole at present time.  Continue calcium and vitamin D.  Repeat bone density study in 2 years or before if patient develops any fractures or any bony pains. Repeat mammogram   Patient expressed understanding and was in agreement with this plan. She also understands that She can call clinic at any time with any questions, concerns, or complaints.    Breast CA   Staging form: Breast, AJCC 7th Edition     Clinical: Stage IA (T1b, N0, M0) - Marni Griffon, MD   03/06/2015 11:21 AM

## 2015-07-30 ENCOUNTER — Ambulatory Visit
Admission: RE | Admit: 2015-07-30 | Discharge: 2015-07-30 | Disposition: A | Discharge status: Home / Self Care | Payer: Medicare Other | Source: Ambulatory Visit | Attending: Oncology | Admitting: Oncology

## 2015-07-30 ENCOUNTER — Other Ambulatory Visit: Payer: Self-pay | Admitting: Oncology

## 2015-07-30 DIAGNOSIS — Z1239 Encounter for other screening for malignant neoplasm of breast: Secondary | ICD-10-CM

## 2015-07-30 DIAGNOSIS — Z1231 Encounter for screening mammogram for malignant neoplasm of breast: Secondary | ICD-10-CM | POA: Diagnosis not present

## 2015-07-30 DIAGNOSIS — Z853 Personal history of malignant neoplasm of breast: Secondary | ICD-10-CM | POA: Diagnosis not present

## 2015-07-30 DIAGNOSIS — Z9011 Acquired absence of right breast and nipple: Secondary | ICD-10-CM | POA: Insufficient documentation

## 2015-09-03 ENCOUNTER — Ambulatory Visit: Payer: Medicare Other | Admitting: Family Medicine

## 2015-09-03 ENCOUNTER — Other Ambulatory Visit: Payer: Medicare Other

## 2015-09-11 ENCOUNTER — Inpatient Hospital Stay (HOSPITAL_BASED_OUTPATIENT_CLINIC_OR_DEPARTMENT_OTHER): Payer: Medicare Other | Admitting: Family Medicine

## 2015-09-11 ENCOUNTER — Inpatient Hospital Stay: Payer: Medicare Other | Attending: Family Medicine

## 2015-09-11 ENCOUNTER — Encounter: Payer: Self-pay | Admitting: Family Medicine

## 2015-09-11 VITALS — BP 177/70 | HR 55 | Temp 96.8°F | Wt 176.8 lb

## 2015-09-11 DIAGNOSIS — E7800 Pure hypercholesterolemia, unspecified: Secondary | ICD-10-CM | POA: Insufficient documentation

## 2015-09-11 DIAGNOSIS — Z9011 Acquired absence of right breast and nipple: Secondary | ICD-10-CM | POA: Diagnosis not present

## 2015-09-11 DIAGNOSIS — Z79811 Long term (current) use of aromatase inhibitors: Secondary | ICD-10-CM | POA: Diagnosis not present

## 2015-09-11 DIAGNOSIS — Z79899 Other long term (current) drug therapy: Secondary | ICD-10-CM

## 2015-09-11 DIAGNOSIS — I10 Essential (primary) hypertension: Secondary | ICD-10-CM | POA: Insufficient documentation

## 2015-09-11 DIAGNOSIS — Z170 Estrogen receptor positive status [ER+]: Secondary | ICD-10-CM

## 2015-09-11 DIAGNOSIS — Z7984 Long term (current) use of oral hypoglycemic drugs: Secondary | ICD-10-CM | POA: Insufficient documentation

## 2015-09-11 DIAGNOSIS — Z853 Personal history of malignant neoplasm of breast: Secondary | ICD-10-CM

## 2015-09-11 DIAGNOSIS — E079 Disorder of thyroid, unspecified: Secondary | ICD-10-CM | POA: Insufficient documentation

## 2015-09-11 DIAGNOSIS — C50411 Malignant neoplasm of upper-outer quadrant of right female breast: Secondary | ICD-10-CM

## 2015-09-11 DIAGNOSIS — K219 Gastro-esophageal reflux disease without esophagitis: Secondary | ICD-10-CM | POA: Insufficient documentation

## 2015-09-11 DIAGNOSIS — E119 Type 2 diabetes mellitus without complications: Secondary | ICD-10-CM | POA: Diagnosis not present

## 2015-09-11 DIAGNOSIS — M1990 Unspecified osteoarthritis, unspecified site: Secondary | ICD-10-CM | POA: Diagnosis not present

## 2015-09-11 DIAGNOSIS — C50911 Malignant neoplasm of unspecified site of right female breast: Secondary | ICD-10-CM

## 2015-09-11 DIAGNOSIS — Z87891 Personal history of nicotine dependence: Secondary | ICD-10-CM | POA: Diagnosis not present

## 2015-09-11 DIAGNOSIS — Z87442 Personal history of urinary calculi: Secondary | ICD-10-CM | POA: Insufficient documentation

## 2015-09-11 DIAGNOSIS — Z923 Personal history of irradiation: Secondary | ICD-10-CM | POA: Insufficient documentation

## 2015-09-11 LAB — CBC WITH DIFFERENTIAL/PLATELET
Basophils Absolute: 0.1 10*3/uL (ref 0–0.1)
Basophils Relative: 1 %
Eosinophils Absolute: 0.2 10*3/uL (ref 0–0.7)
Eosinophils Relative: 4 %
HEMATOCRIT: 41.5 % (ref 35.0–47.0)
Hemoglobin: 14.2 g/dL (ref 12.0–16.0)
LYMPHS ABS: 1.9 10*3/uL (ref 1.0–3.6)
LYMPHS PCT: 33 %
MCH: 28.1 pg (ref 26.0–34.0)
MCHC: 34.1 g/dL (ref 32.0–36.0)
MCV: 82.3 fL (ref 80.0–100.0)
Monocytes Absolute: 0.5 10*3/uL (ref 0.2–0.9)
Monocytes Relative: 8 %
NEUTROS ABS: 3.2 10*3/uL (ref 1.4–6.5)
NEUTROS PCT: 54 %
Platelets: 281 10*3/uL (ref 150–440)
RBC: 5.04 MIL/uL (ref 3.80–5.20)
RDW: 13.8 % (ref 11.5–14.5)
WBC: 5.9 10*3/uL (ref 3.6–11.0)

## 2015-09-11 LAB — COMPREHENSIVE METABOLIC PANEL
ALK PHOS: 72 U/L (ref 38–126)
ALT: 24 U/L (ref 14–54)
AST: 26 U/L (ref 15–41)
Albumin: 4.3 g/dL (ref 3.5–5.0)
Anion gap: 6 (ref 5–15)
BUN: 14 mg/dL (ref 6–20)
CALCIUM: 9.6 mg/dL (ref 8.9–10.3)
CO2: 31 mmol/L (ref 22–32)
Chloride: 103 mmol/L (ref 101–111)
Creatinine, Ser: 0.75 mg/dL (ref 0.44–1.00)
GFR calc non Af Amer: >60 mL/min (ref >60)
Glucose, Bld: 112 mg/dL — ABNORMAL HIGH (ref 65–99)
Potassium: 4 mmol/L (ref 3.5–5.1)
Sodium: 140 mmol/L (ref 135–145)
Total Bilirubin: 0.8 mg/dL (ref 0.3–1.2)
Total Protein: 7.4 g/dL (ref 6.5–8.1)

## 2015-09-11 NOTE — Progress Notes (Signed)
[place]  [phone]  [phone]     Sydney Parks DOB: September 16, 1940  MR#: [id number]  EHU#:[id number]  Patient Care Team: Sallee Lange, NP as PCP - General (Internal Medicine) Leonie Green, MD as Referring Physician (Surgery)  CHIEF COMPLAINT:  Chief Complaint  Patient presents with  . Breast Cancer    INTERVAL HISTORY:  Patient is here for further follow-up regarding carcinoma of the right breast. 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 Patient underwent a right mastectomy with Dr. Rochel Brome. She is currently on letrozole daily with vitamin D and calcium. Her most recent unilateral left mammogram was performed in April and reported as BI-RADS 1, negative. She also had a DEXA scan performed in June 2016 with osteopenic features. Patient overall reports tolerating her letrozole fairly well but has some intermittent hot flashes as well as intermittent joint discomfort. She continues to take ibuprofen alternating with Tylenol for discomfort and reports that they resolve.  REVIEW OF SYSTEMS:   Review of Systems  Constitutional: Negative for fever, chills, weight loss, malaise/fatigue and diaphoresis.  HENT: Negative.   Eyes: Negative.   Respiratory: Negative for cough, hemoptysis, sputum production, shortness of breath and wheezing.   Cardiovascular: Negative for chest pain, palpitations, orthopnea, claudication, leg swelling and PND.  Gastrointestinal: Negative for heartburn, nausea, vomiting, abdominal pain, diarrhea, constipation, blood in stool and melena.  Genitourinary: Negative.   Musculoskeletal: Positive for joint pain.       Occasional  Skin: Negative.   Neurological: Negative for dizziness, tingling, focal weakness, seizures and weakness.  Endo/Heme/Allergies: Does not bruise/bleed easily.  Psychiatric/Behavioral: Negative for depression. The patient is not  nervous/anxious and does not have insomnia.     As per HPI. Otherwise, a complete review of systems is negatve.  ONCOLOGY HISTORY: Oncology History   1.  Carcinoma breast (right) at 2:00 position biopsies positive for invasive mammary carcinoma Mammogram was done in April of 2016 Estrogen receptor positive progesterone receptor positive HER-2/neu receptor negative by fish Clinically staged as T1 b N0 M0 tumor 2.  Previous history of carcinoma breast in the same breast (right) Status post mastectomy. 3. Started letrozole in May 2016.  And 16 years ago was Tis N0 M0 tumor status post lumpectomy and radiation therapy estrogen and progesterone receptor was positive and had received 5 years of tamoxifen     Breast CA ([place]) (Resolved)   08/08/2014 Initial Diagnosis Breast CA    Breast cancer ([place])   08/21/2014 Initial Diagnosis Breast cancer    PAST MEDICAL HISTORY: Past Medical History  Diagnosis Date  . Arthritis   . Cataract   . Diabetes mellitus without complication ([place])   . GERD (gastroesophageal reflux disease)   . Hypertension   . Thyroid disease   . Hypercholesteremia   . Varicose vein of leg   . Kidney stone on right side   . Hx of migraines     from ages [age]  . Breast cancer ([place])     right side    PAST SURGICAL HISTORY: Past Surgical History  Procedure Laterality Date  . Cataract extraction w/ intraocular lens  implant, bilateral    . Tonsillectomy    . Varicose vein surgery Left     laser surgery  . Simple mastectomy with axillary sentinel node biopsy Right 08/21/2014    Procedure: SIMPLE MASTECTOMY WITH AXILLARY SENTINEL NODE BIOPSY;  Surgeon: Rochel Brome, MD;  Location: [facility]  ORS;  Service: General;  Laterality: Right;  . Mastectomy, partial Right 2000    lumpectomy  . Mastectomy Right 2016    + complete mastectomy    FAMILY HISTORY History reviewed. No pertinent family history.  GYNECOLOGIC HISTORY:  No LMP recorded. Patient is postmenopausal.      ADVANCED DIRECTIVES:    HEALTH MAINTENANCE: Social History  Substance Use Topics  . Smoking status: Former Smoker -- 0.50 packs/day for 10 years    Quit date: 08/05/1970  . Smokeless tobacco: None  . Alcohol Use: No     Colonoscopy:  PAP:  Bone density:  Mammogram:  No Known Allergies  Current Outpatient Prescriptions  Medication Sig Dispense Refill  . acetaminophen (TYLENOL) 500 MG tablet Take 500 mg by mouth every 6 (six) hours as needed for mild pain.    . calcium-vitamin D (OSCAL WITH D) 500-200 MG-UNIT per tablet Take 1 tablet by mouth 2 (two) times daily. 60 tablet 6  . letrozole (FEMARA) 2.5 MG tablet Take 1 tablet (2.5 mg total) by mouth daily. 30 tablet 6  . levothyroxine (SYNTHROID, LEVOTHROID) 100 MCG tablet Take 100 mcg by mouth daily before breakfast.    . lisinopril-hydrochlorothiazide (PRINZIDE,ZESTORETIC) 10-12.5 MG per tablet Take 10-12.5 tablets by mouth every morning.     . lovastatin (MEVACOR) 10 MG tablet Take 10 mg by mouth at bedtime.     . metFORMIN (GLUCOPHAGE) 500 MG tablet Take 500 mg by mouth every morning.     Marland Kitchen azithromycin (ZITHROMAX) 250 MG tablet Reported on 09/11/2015    . HYDROcodone-homatropine (HYCODAN) 5-1.5 MG/5ML syrup Take by mouth. Reported on 09/11/2015     No current facility-administered medications for this visit.    OBJECTIVE: BP 177/70 mmHg  Pulse 55  Temp(Src) 96.8 F (36 C) (Tympanic)  Wt 176 lb 12.9 oz (80.2 kg)   Body mass index is 28.55 kg/(m^2).    ECOG FS:0 - Asymptomatic  General: Well-developed, well-nourished, no acute distress. Eyes: Pink conjunctiva, anicteric sclera. HEENT: Normocephalic, moist mucous membranes, clear oropharnyx. Lungs: Clear to auscultation bilaterally. Heart: Regular rate and rhythm. No rubs, murmurs, or gallops. Abdomen: Soft, nontender, nondistended. No organomegaly noted, normoactive bowel sounds. Breast: Deferred as patient recently had a breast exam with Dr. Tamala Julian approximately 1  month ago. Musculoskeletal: No edema, cyanosis, or clubbing. Neuro: Alert, answering all questions appropriately. Cranial nerves grossly intact. Skin: No rashes or petechiae noted. Psych: Normal affect. Lymphatics: No cervical, clavicular LAD.   LAB RESULTS:  Appointment on 09/11/2015  Component Date Value Ref Range Status  . WBC 09/11/2015 5.9  3.6 - 11.0 K/uL Final  . RBC 09/11/2015 5.04  3.80 - 5.20 MIL/uL Final  . Hemoglobin 09/11/2015 14.2  12.0 - 16.0 g/dL Final  . HCT 09/11/2015 41.5  35.0 - 47.0 % Final  . MCV 09/11/2015 82.3  80.0 - 100.0 fL Final  . MCH 09/11/2015 28.1  26.0 - 34.0 pg Final  . MCHC 09/11/2015 34.1  32.0 - 36.0 g/dL Final  . RDW 09/11/2015 13.8  11.5 - 14.5 % Final  . Platelets 09/11/2015 281  150 - 440 K/uL Final  . Neutrophils Relative % 09/11/2015 54   Final  . Neutro Abs 09/11/2015 3.2  1.4 - 6.5 K/uL Final  . Lymphocytes Relative 09/11/2015 33   Final  . Lymphs Abs 09/11/2015 1.9  1.0 - 3.6 K/uL Final  . Monocytes Relative 09/11/2015 8   Final  . Monocytes Absolute 09/11/2015 0.5  0.2 - 0.9 K/uL Final  .  Eosinophils Relative 09/11/2015 4   Final  . Eosinophils Absolute 09/11/2015 0.2  0 - 0.7 K/uL Final  . Basophils Relative 09/11/2015 1   Final  . Basophils Absolute 09/11/2015 0.1  0 - 0.1 K/uL Final  . Sodium 09/11/2015 140  135 - 145 mmol/L Final  . Potassium 09/11/2015 4.0  3.5 - 5.1 mmol/L Final  . Chloride 09/11/2015 103  101 - 111 mmol/L Final  . CO2 09/11/2015 31  22 - 32 mmol/L Final  . Glucose, Bld 09/11/2015 112* 65 - 99 mg/dL Final  . BUN 09/11/2015 14  6 - 20 mg/dL Final  . Creatinine, Ser 09/11/2015 0.75  0.44 - 1.00 mg/dL Final  . Calcium 09/11/2015 9.6  8.9 - 10.3 mg/dL Final  . Total Protein 09/11/2015 7.4  6.5 - 8.1 g/dL Final  . Albumin 09/11/2015 4.3  3.5 - 5.0 g/dL Final  . AST 09/11/2015 26  15 - 41 U/L Final  . ALT 09/11/2015 24  14 - 54 U/L Final  . Alkaline Phosphatase 09/11/2015 72  38 - 126 U/L Final  . Total  Bilirubin 09/11/2015 0.8  0.3 - 1.2 mg/dL Final  . GFR calc non Af Amer 09/11/2015 >60  >60 mL/min Final  . GFR calc Af Amer 09/11/2015 >60  >60 mL/min Final   Comment: (NOTE) The eGFR has been calculated using the CKD EPI equation. This calculation has not been validated in all clinical situations. eGFR's persistently <60 mL/min signify possible Chronic Kidney Disease.   . Anion gap 09/11/2015 6  5 - 15 Final    STUDIES: No results found.  ASSESSMENT:  Carcinoma of right breast, stage IA, T1b N0 M0, ER/PR positive HER-2/neu negative. History of DCIS in the right breast, approximately 16 years ago.Marland Kitchen  PLAN:   1. Right breast cancer. Patient is status post simple mastectomy of the right breast. She did not require chemotherapy or radiation. Clinically there is no evidence of recurrent or progressive disease. She is currently tolerating letrozole, calcium, and vitamin D very well. Her most recent mammogram in April 2017 was reported as BI-RADS 1, negative. Her most recent DEXA scan was performed in June 2016 and showed some osteopenic features. She continues to have some intermittent joint discomfort but states she is able to live with it and it does not affect her daily. Also has some mild hot flashes. 2. History of DCIS of the right breast as well. Patient underwent lumpectomy, radiation therapy, as well as completion of 5 years of tamoxifen.  Patient is asking if she needs to continue with routine follow-up with oncologist. She is concerned as she sees her primary care provider every 3 months with lab work as well as her Psychologist, sport and exercise. Discussed with patient in cc and guidelines that mandates every 6-12 month follow-up. She would like to discuss with her PCP, if PCP will write prescriptions for letrozole she would like to stop coming to oncology. Advised patient that we would make a one-year follow-up appointment for her and she is in agreement with this. If PCP agrees to follow and continue with  letrozole prescriptions then patient can call and cancel appointment.  Patient expressed understanding and was in agreement with this plan. She also understands that She can call clinic at any time with any questions, concerns, or complaints.   Dr. Oliva Bustard was available for consultation and review of plan of care for this patient.  Breast cancer [facility])   Staging form: Breast, AJCC 7th Edition  Clinical: Stage IA (T1b, N0, M0) - Signed by Forest Gleason, MD on 09/04/2014   Evlyn Kanner, NP   09/11/2015 11:13 AM

## 2015-09-23 ENCOUNTER — Ambulatory Visit: Payer: Medicare Other

## 2015-09-23 ENCOUNTER — Ambulatory Visit
Admission: EM | Admit: 2015-09-23 | Discharge: 2015-09-23 | Disposition: A | Discharge status: Home / Self Care | Payer: Medicare Other | Attending: Family Medicine | Admitting: Family Medicine

## 2015-09-23 DIAGNOSIS — L03031 Cellulitis of right toe: Secondary | ICD-10-CM | POA: Diagnosis not present

## 2015-09-23 DIAGNOSIS — M10071 Idiopathic gout, right ankle and foot: Secondary | ICD-10-CM | POA: Insufficient documentation

## 2015-09-23 DIAGNOSIS — B353 Tinea pedis: Secondary | ICD-10-CM | POA: Insufficient documentation

## 2015-09-23 DIAGNOSIS — M79671 Pain in right foot: Secondary | ICD-10-CM | POA: Diagnosis present

## 2015-09-23 MED ORDER — INDOMETHACIN 50 MG PO CAPS: 50.0000 mg | ORAL_CAPSULE | Freq: Three times a day (TID) | ORAL | Status: DC

## 2015-09-23 MED ORDER — CEPHALEXIN 500 MG PO CAPS: 500.0000 mg | ORAL_CAPSULE | Freq: Three times a day (TID) | ORAL | Status: DC

## 2015-09-23 NOTE — Discharge Instructions (Signed)
Gout Gout is an inflammatory arthritis caused by a buildup of uric acid crystals in the joints. Uric acid is a chemical that is normally present in the blood. When the level of uric acid in the blood is too high it can form crystals that deposit in your joints and tissues. This causes joint redness, soreness, and swelling (inflammation). Repeat attacks are common. Over time, uric acid crystals can form into masses (tophi) near a joint, destroying bone and causing disfigurement. Gout is treatable and often preventable. CAUSES  The disease begins with elevated levels of uric acid in the blood. Uric acid is produced by your body when it breaks down a naturally found substance called purines. Certain foods you eat, such as meats and fish, contain high amounts of purines. Causes of an elevated uric acid level include:  Being passed down from parent to child (heredity).  Diseases that cause increased uric acid production (such as obesity, psoriasis, and certain cancers).  Excessive alcohol use.  Diet, especially diets rich in meat and seafood.  Medicines, including certain cancer-fighting medicines (chemotherapy), water pills (diuretics), and aspirin.  Chronic kidney disease. The kidneys are no longer able to remove uric acid well.  Problems with metabolism. Conditions strongly associated with gout include:  Obesity.  High blood pressure.  High cholesterol.  Diabetes. Not everyone with elevated uric acid levels gets gout. It is not understood why some people get gout and others do not. Surgery, joint injury, and eating too much of certain foods are some of the factors that can lead to gout attacks. SYMPTOMS   An attack of gout comes on quickly. It causes intense pain with redness, swelling, and warmth in a joint.  Fever can occur.  Often, only one joint is involved. Certain joints are more commonly involved:  Base of the big toe.  Knee.  Ankle.  Wrist.  Finger. Without  treatment, an attack usually goes away in a few days to weeks. Between attacks, you usually will not have symptoms, which is different from many other forms of arthritis. DIAGNOSIS  Your caregiver will suspect gout based on your symptoms and exam. In some cases, tests may be recommended. The tests may include:  Blood tests.  Urine tests.  X-rays.  Joint fluid exam. This exam requires a needle to remove fluid from the joint (arthrocentesis). Using a microscope, gout is confirmed when uric acid crystals are seen in the joint fluid. TREATMENT  There are two phases to gout treatment: treating the sudden onset (acute) attack and preventing attacks (prophylaxis).  Treatment of an Acute Attack.  Medicines are used. These include anti-inflammatory medicines or steroid medicines.  An injection of steroid medicine into the affected joint is sometimes necessary.  The painful joint is rested. Movement can worsen the arthritis.  You may use warm or cold treatments on painful joints, depending which works best for you.  Treatment to Prevent Attacks.  If you suffer from frequent gout attacks, your caregiver may advise preventive medicine. These medicines are started after the acute attack subsides. These medicines either help your kidneys eliminate uric acid from your body or decrease your uric acid production. You may need to stay on these medicines for a very long time.  The early phase of treatment with preventive medicine can be associated with an increase in acute gout attacks. For this reason, during the first few months of treatment, your caregiver may also advise you to take medicines usually used for acute gout treatment. Be sure you  understand your caregiver's directions. Your caregiver may make several adjustments to your medicine dose before these medicines are effective.  Discuss dietary treatment with your caregiver or dietitian. Alcohol and drinks high in sugar and fructose and foods  such as meat, poultry, and seafood can increase uric acid levels. Your caregiver or dietitian can advise you on drinks and foods that should be limited. HOME CARE INSTRUCTIONS   Do not take aspirin to relieve pain. This raises uric acid levels.  Only take over-the-counter or prescription medicines for pain, discomfort, or fever as directed by your caregiver.  Rest the joint as much as possible. When in bed, keep sheets and blankets off painful areas.  Keep the affected joint raised (elevated).  Apply warm or cold treatments to painful joints. Use of warm or cold treatments depends on which works best for you.  Use crutches if the painful joint is in your leg.  Drink enough fluids to keep your urine clear or pale yellow. This helps your body get rid of uric acid. Limit alcohol, sugary drinks, and fructose drinks.  Follow your dietary instructions. Pay careful attention to the amount of protein you eat. Your daily diet should emphasize fruits, vegetables, whole grains, and fat-free or low-fat milk products. Discuss the use of coffee, vitamin C, and cherries with your caregiver or dietitian. These may be helpful in lowering uric acid levels.  Maintain a healthy body weight. SEEK MEDICAL CARE IF:   You develop diarrhea, vomiting, or any side effects from medicines.  You do not feel better in 24 hours, or you are getting worse. SEEK IMMEDIATE MEDICAL CARE IF:   Your joint becomes suddenly more tender, and you have chills or a fever. MAKE SURE YOU:   Understand these instructions.  Will watch your condition.  Will get help right away if you are not doing well or get worse.   This information is not intended to replace advice given to you by your health care provider. Make sure you discuss any questions you have with your health care provider.   Document Released: 03/20/2000 Document Revised: 04/13/2014 Document Reviewed: 11/04/2011 Elsevier Interactive Patient Education 2016  Elsevier Inc[employer]  Cellulitis Cellulitis is an infection of the skin and the tissue beneath it. The infected area is usually red and tender. Cellulitis occurs most often in the arms and lower legs.  CAUSES  Cellulitis is caused by bacteria that enter the skin through cracks or cuts in the skin. The most common types of bacteria that cause cellulitis are staphylococci and streptococci. SIGNS AND SYMPTOMS   Redness and warmth.  Swelling.  Tenderness or pain.  Fever. DIAGNOSIS  Your health care provider can usually determine what is wrong based on a physical exam. Blood tests may also be done. TREATMENT  Treatment usually involves taking an antibiotic medicine. HOME CARE INSTRUCTIONS   Take your antibiotic medicine as directed by your health care provider. Finish the antibiotic even if you start to feel better.  Keep the infected arm or leg elevated to reduce swelling.  Apply a warm cloth to the affected area up to 4 times per day to relieve pain.  Take medicines only as directed by your health care provider.  Keep all follow-up visits as directed by your health care provider. SEEK MEDICAL CARE IF:   You notice red streaks coming from the infected area.  Your red area gets larger or turns dark in color.  Your bone or joint underneath the infected area becomes painful after  the skin has healed.  Your infection returns in the same area or another area.  You notice a swollen bump in the infected area.  You develop new symptoms.  You have a fever. SEEK IMMEDIATE MEDICAL CARE IF:   You feel very sleepy.  You develop vomiting or diarrhea.  You have a general ill feeling (malaise) with muscle aches and pains.   This information is not intended to replace advice given to you by your health care provider. Make sure you discuss any questions you have with your health care provider.   Document Released: 12/31/2004 Document Revised: 12/12/2014 Document Reviewed:  06/08/2011 Elsevier Interactive Patient Education [employer].

## 2015-09-23 NOTE — ED Notes (Signed)
Patient states she dropped a 2lb weight on top of her right foot on Thursday, and since then the pain has moved down to the side of her great toe on her right foot. It is swollen, red, and tender to touch.

## 2015-09-23 NOTE — ED Provider Notes (Signed)
CSN: [id number]     Arrival date & time 09/23/15  1810 History   First MD Initiated Contact with Patient 09/23/15 1951     Chief Complaint  Patient presents with  . Foot Pain    Right Foot Pain   (Consider location/radiation/quality/duration/timing/severity/associated sxs/prior Treatment) HPI Comments: 75 yo female with a  4 days h/o right big toe pain, swelling and redness. Also states about 3 weeks ago she dropped a 2.5lb weight on top of her foot. Denies any fevers, chills or drainage.  The history is provided by the patient.    Past Medical History  Diagnosis Date  . Arthritis   . Cataract   . Diabetes mellitus without complication ([place])   . GERD (gastroesophageal reflux disease)   . Hypertension   . Thyroid disease   . Hypercholesteremia   . Varicose vein of leg   . Kidney stone on right side   . Hx of migraines     from ages [age]  . Breast cancer ([place])     right side   Past Surgical History  Procedure Laterality Date  . Cataract extraction w/ intraocular lens  implant, bilateral    . Tonsillectomy    . Varicose vein surgery Left     laser surgery  . Simple mastectomy with axillary sentinel node biopsy Right 08/21/2014    Procedure: SIMPLE MASTECTOMY WITH AXILLARY SENTINEL NODE BIOPSY;  Surgeon: Rochel Brome, MD;  Location: [facility] ORS;  Service: General;  Laterality: Right;  . Mastectomy, partial Right 2000    lumpectomy  . Mastectomy Right 2016    + complete mastectomy   History reviewed. No pertinent family history. Social History  Substance Use Topics  . Smoking status: Former Smoker -- 0.50 packs/day for 10 years    Quit date: 08/05/1970  . Smokeless tobacco: None  . Alcohol Use: No   OB History    No data available     Review of Systems  Allergies  Review of patient's allergies indicates no known allergies.  Home Medications   Prior to Admission medications   Medication Sig Start Date End Date Taking? Authorizing Provider  acetaminophen  (TYLENOL) 500 MG tablet Take 500 mg by mouth every 6 (six) hours as needed for mild pain.   Yes Historical Provider, MD  calcium-vitamin D (OSCAL WITH D) 500-200 MG-UNIT per tablet Take 1 tablet by mouth 2 (two) times daily. 09/04/14  Yes Forest Gleason, MD  letrozole (FEMARA) 2.5 MG tablet Take 1 tablet (2.5 mg total) by mouth daily. 03/06/15  Yes Forest Gleason, MD  levothyroxine (SYNTHROID, LEVOTHROID) 100 MCG tablet Take 100 mcg by mouth daily before breakfast.   Yes Historical Provider, MD  lisinopril-hydrochlorothiazide (PRINZIDE,ZESTORETIC) 10-12.5 MG per tablet Take 10-12.5 tablets by mouth every morning.  07/03/14  Yes Historical Provider, MD  lovastatin (MEVACOR) 10 MG tablet Take 10 mg by mouth at bedtime.  07/03/14  Yes Historical Provider, MD  metFORMIN (GLUCOPHAGE) 500 MG tablet Take 500 mg by mouth every morning.  07/03/14  Yes Historical Provider, MD  azithromycin (ZITHROMAX) 250 MG tablet Reported on 09/11/2015 03/05/15   Historical Provider, MD  cephALEXin (KEFLEX) 500 MG capsule Take 1 capsule (500 mg total) by mouth 3 (three) times daily. 09/23/15   Norval Gable, MD  HYDROcodone-homatropine [facility]) 5-1.5 MG/5ML syrup Take by mouth. Reported on 09/11/2015 03/05/15   Historical Provider, MD  indomethacin (INDOCIN) 50 MG capsule Take 1 capsule (50 mg total) by mouth 3 (three) times daily with meals. 09/23/15  Norval Gable, MD   Meds Ordered and Administered this Visit  Medications - No data to display  BP 155/59 mmHg  Pulse 73  Temp(Src) 98.4 F (36.9 C) (Oral)  Resp 16  Ht 5\' 7"  (1.702 m)  Wt 175 lb (79.379 kg)  BMI 27.40 kg/m2  SpO2 99% No data found.   Physical Exam  Constitutional: She appears well-developed and well-nourished. No distress.  Musculoskeletal:       Right foot: There is tenderness and bony tenderness. There is normal range of motion, no swelling, normal capillary refill, no crepitus, no deformity and no laceration.  Right big toe MTP joint with tenderness to  palpation and surrounding skin blanchable erythema, warmth, and tenderness to palpation; no drainage  Skin: Rash noted. She is not diaphoretic. There is erythema.  Scaly, white rash between the toes of right foot with skin breakdown, cracked skin; no drainage  Nursing note and vitals reviewed.   ED Course  Procedures (including critical care time)  Labs Review Labs Reviewed - No data to display  Imaging Review Dg Foot Complete Right  09/23/2015  CLINICAL DATA:  Weight dropped on right foot 1 month ago. Redness and swelling of the metatarsals in the great toe. EXAM: RIGHT FOOT COMPLETE - 3+ VIEW COMPARISON:  None. FINDINGS: No evidence of fracture or dislocation. There is soft tissue swelling surrounding the MTP joint of the great toe, nonspecific. IMPRESSION: No evidence of fracture or dislocation. Nonspecific soft tissue swelling in the region of the MTP joint of the great toe. Electronically Signed   By: Nelson Chimes M.D.   On: 09/23/2015 19:42   Dg Toe Great Right  09/23/2015  CLINICAL DATA:  Dropped weight on foot. Pain and swelling of the great toe. EXAM: RIGHT GREAT TOE COMPARISON:  Foot radiographs same day FINDINGS: No evidence of fracture. There is some soft tissue swelling in the region of the MTP joint. There is nail and nail bed deformity, which may not be traumatic. IMPRESSION: No fracture or dislocation seen. Soft tissue swelling in the region of the MTP joint. Nail/ nail bed deformity.  This may be traumatic or nontraumatic. Electronically Signed   By: Nelson Chimes M.D.   On: 09/23/2015 19:44     Visual Acuity Review  Right Eye Distance:   Left Eye Distance:   Bilateral Distance:    Right Eye Near:   Left Eye Near:    Bilateral Near:         MDM   1. Cellulitis of toe of right foot   2. Acute idiopathic gout involving toe of right foot   3. Tinea pedis of right foot    Discharge Medication List as of 09/23/2015  8:08 PM    START taking these medications    Details  cephALEXin (KEFLEX) 500 MG capsule Take 1 capsule (500 mg total) by mouth 3 (three) times daily., Starting 09/23/2015, Until Discontinued, Normal    indomethacin (INDOCIN) 50 MG capsule Take 1 capsule (50 mg total) by mouth 3 (three) times daily with meals., Starting 09/23/2015, Until Discontinued, Normal       1. x-ray results and diagnosis reviewed with patient 2. rx as per orders above; reviewed possible side effects, interactions, risks and benefits  3. Recommend supportive treatment with otc antifungal powder, otc analgesics 4. Follow-up prn if symptoms worsen or don't improve    Norval Gable, MD 09/23/15 2149

## 2016-07-09 ENCOUNTER — Other Ambulatory Visit: Payer: Self-pay | Admitting: Nurse Practitioner

## 2016-07-09 DIAGNOSIS — Z1231 Encounter for screening mammogram for malignant neoplasm of breast: Secondary | ICD-10-CM

## 2016-07-09 DIAGNOSIS — Z853 Personal history of malignant neoplasm of breast: Secondary | ICD-10-CM

## 2016-07-19 IMAGING — MG MM DIGITAL SCREENING BILAT W/ TOMO W/ CAD
9 of 20 series · 9 of 40 positions shown · non-contrast
Comparison: Previous exam(s).

CLINICAL DATA: Screening.

EXAM:
DIGITAL SCREENING BILATERAL MAMMOGRAM WITH 3D TOMO WITH CAD

[R CC synth-2D]
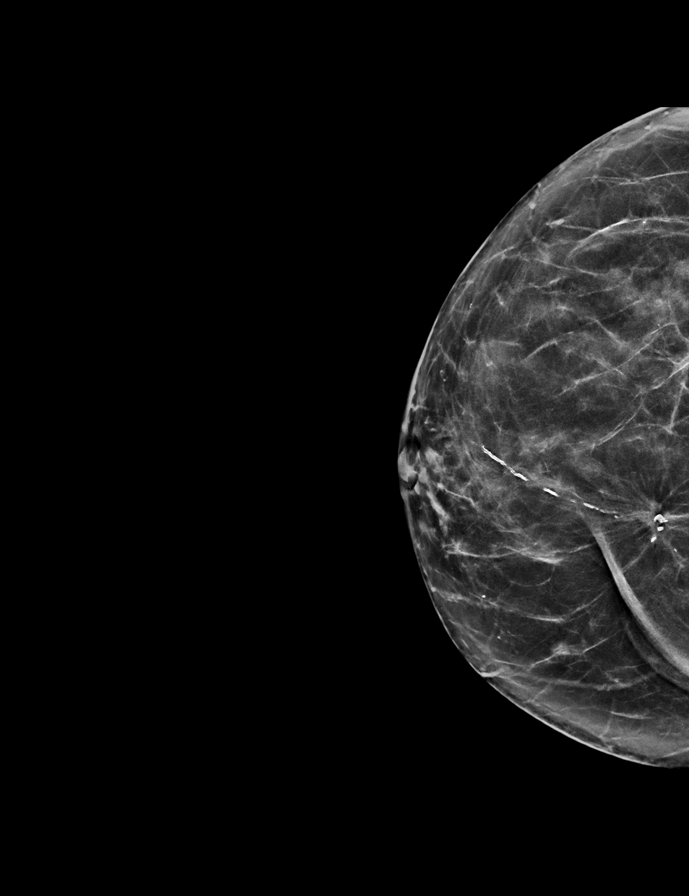

[L MLO synth-2D (1 of 2)]
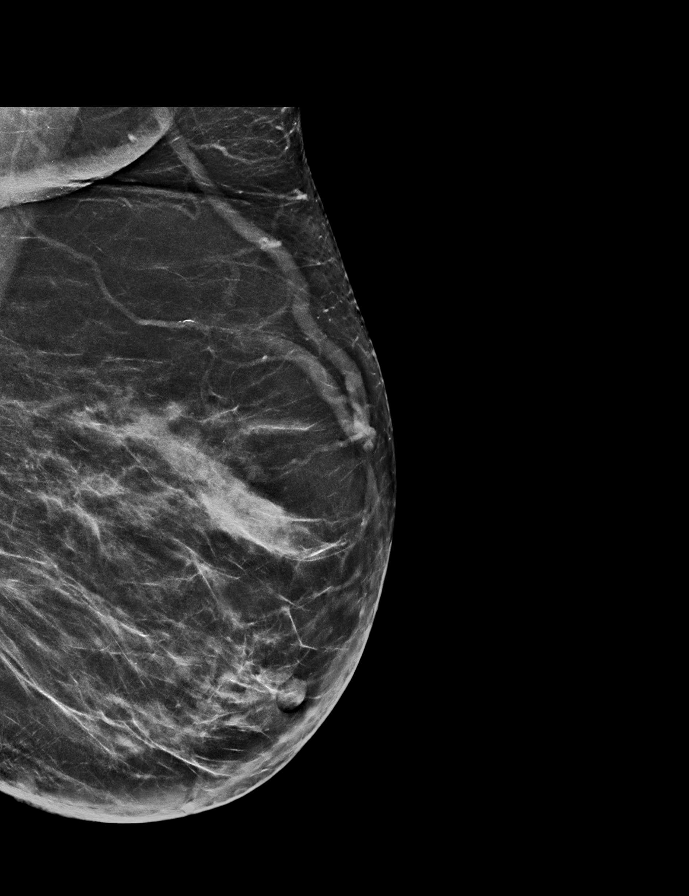

[R MLO synth-2D]
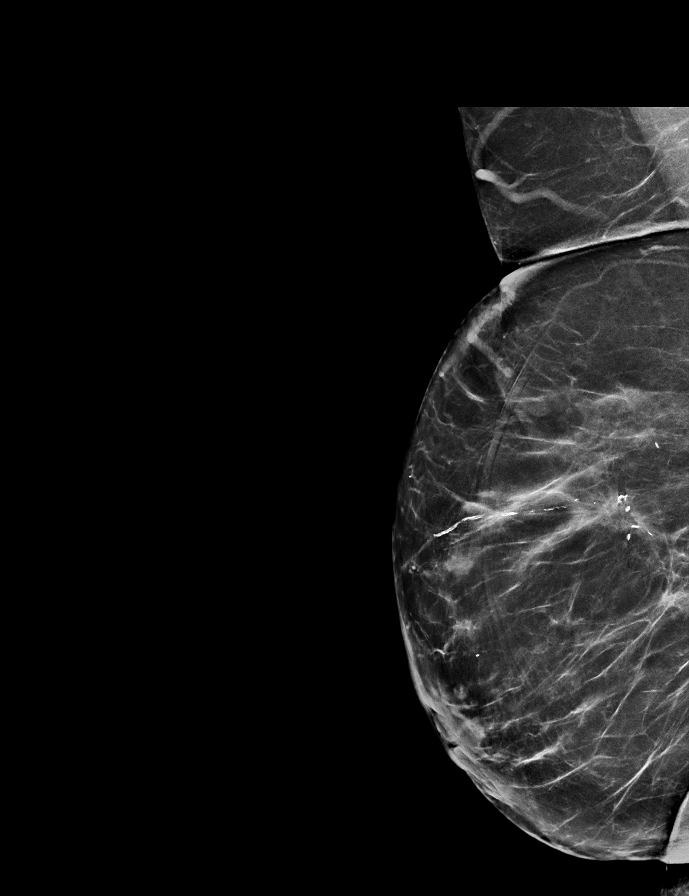

[L MLO (1 of 2)]
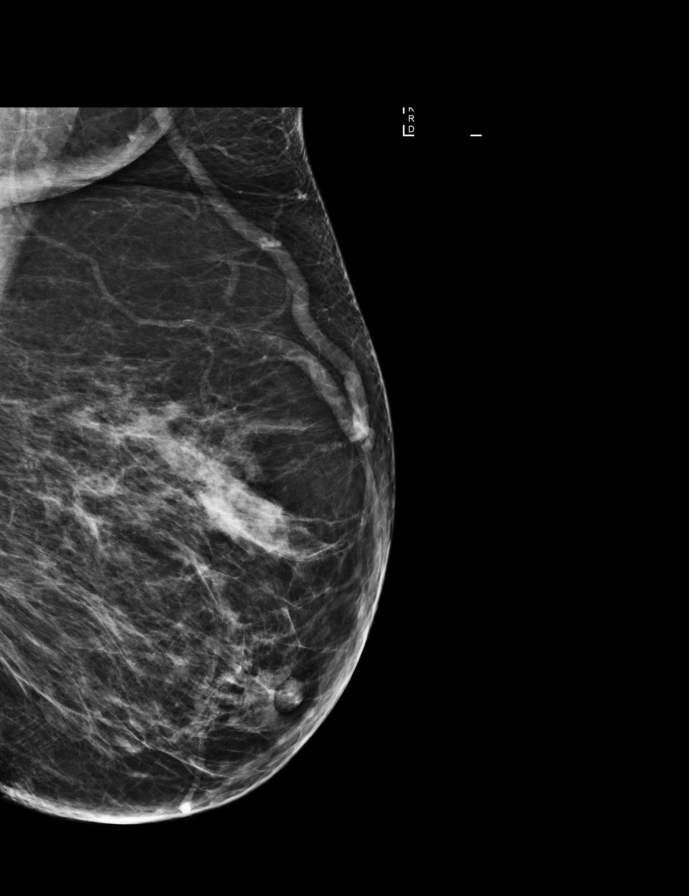

[R MLO]
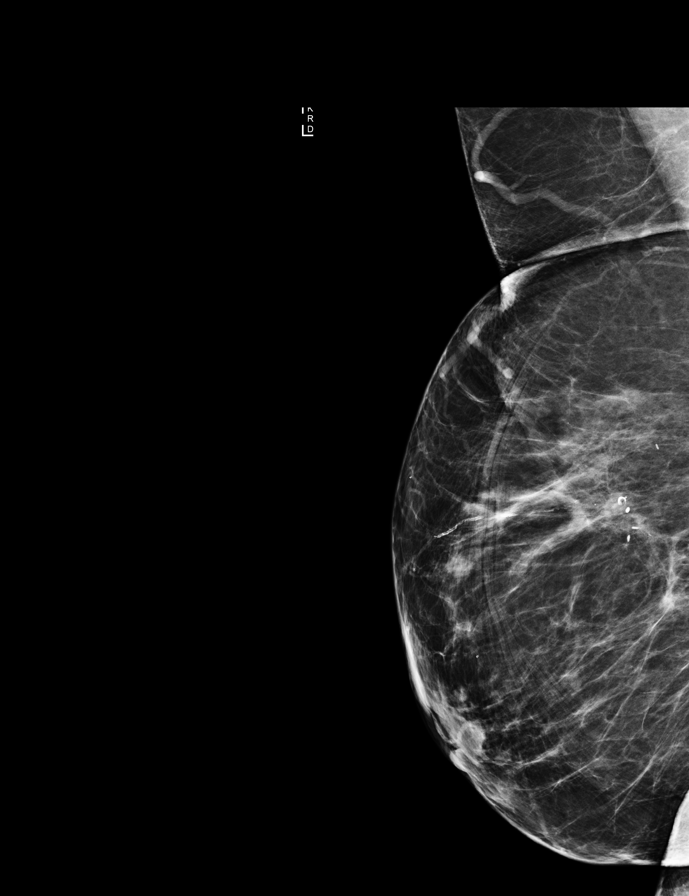

[L CC synth-2D]
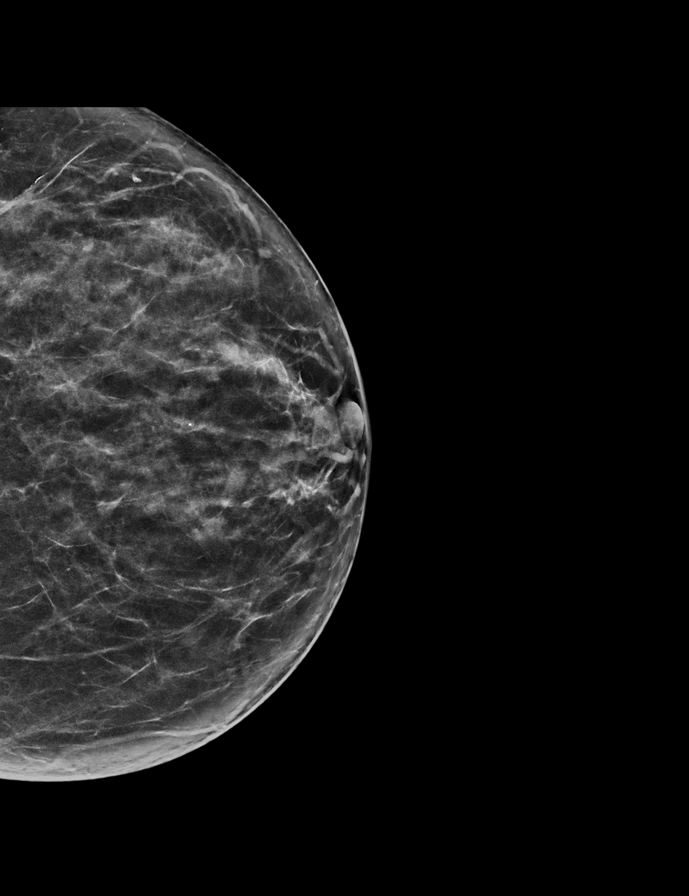

[R CC]
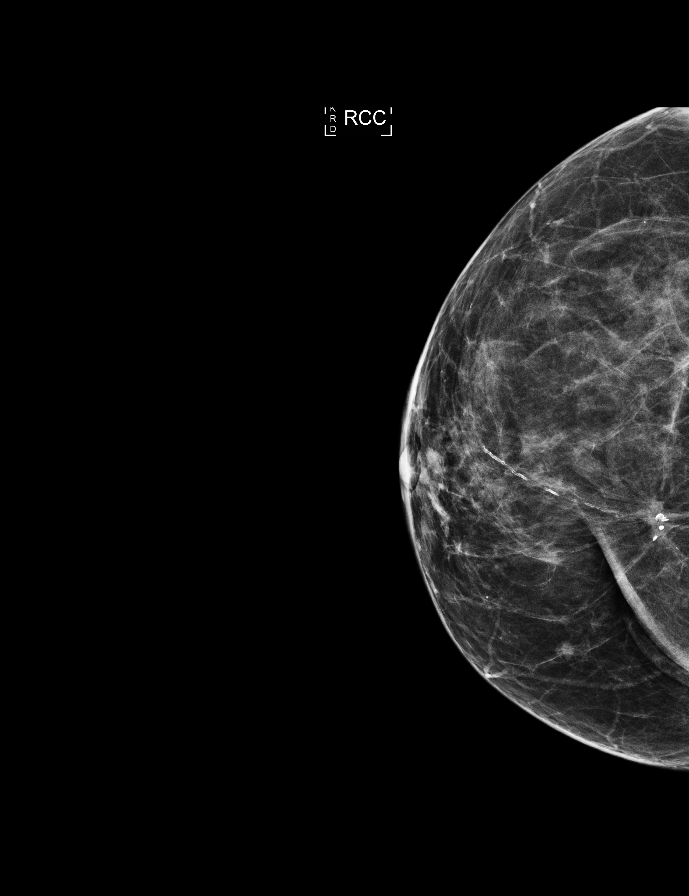

[L MLO synth-2D (2 of 2)]
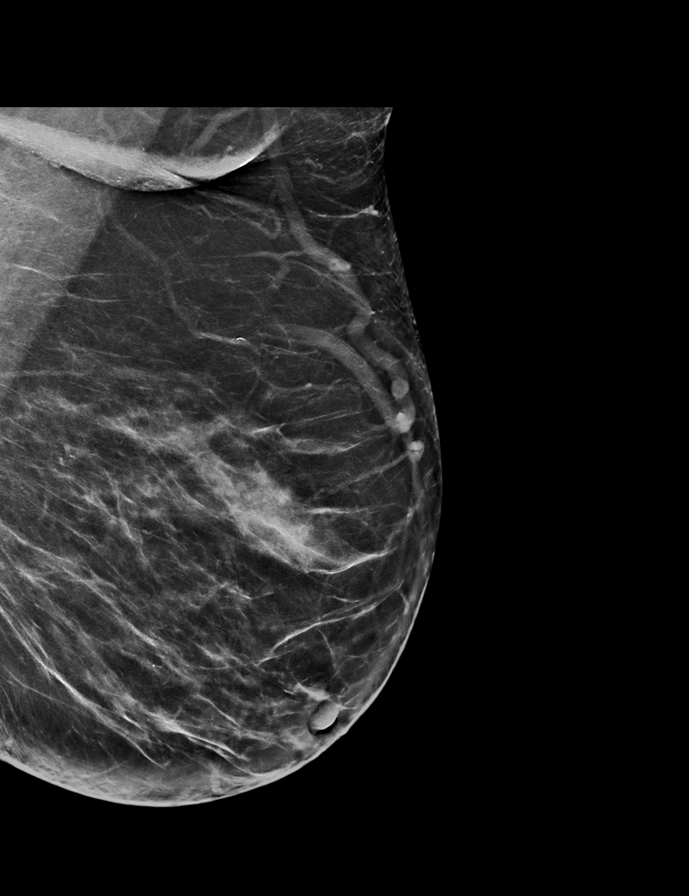

[L MLO (2 of 2)]
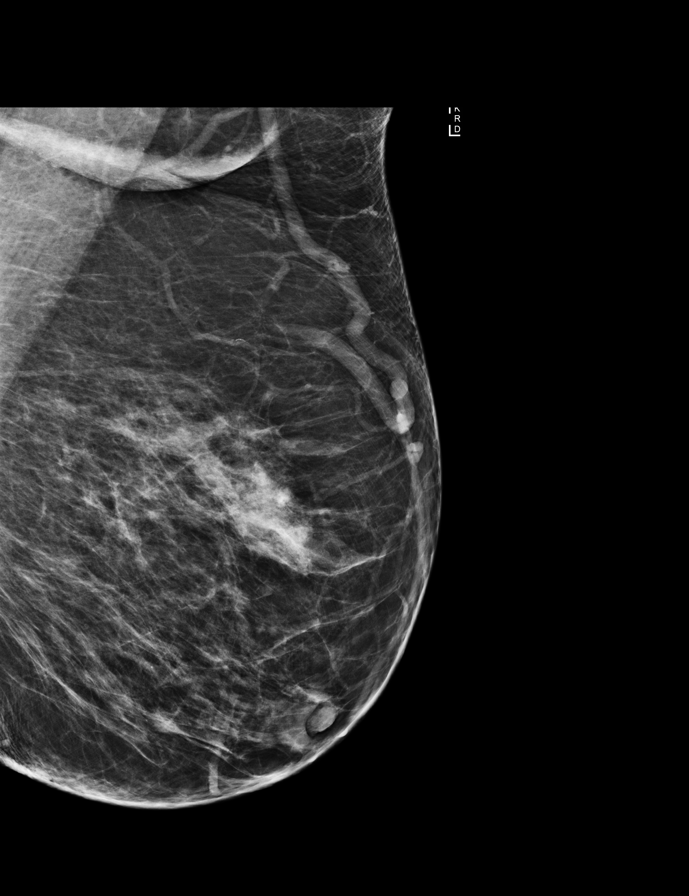

[9 of 40 positions shown; findings below may reference images not displayed]

ACR Breast Density Category b: There are scattered areas of
fibroglandular density.
FINDINGS: In the right breast, a possible mass warrants further evaluation. In
the left breast, no findings suspicious for malignancy. Images were
processed with CAD.
IMPRESSION: Further evaluation is suggested for possible mass in the right
breast.

RECOMMENDATION:
Diagnostic mammogram and possibly ultrasound of the right breast.
(Code:GQ-[id number])

The patient will be contacted regarding the findings, and additional
imaging will be scheduled.

BI-RADS CATEGORY  0: Incomplete. Need additional imaging evaluation
and/or prior mammograms for comparison.

## 2016-07-30 ENCOUNTER — Ambulatory Visit
Admission: RE | Admit: 2016-07-30 | Discharge: 2016-07-30 | Disposition: A | Discharge status: Home / Self Care | Payer: Medicare Other | Source: Ambulatory Visit | Attending: Nurse Practitioner | Admitting: Nurse Practitioner

## 2016-07-30 DIAGNOSIS — Z1231 Encounter for screening mammogram for malignant neoplasm of breast: Secondary | ICD-10-CM | POA: Insufficient documentation

## 2016-07-30 DIAGNOSIS — Z853 Personal history of malignant neoplasm of breast: Secondary | ICD-10-CM

## 2016-07-30 HISTORY — DX: Personal history of irradiation: Z92.3

## 2016-08-09 IMAGING — US US BIOPSY BREAST CORE W/ IMAGING
1 series · 9 of 9 positions shown · non-contrast
Comparison: Previous exam(s).

ADDENDUM:
Patient's pathology has returned. Findings are invasive mammary
carcinoma. Patient's surgeon, Dr. Buttar, gave the patient her
consultation. No reported complaints from the biopsy site.
CLINICAL DATA: 73-year-old female for biopsy of an indeterminate
right breast mass. The previously seen mass was identified on
today's exam at 2 o'clock, 4 cm from the nipple.

EXAM:
ULTRASOUND GUIDED RIGHT BREAST CORE NEEDLE BIOPSY

[Series 1: us biopsy breast core w/ imaging · 0.08mm/px · 9 of 9 slices shown]
[im 1/9]
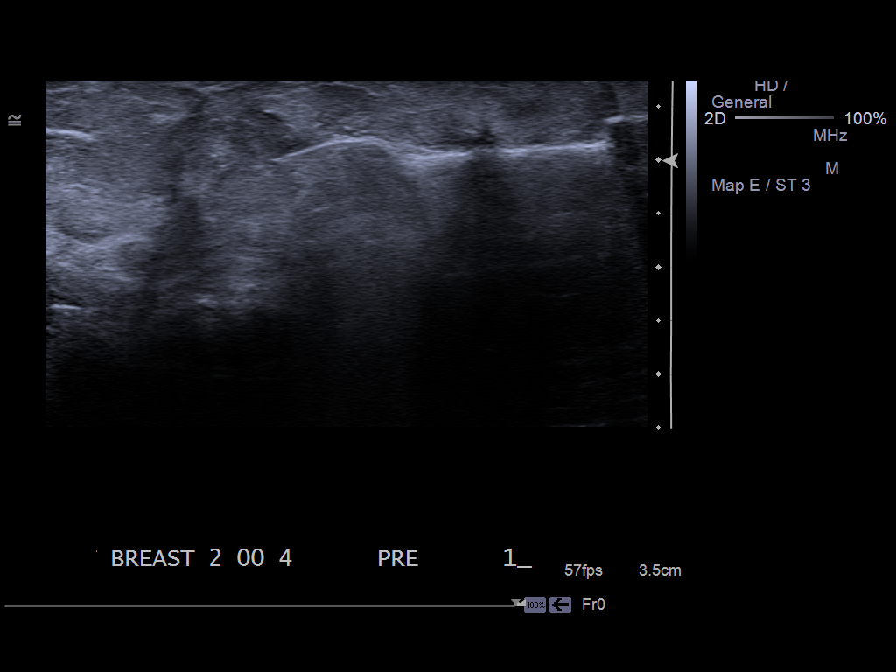
[im 2/9]
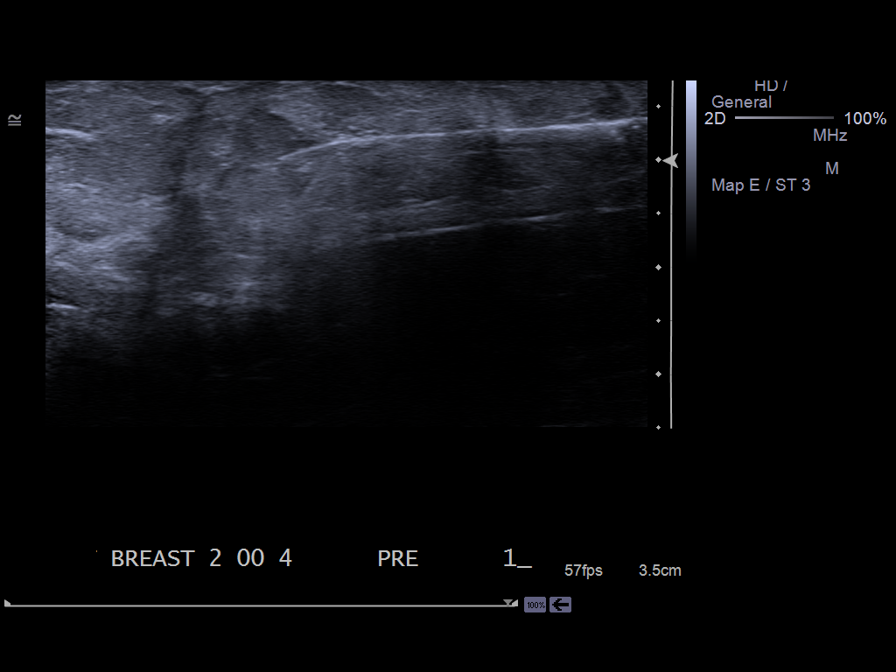
[im 3/9]
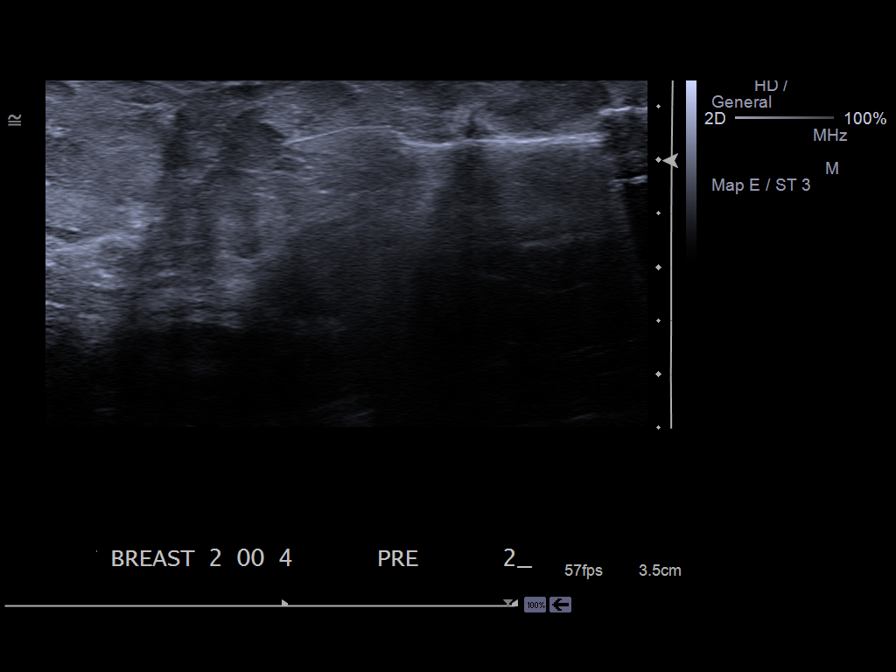
[im 4/9]
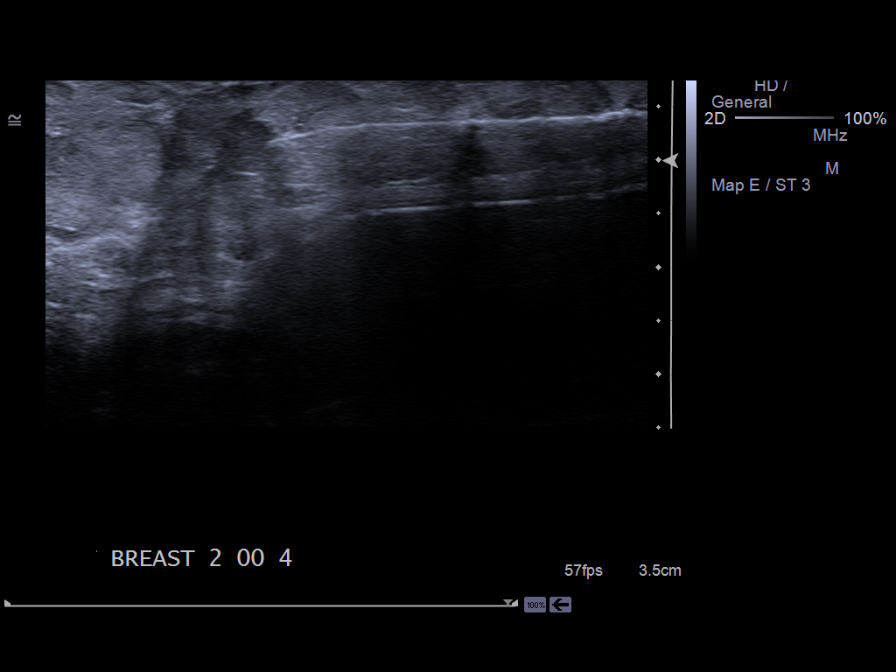
[im 5/9]
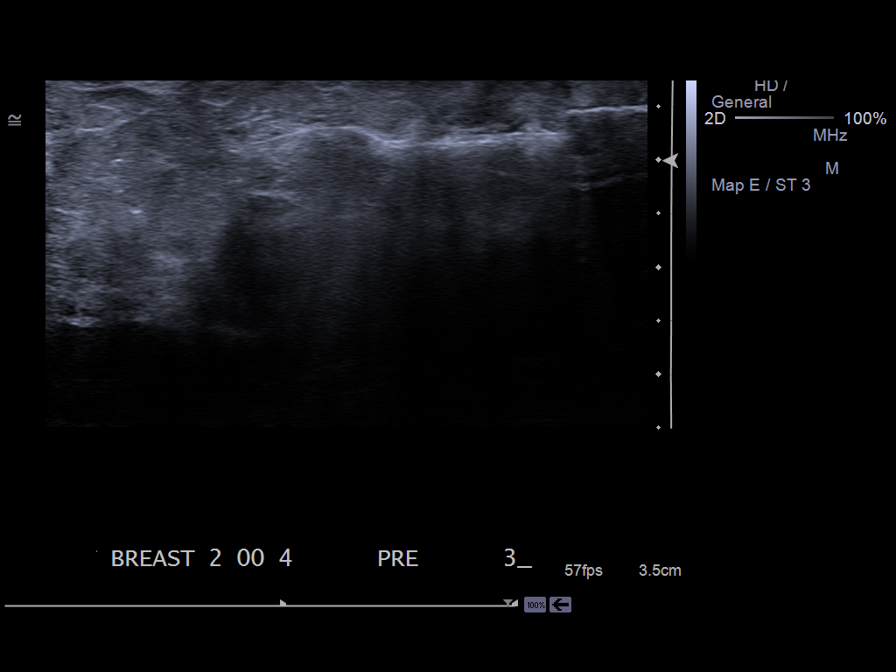
[im 6/9]
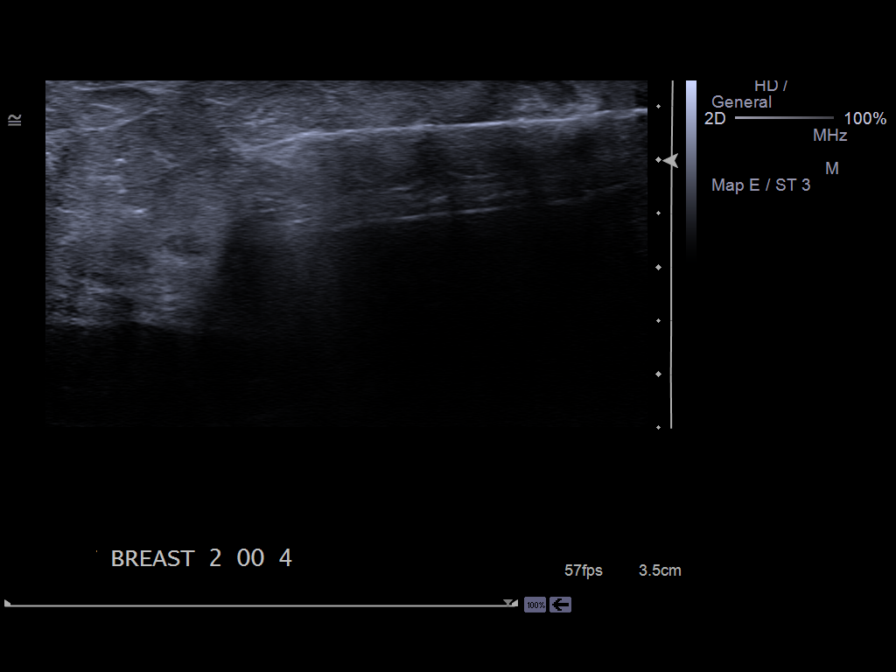
[im 7/9]
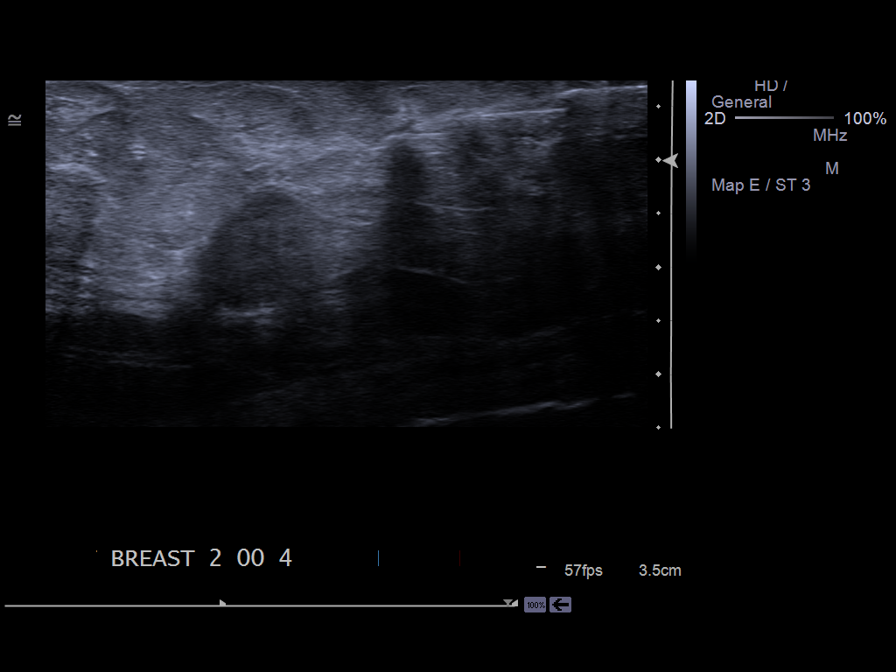
[im 8/9]
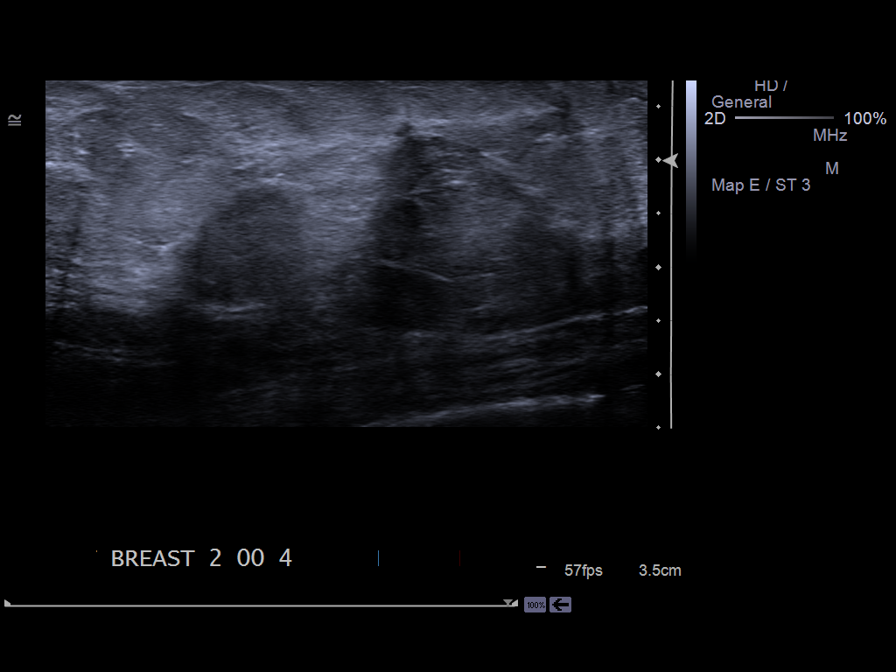
[im 9/9]
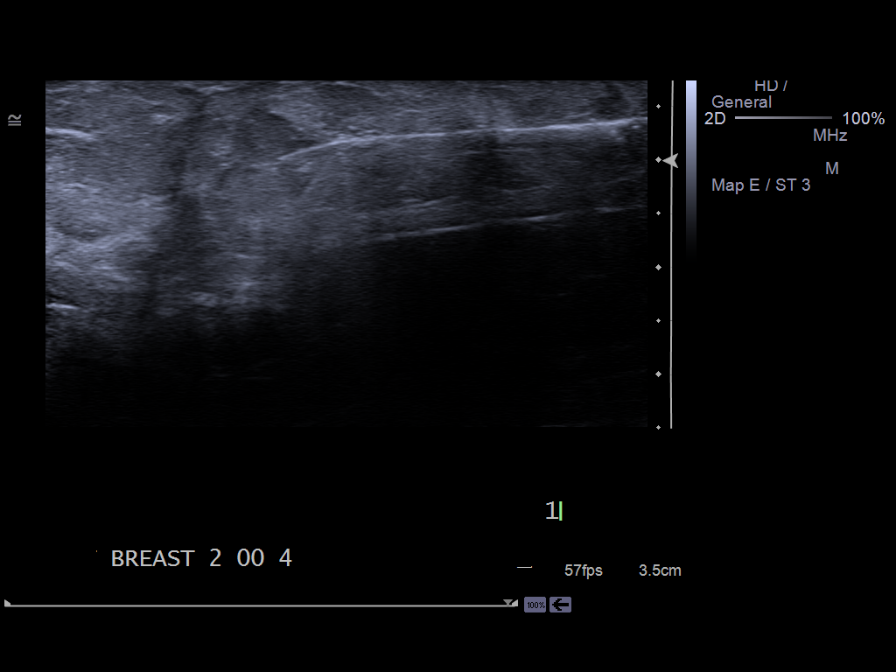

[9 of 9 positions shown; findings below may reference images not displayed]

PROCEDURE:
I met with the patient and we discussed the procedure of
ultrasound-guided biopsy, including benefits and alternatives. We
discussed the high likelihood of a successful procedure. We
discussed the risks of the procedure including infection, bleeding,
tissue injury, clip migration, and inadequate sampling. Informed
written consent was given. The usual time-out protocol was performed
immediately prior to the procedure.

Using sterile technique and 2% Lidocaine as local anesthetic, under
direct ultrasound visualization, a 12 gauge vacuum-assisted device
was used to perform biopsy of an indeterminate right breast mass at
2 o'clock, 4 cm from the nippleusing a medial to lateral approach.
At the conclusion of the procedure, a ribbon shaped tissue marker
clip was deployed into the biopsy cavity. Follow-up 2-view mammogram
was performed and dictated separately.
IMPRESSION: Ultrasound-guided biopsy of an indeterminate right breast mass at 2
o'clock. No apparent complications.

## 2016-08-09 IMAGING — MG MM POST US BIOPSY*R*
1 series · 2 of 2 positions shown · non-contrast
Comparison: Previous exam(s).

CLINICAL DATA: 73-year-old female status post ultrasound-guided
right breast biopsy

EXAM:
DIAGNOSTIC RIGHT MAMMOGRAM POST ULTRASOUND BIOPSY

[R CC · right · 2 of 2 slices shown]
[im 1/2]
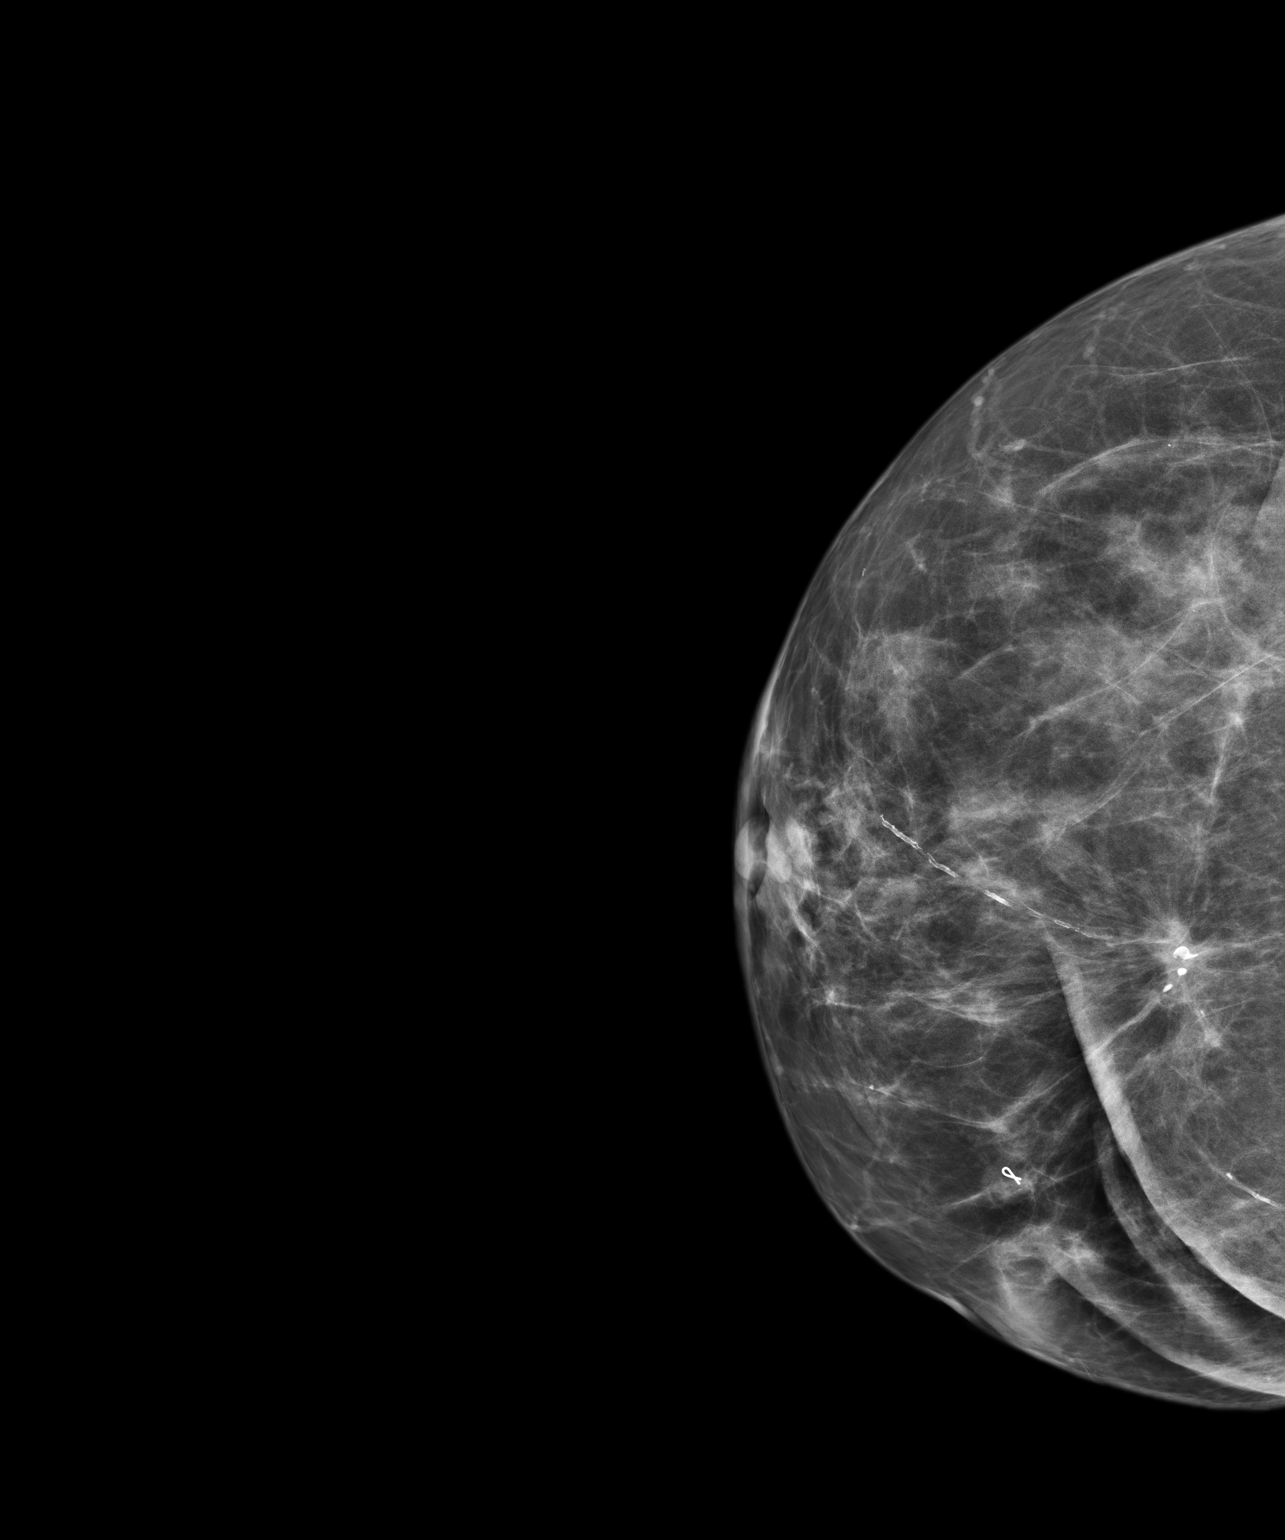
[im 2/2]
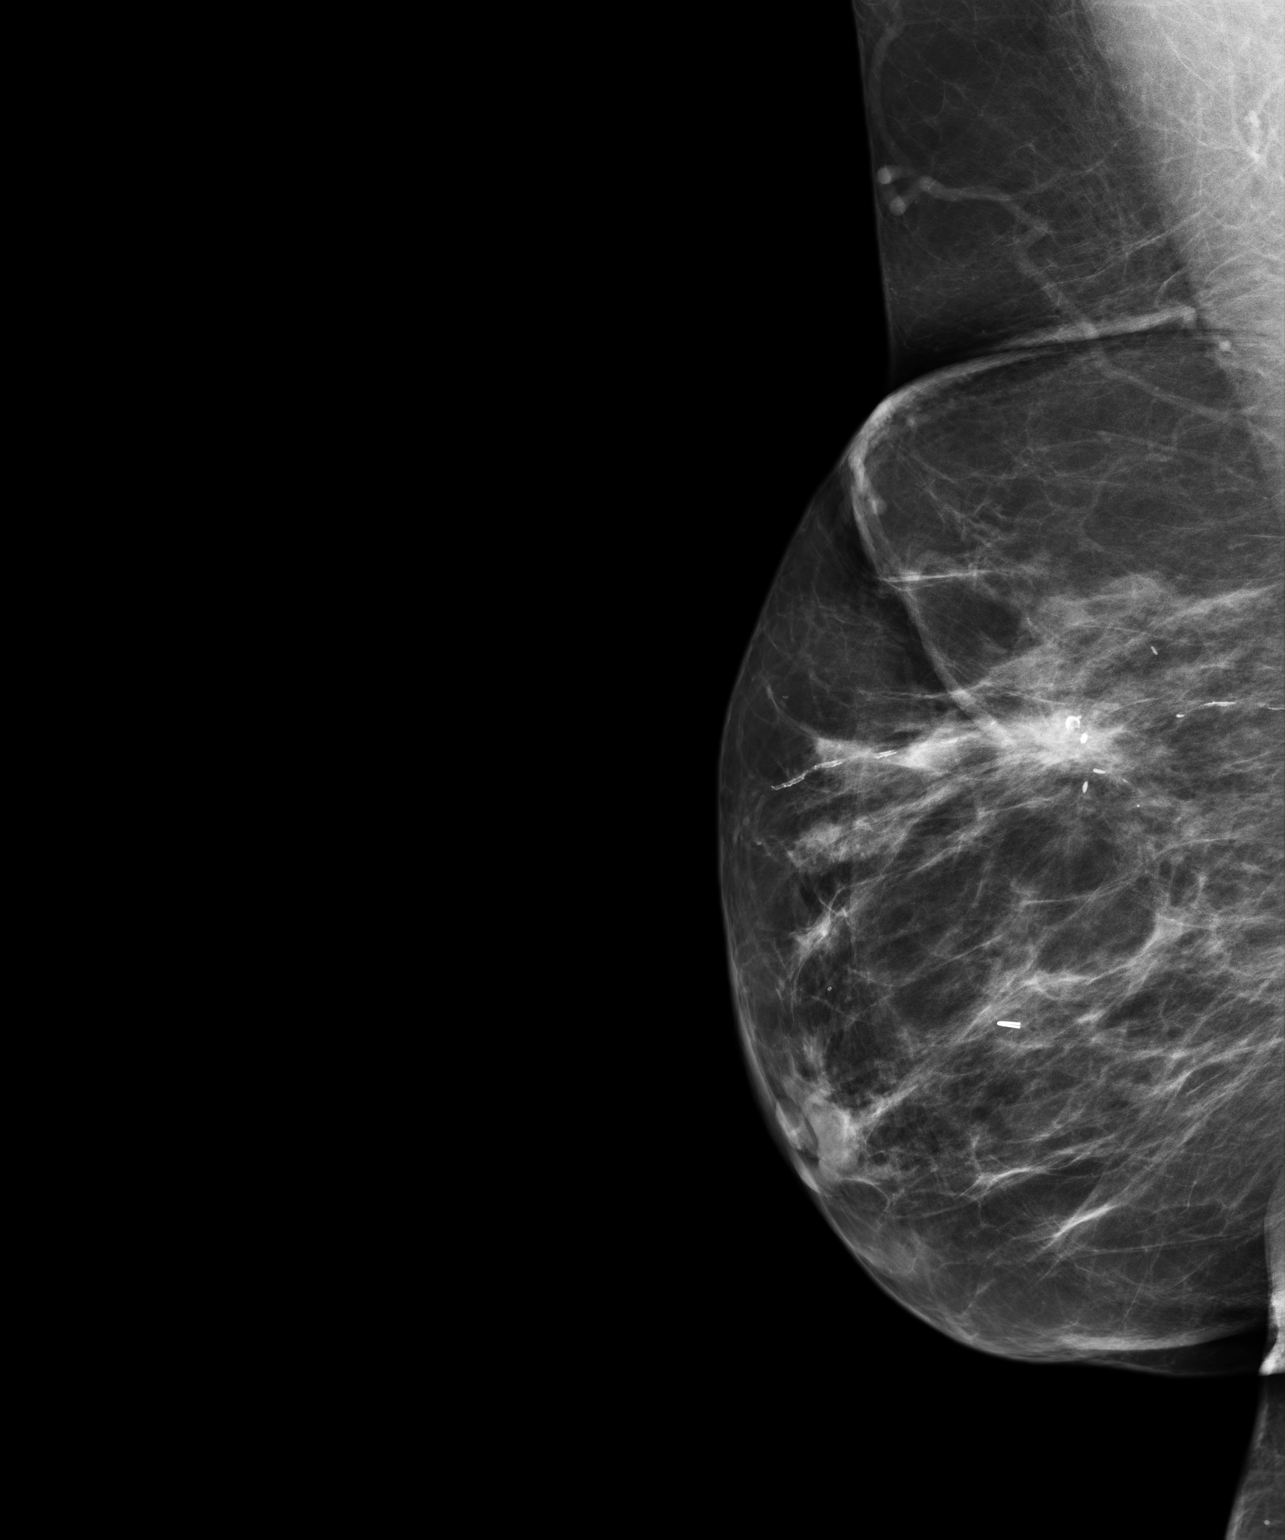

[2 of 2 positions shown; findings below may reference images not displayed]

FINDINGS: Mammographic images were obtained following ultrasound guided biopsy
of an indeterminate right breast mass at 2 o'clock. Post biopsy
mammogram demonstrates the ribbon shaped biopsy marker to be at the
site of the mass within the medial right breast.
IMPRESSION: Satisfactory marker placement post ultrasound-guided right breast
biopsy.

Final Assessment: Post Procedure Mammograms for Marker Placement

## 2016-09-16 ENCOUNTER — Inpatient Hospital Stay: Payer: Medicare Other

## 2016-09-16 ENCOUNTER — Inpatient Hospital Stay: Payer: Medicare Other | Admitting: Hematology and Oncology

## 2016-09-16 ENCOUNTER — Telehealth: Payer: Self-pay | Admitting: *Deleted

## 2016-09-16 NOTE — Progress Notes (Unsigned)
[facility] day:  09/16/2016  Chief Complaint: Sydney Parks is a 76 y.o. female with stage IA right breast cancer who is seen for reassessment.  HPI:  The patient was diagnosed with DCIS in 2000.  She underwent lumpectomy, radiation and 5 years of tamoxifen.  She was diagnosed with breast cancer in 2016. Right breast core needle biopsy at the 2 o'clock position on 07/26/2016 revealed grade I invasive mammary carcinoma.  Right mastectomy and sentinel lymph node biopsy was performed on 08/21/2014 by Dr. Rochel Brome.  Pathology revealed a 1 mm focus of invasive mammary carcinoma.  One sentinel lymph node was negative. With the prior biopsy of 0.5 cm, total size was 0.6 cm (correlated with ultrasound measurement).  Tumor grade I.  Pathologic stage was T1bN0.  Tumor was ER + (>90%), PR +  (> 90%), and Her2/neu 2+ by IHC.  Her2/neu was negative by FISH.  Bone density on 09/12/2016 revealed osteopenia with a T-score of -1.9 in the left femur and -0.4 in the AP spine L3-L4.  She was last seen in the medical oncology clinic on 09/11/2015 by Georgeanne Nim, NP.  Left mammogram on 07/31/2016 revealed no evidence of malignancy.   Past Medical History:  Diagnosis Date  . Arthritis   . Breast cancer ([place]) 2000 and 2016   right breast ca twice.  Mastectomy in 2016  . Cataract   . Diabetes mellitus without complication ([place])   . GERD (gastroesophageal reflux disease)   . Hx of migraines    from ages [age]  . Hypercholesteremia   . Hypertension   . Kidney stone on right side   . Personal history of radiation therapy 2000   right breast ca  . Thyroid disease   . Varicose vein of leg     Past Surgical History:  Procedure Laterality Date  . BREAST BIOPSY Right 2000   breast ca  . BREAST BIOPSY Right 2016   invasive mammary-mastectomy  . BREAST LUMPECTOMY Right 2000   breast ca with rad tx and tamoxifen  . CATARACT EXTRACTION W/ INTRAOCULAR LENS   IMPLANT, BILATERAL    . MASTECTOMY Right 2016   + complete mastectomy and letrozole tx  . SIMPLE MASTECTOMY WITH AXILLARY SENTINEL NODE BIOPSY Right 08/21/2014   Procedure: SIMPLE MASTECTOMY WITH AXILLARY SENTINEL NODE BIOPSY;  Surgeon: Rochel Brome, MD;  Location: [facility] ORS;  Service: General;  Laterality: Right;  . TONSILLECTOMY    . VARICOSE VEIN SURGERY Left    laser surgery    No family history on file.  Social History:  reports that she quit smoking about 46 years ago. She has a 5.00 pack-year smoking history. She does not have any smokeless tobacco history on file. She reports that she does not drink alcohol or use drugs.  The patient is accompanied by *** alone today.  Allergies: No Known Allergies  Current Medications: Current Outpatient Prescriptions  Medication Sig Dispense Refill  . acetaminophen (TYLENOL) 500 MG tablet Take 500 mg by mouth every 6 (six) hours as needed for mild pain.    Marland Kitchen azithromycin (ZITHROMAX) 250 MG tablet Reported on 09/11/2015    . calcium-vitamin D (OSCAL WITH D) 500-200 MG-UNIT per tablet Take 1 tablet by mouth 2 (two) times daily. 60 tablet 6  . cephALEXin (KEFLEX) 500 MG capsule Take 1 capsule (500 mg total) by mouth 3 (three) times daily. 30 capsule 0  . HYDROcodone-homatropine (HYCODAN) 5-1.5 MG/5ML syrup Take by mouth.  Reported on 09/11/2015    . indomethacin (INDOCIN) 50 MG capsule Take 1 capsule (50 mg total) by mouth 3 (three) times daily with meals. 30 capsule 0  . letrozole (FEMARA) 2.5 MG tablet Take 1 tablet (2.5 mg total) by mouth daily. 30 tablet 6  . levothyroxine (SYNTHROID, LEVOTHROID) 100 MCG tablet Take 100 mcg by mouth daily before breakfast.    . lisinopril-hydrochlorothiazide (PRINZIDE,ZESTORETIC) 10-12.5 MG per tablet Take 10-12.5 tablets by mouth every morning.     . lovastatin (MEVACOR) 10 MG tablet Take 10 mg by mouth at bedtime.     . metFORMIN (GLUCOPHAGE) 500 MG tablet Take 500 mg by mouth every morning.      No current  facility-administered medications for this visit.     Review of Systems:  GENERAL:  Feels good.  Active.  No fevers, sweats or weight loss. PERFORMANCE STATUS (ECOG):  *** HEENT:  No visual changes, runny nose, sore throat, mouth sores or tenderness. Lungs: No shortness of breath or cough.  No hemoptysis. Cardiac:  No chest pain, palpitations, orthopnea, or PND. GI:  No nausea, vomiting, diarrhea, constipation, melena or hematochezia. GU:  No urgency, frequency, dysuria, or hematuria. Musculoskeletal:  No back pain.  No joint pain.  No muscle tenderness. Extremities:  No pain or swelling. Skin:  No rashes or skin changes. Neuro:  No headache, numbness or weakness, balance or coordination issues. Endocrine:  No diabetes, thyroid issues, hot flashes or night sweats. Psych:  No mood changes, depression or anxiety. Pain:  No focal pain. Review of systems:  All other systems reviewed and found to be negative.   Physical Exam: There were no vitals taken for this visit. GENERAL:  Well developed, well nourished, sitting comfortably in the exam room in no acute distress. MENTAL STATUS:  Alert and oriented to person, place and time. HEAD:  *** hair.  Normocephalic, atraumatic, face symmetric, no Cushingoid features. EYES:  *** eyes.  Pupils equal round and reactive to light and accomodation.  No conjunctivitis or scleral icterus. ENT:  Oropharynx clear without lesion.  Tongue normal. Mucous membranes moist.  RESPIRATORY:  Clear to auscultation without rales, wheezes or rhonchi. CARDIOVASCULAR:  Regular rate and rhythm without murmur, rub or gallop. ABDOMEN:  Soft, non-tender, with active bowel sounds, and no hepatosplenomegaly.  No masses. SKIN:  No rashes, ulcers or lesions. EXTREMITIES: No edema, no skin discoloration or tenderness.  No palpable cords. LYMPH NODES: No palpable cervical, supraclavicular, axillary or inguinal adenopathy  NEUROLOGICAL: Unremarkable. PSYCH:  Appropriate.  No  visits with results within 3 Day(s) from this visit.  Latest known visit with results is:  Appointment on 09/11/2015  Component Date Value Ref Range Status  . WBC 09/11/2015 5.9  3.6 - 11.0 K/uL Final  . RBC 09/11/2015 5.04  3.80 - 5.20 MIL/uL Final  . Hemoglobin 09/11/2015 14.2  12.0 - 16.0 g/dL Final  . HCT 09/11/2015 41.5  35.0 - 47.0 % Final  . MCV 09/11/2015 82.3  80.0 - 100.0 fL Final  . MCH 09/11/2015 28.1  26.0 - 34.0 pg Final  . MCHC 09/11/2015 34.1  32.0 - 36.0 g/dL Final  . RDW 09/11/2015 13.8  11.5 - 14.5 % Final  . Platelets 09/11/2015 281  150 - 440 K/uL Final  . Neutrophils Relative % 09/11/2015 54  % Final  . Neutro Abs 09/11/2015 3.2  1.4 - 6.5 K/uL Final  . Lymphocytes Relative 09/11/2015 33  % Final  . Lymphs Abs 09/11/2015 1.9  1.0 -  3.6 K/uL Final  . Monocytes Relative 09/11/2015 8  % Final  . Monocytes Absolute 09/11/2015 0.5  0.2 - 0.9 K/uL Final  . Eosinophils Relative 09/11/2015 4  % Final  . Eosinophils Absolute 09/11/2015 0.2  0 - 0.7 K/uL Final  . Basophils Relative 09/11/2015 1  % Final  . Basophils Absolute 09/11/2015 0.1  0 - 0.1 K/uL Final  . Sodium 09/11/2015 140  135 - 145 mmol/L Final  . Potassium 09/11/2015 4.0  3.5 - 5.1 mmol/L Final  . Chloride 09/11/2015 103  101 - 111 mmol/L Final  . CO2 09/11/2015 31  22 - 32 mmol/L Final  . Glucose, Bld 09/11/2015 112* 65 - 99 mg/dL Final  . BUN 09/11/2015 14  6 - 20 mg/dL Final  . Creatinine, Ser 09/11/2015 0.75  0.44 - 1.00 mg/dL Final  . Calcium 09/11/2015 9.6  8.9 - 10.3 mg/dL Final  . Total Protein 09/11/2015 7.4  6.5 - 8.1 g/dL Final  . Albumin 09/11/2015 4.3  3.5 - 5.0 g/dL Final  . AST 09/11/2015 26  15 - 41 U/L Final  . ALT 09/11/2015 24  14 - 54 U/L Final  . Alkaline Phosphatase 09/11/2015 72  38 - 126 U/L Final  . Total Bilirubin 09/11/2015 0.8  0.3 - 1.2 mg/dL Final  . GFR calc non Af Amer 09/11/2015 >60  >60 mL/min Final  . GFR calc Af Amer 09/11/2015 >60  >60 mL/min Final   Comment:  (NOTE) The eGFR has been calculated using the CKD EPI equation. This calculation has not been validated in all clinical situations. eGFR's persistently <60 mL/min signify possible Chronic Kidney Disease.   . Anion gap 09/11/2015 6  5 - 15 Final    Assessment:  Sydney Parks is a 76 y.o. female ***  Plan: 1. *** 2. *** 3. *** 4. *** 5. ***  Lequita Asal, MD  09/16/2016, 5:32 AM

## 2016-09-16 NOTE — Telephone Encounter (Signed)
  Please tell her that I have reviewed her chart thoroughly.   She had DCIS in 2000. She underwent lumpectomy, radiation and 5 years of tamoxifen. She then has breast cancer in 2016. She underwent mastectomy.   She is on Femara (letrozole) which can cause bone thinning.    She had a bone density study in 09/2014 which showed osteopenia. We typically are treating this (calcium, vitamin D, Prolia).   Femara typically continues for 5 years unless BCI testing shows high risk of recurrence.   We follow tumor markers with each visit.   Would recommend follow-up in clinic to address all of these issues.   Discussed above information with patient in length.  Patient voiced understanding and she said she loved the cancer center but just felt like she would have her PCP monitor her at this time.  Instructed patient to call if she changed her mind and we would make her an appt to see MD> voiced understanding.

## 2017-04-16 ENCOUNTER — Encounter: Payer: Self-pay | Admitting: Emergency Medicine

## 2017-04-16 ENCOUNTER — Ambulatory Visit
Admission: EM | Admit: 2017-04-16 | Discharge: 2017-04-16 | Disposition: A | Discharge status: Home / Self Care | Payer: Medicare Other | Attending: Family Medicine | Admitting: Family Medicine

## 2017-04-16 ENCOUNTER — Other Ambulatory Visit: Payer: Self-pay

## 2017-04-16 DIAGNOSIS — Z87442 Personal history of urinary calculi: Secondary | ICD-10-CM | POA: Insufficient documentation

## 2017-04-16 DIAGNOSIS — E039 Hypothyroidism, unspecified: Secondary | ICD-10-CM | POA: Insufficient documentation

## 2017-04-16 DIAGNOSIS — I10 Essential (primary) hypertension: Secondary | ICD-10-CM | POA: Diagnosis not present

## 2017-04-16 DIAGNOSIS — Z853 Personal history of malignant neoplasm of breast: Secondary | ICD-10-CM | POA: Diagnosis not present

## 2017-04-16 DIAGNOSIS — Z7984 Long term (current) use of oral hypoglycemic drugs: Secondary | ICD-10-CM | POA: Insufficient documentation

## 2017-04-16 DIAGNOSIS — K219 Gastro-esophageal reflux disease without esophagitis: Secondary | ICD-10-CM | POA: Insufficient documentation

## 2017-04-16 DIAGNOSIS — E119 Type 2 diabetes mellitus without complications: Secondary | ICD-10-CM | POA: Insufficient documentation

## 2017-04-16 DIAGNOSIS — R300 Dysuria: Secondary | ICD-10-CM | POA: Diagnosis present

## 2017-04-16 DIAGNOSIS — N3001 Acute cystitis with hematuria: Secondary | ICD-10-CM | POA: Diagnosis not present

## 2017-04-16 DIAGNOSIS — E7800 Pure hypercholesterolemia, unspecified: Secondary | ICD-10-CM | POA: Insufficient documentation

## 2017-04-16 DIAGNOSIS — Z87891 Personal history of nicotine dependence: Secondary | ICD-10-CM | POA: Insufficient documentation

## 2017-04-16 DIAGNOSIS — M1990 Unspecified osteoarthritis, unspecified site: Secondary | ICD-10-CM | POA: Diagnosis not present

## 2017-04-16 LAB — URINALYSIS, COMPLETE (UACMP) WITH MICROSCOPIC
Bilirubin Urine: NEGATIVE
Glucose, UA: NEGATIVE mg/dL
Ketones, ur: NEGATIVE mg/dL
Nitrite: NEGATIVE
PH: 6 (ref 5.0–8.0)
Protein, ur: 100 mg/dL — AB
SPECIFIC GRAVITY, URINE: 1.025 (ref 1.005–1.030)

## 2017-04-16 MED ORDER — SULFAMETHOXAZOLE-TRIMETHOPRIM 800-160 MG PO TABS: 1.0000 | ORAL_TABLET | Freq: Two times a day (BID) | ORAL | 0 refills | Status: AC

## 2017-04-16 NOTE — ED Triage Notes (Signed)
Patient in today c/o dysuria and hematuria x 2 days.

## 2017-04-16 NOTE — ED Provider Notes (Signed)
[facility]    CSN: [id number] Arrival date & time: 04/16/17  1518  History   Chief Complaint Chief Complaint  Patient presents with  . Dysuria  . Hematuria   HPI  77 year old female presents with dysuria and hematuria.  Patient reports a 2-day history of gross hematuria and dysuria.  Denies frequency/urgency.  No flank pain.  No back pain.  No fevers or chills.  No nausea vomiting.  Patient denies abdominal pain.  No medications or interventions tried.  No known exacerbating relieving factors.  She is currently pain free.  She states that she has severe pain when she urinates. No other complaints at this time.  Past Medical History:  Diagnosis Date  . Arthritis   . Breast cancer ([place]) 2000 and 2016   right breast ca twice.  Mastectomy in 2016  . Cataract   . Diabetes mellitus without complication ([place])   . GERD (gastroesophageal reflux disease)   . Hx of migraines    from ages [age]  . Hypercholesteremia   . Hypertension   . Kidney stone on right side   . Personal history of radiation therapy 2000   right breast ca  . Thyroid disease   . Varicose vein of leg     Patient Active Problem List   Diagnosis Date Noted  . Malignant neoplasm of breast ([place]) 03/06/2015  . Type 2 diabetes mellitus ([place]) 03/06/2015  . Hypercholesterolemia 03/06/2015  . S/P mastectomy 08/21/2014  . Breast cancer ([place]) 08/21/2014  . Arthritis 08/08/2014  . Diabetes mellitus, type 2 ([place]) 08/08/2014  . Hypercholesteremia 08/08/2014  . BP (high blood pressure) 08/08/2014  . Adult hypothyroidism 08/08/2014    Past Surgical History:  Procedure Laterality Date  . BREAST BIOPSY Right 2000   breast ca  . BREAST BIOPSY Right 2016   invasive mammary-mastectomy  . BREAST LUMPECTOMY Right 2000   breast ca with rad tx and tamoxifen  . CATARACT EXTRACTION W/ INTRAOCULAR LENS  IMPLANT, BILATERAL    . MASTECTOMY Right 2016   + complete mastectomy and letrozole tx  . SIMPLE MASTECTOMY  WITH AXILLARY SENTINEL NODE BIOPSY Right 08/21/2014   Procedure: SIMPLE MASTECTOMY WITH AXILLARY SENTINEL NODE BIOPSY;  Surgeon: Rochel Brome, MD;  Location: [facility] ORS;  Service: General;  Laterality: Right;  . TONSILLECTOMY    . VARICOSE VEIN SURGERY Left    laser surgery    OB History    No data available     Home Medications    Prior to Admission medications   Medication Sig Start Date End Date Taking? Authorizing Provider  acetaminophen (TYLENOL) 500 MG tablet Take 500 mg by mouth every 6 (six) hours as needed for mild pain.   Yes [provider]  levothyroxine (SYNTHROID, LEVOTHROID) 100 MCG tablet Take 100 mcg by mouth daily before breakfast.   Yes [provider]  lisinopril-hydrochlorothiazide (PRINZIDE,ZESTORETIC) 10-12.5 MG per tablet Take 10-12.5 tablets by mouth every morning.  07/03/14  Yes [provider]  metFORMIN (GLUCOPHAGE) 500 MG tablet Take 500 mg by mouth every morning.  07/03/14  Yes [provider]  sulfamethoxazole-trimethoprim (BACTRIM DS,SEPTRA DS) 800-160 MG tablet Take 1 tablet by mouth 2 (two) times daily for 7 days. 04/16/17 04/23/17  Coral Spikes, DO    Family History Family History  Problem Relation Age of Onset  . Osteoarthritis Mother   . Heart disease Father   . Dementia Sister     Social History Social History   Tobacco Use  .  Smoking status: Former Smoker    Packs/day: 0.50    Years: 10.00    Pack years: 5.00    Last attempt to quit: 08/05/1970    Years since quitting: 46.7  . Smokeless tobacco: Never Used  Substance Use Topics  . Alcohol use: No  . Drug use: No     Allergies   Patient has no known allergies.   Review of Systems Review of Systems  Constitutional: Negative.   Gastrointestinal: Negative.   Genitourinary: Positive for dysuria and hematuria. Negative for flank pain.   Physical Exam Triage Vital Signs ED Triage Vitals  Enc Vitals Group     BP 04/16/17 1528 (!) 156/68     Pulse  Rate 04/16/17 1528 64     Resp 04/16/17 1528 16     Temp 04/16/17 1528 97.7 F (36.5 C)     Temp Source 04/16/17 1528 Oral     SpO2 04/16/17 1528 100 %     Weight 04/16/17 1528 170 lb (77.1 kg)     Height 04/16/17 1528 5\' 7"  (1.702 m)     Head Circumference --      Peak Flow --      Pain Score 04/16/17 1529 0     Pain Loc --      Pain Edu? --      Excl. in [place]? --    Updated Vital Signs BP (!) 156/68 (BP Location: Left Arm)   Pulse 64   Temp 97.7 F (36.5 C) (Oral)   Resp 16   Ht 5\' 7"  (1.702 m)   Wt 170 lb (77.1 kg)   SpO2 100%   BMI 26.63 kg/m     Physical Exam  Constitutional: She is oriented to person, place, and time. She appears well-developed. No distress.  HENT:  Head: Normocephalic and atraumatic.  Cardiovascular: Normal rate and regular rhythm.  No murmur heard. Pulmonary/Chest: Effort normal and breath sounds normal. She has no wheezes. She has no rales.  Abdominal: Soft.  Mild suprapubic tenderness.  Neurological: She is alert and oriented to person, place, and time.  Psychiatric: She has a normal mood and affect.  Nursing note and vitals reviewed.  UC Treatments / Results  Labs (all labs ordered are listed, but only abnormal results are displayed) Labs Reviewed  URINALYSIS, COMPLETE (UACMP) WITH MICROSCOPIC - Abnormal; Notable for the following components:      Result Value   APPearance CLOUDY (*)    Hgb urine dipstick MODERATE (*)    Protein, ur 100 (*)    Leukocytes, UA MODERATE (*)    Squamous Epithelial / LPF 0-5 (*)    Bacteria, UA MANY (*)    All other components within normal limits  URINE CULTURE    EKG  EKG Interpretation None       Radiology No results found.  Procedures Procedures (including critical care time)  Medications Ordered in UC Medications - No data to display   Initial Impression / Assessment and Plan / UC Course  I have reviewed the triage vital signs and the nursing notes.  Pertinent labs & imaging results  that were available during my care of the patient were reviewed by me and considered in my medical decision making (see chart for details).     77 year old female presents with UTI.  Treating with Bactrim.  Sending culture.  Final Clinical Impressions(s) / UC Diagnoses   Final diagnoses:  Acute cystitis with hematuria    ED Discharge Orders  Ordered    sulfamethoxazole-trimethoprim (BACTRIM DS,SEPTRA DS) 800-160 MG tablet  2 times daily     04/16/17 1614     Controlled Substance Prescriptions [place] Controlled Substance Registry consulted? Not Applicable   Coral Spikes, DO 04/16/17 1646

## 2017-04-19 LAB — URINE CULTURE

## 2017-07-05 DIAGNOSIS — M109 Gout, unspecified: Secondary | ICD-10-CM | POA: Insufficient documentation

## 2017-07-12 ENCOUNTER — Other Ambulatory Visit: Payer: Self-pay | Admitting: Nurse Practitioner

## 2017-07-12 DIAGNOSIS — Z1231 Encounter for screening mammogram for malignant neoplasm of breast: Secondary | ICD-10-CM

## 2017-08-02 ENCOUNTER — Ambulatory Visit
Admission: RE | Admit: 2017-08-02 | Discharge: 2017-08-02 | Disposition: A | Discharge status: Home / Self Care | Payer: Medicare Other | Source: Ambulatory Visit | Attending: Nurse Practitioner | Admitting: Nurse Practitioner

## 2017-08-02 DIAGNOSIS — Z1231 Encounter for screening mammogram for malignant neoplasm of breast: Secondary | ICD-10-CM | POA: Diagnosis not present

## 2018-07-19 ENCOUNTER — Other Ambulatory Visit: Payer: Self-pay | Admitting: Nurse Practitioner

## 2018-07-19 DIAGNOSIS — Z1231 Encounter for screening mammogram for malignant neoplasm of breast: Secondary | ICD-10-CM

## 2018-07-19 DIAGNOSIS — Z853 Personal history of malignant neoplasm of breast: Secondary | ICD-10-CM | POA: Insufficient documentation

## 2018-09-08 ENCOUNTER — Other Ambulatory Visit: Payer: Self-pay

## 2018-09-08 ENCOUNTER — Ambulatory Visit
Admission: RE | Admit: 2018-09-08 | Discharge: 2018-09-08 | Disposition: A | Discharge status: Home / Self Care | Payer: Medicare Other | Source: Ambulatory Visit | Attending: Nurse Practitioner | Admitting: Nurse Practitioner

## 2018-09-08 DIAGNOSIS — Z1231 Encounter for screening mammogram for malignant neoplasm of breast: Secondary | ICD-10-CM | POA: Insufficient documentation

## 2018-09-16 DIAGNOSIS — I493 Ventricular premature depolarization: Secondary | ICD-10-CM | POA: Insufficient documentation

## 2018-10-05 DIAGNOSIS — I351 Nonrheumatic aortic (valve) insufficiency: Secondary | ICD-10-CM | POA: Insufficient documentation

## 2018-11-14 ENCOUNTER — Ambulatory Visit
Admission: EM | Admit: 2018-11-14 | Discharge: 2018-11-14 | Disposition: A | Discharge status: Home / Self Care | Payer: Medicare Other | Attending: Family Medicine | Admitting: Family Medicine

## 2018-11-14 ENCOUNTER — Other Ambulatory Visit: Payer: Self-pay

## 2018-11-14 ENCOUNTER — Ambulatory Visit: Payer: Medicare Other

## 2018-11-14 DIAGNOSIS — W19XXXA Unspecified fall, initial encounter: Secondary | ICD-10-CM | POA: Diagnosis not present

## 2018-11-14 DIAGNOSIS — M19041 Primary osteoarthritis, right hand: Secondary | ICD-10-CM | POA: Insufficient documentation

## 2018-11-14 DIAGNOSIS — Z923 Personal history of irradiation: Secondary | ICD-10-CM | POA: Insufficient documentation

## 2018-11-14 DIAGNOSIS — Z79890 Hormone replacement therapy (postmenopausal): Secondary | ICD-10-CM | POA: Diagnosis not present

## 2018-11-14 DIAGNOSIS — Z8261 Family history of arthritis: Secondary | ICD-10-CM | POA: Insufficient documentation

## 2018-11-14 DIAGNOSIS — E7800 Pure hypercholesterolemia, unspecified: Secondary | ICD-10-CM | POA: Insufficient documentation

## 2018-11-14 DIAGNOSIS — I10 Essential (primary) hypertension: Secondary | ICD-10-CM | POA: Insufficient documentation

## 2018-11-14 DIAGNOSIS — M25559 Pain in unspecified hip: Secondary | ICD-10-CM

## 2018-11-14 DIAGNOSIS — Z87891 Personal history of nicotine dependence: Secondary | ICD-10-CM | POA: Insufficient documentation

## 2018-11-14 DIAGNOSIS — S93401A Sprain of unspecified ligament of right ankle, initial encounter: Secondary | ICD-10-CM

## 2018-11-14 DIAGNOSIS — E119 Type 2 diabetes mellitus without complications: Secondary | ICD-10-CM | POA: Insufficient documentation

## 2018-11-14 DIAGNOSIS — E039 Hypothyroidism, unspecified: Secondary | ICD-10-CM | POA: Diagnosis not present

## 2018-11-14 DIAGNOSIS — M25551 Pain in right hip: Secondary | ICD-10-CM

## 2018-11-14 DIAGNOSIS — Z853 Personal history of malignant neoplasm of breast: Secondary | ICD-10-CM | POA: Diagnosis not present

## 2018-11-14 DIAGNOSIS — Z7984 Long term (current) use of oral hypoglycemic drugs: Secondary | ICD-10-CM | POA: Insufficient documentation

## 2018-11-14 DIAGNOSIS — S7001XA Contusion of right hip, initial encounter: Secondary | ICD-10-CM

## 2018-11-14 DIAGNOSIS — Y92008 Other place in unspecified non-institutional (private) residence as the place of occurrence of the external cause: Secondary | ICD-10-CM | POA: Diagnosis not present

## 2018-11-14 DIAGNOSIS — Z79899 Other long term (current) drug therapy: Secondary | ICD-10-CM | POA: Diagnosis not present

## 2018-11-14 DIAGNOSIS — Z8249 Family history of ischemic heart disease and other diseases of the circulatory system: Secondary | ICD-10-CM | POA: Diagnosis not present

## 2018-11-14 DIAGNOSIS — S60221A Contusion of right hand, initial encounter: Secondary | ICD-10-CM | POA: Diagnosis not present

## 2018-11-14 MED ORDER — AMOXICILLIN 875 MG PO TABS: 875.0000 mg | ORAL_TABLET | Freq: Two times a day (BID) | ORAL | 0 refills | Status: DC

## 2018-11-14 MED ORDER — TRAMADOL HCL 50 MG PO TABS: ORAL_TABLET | ORAL | 0 refills | Status: DC

## 2018-11-14 MED ORDER — ACETAMINOPHEN 500 MG PO TABS
1000.0000 mg | ORAL_TABLET | Freq: Once | ORAL | Status: AC
  Administered 2018-11-14: 13:00:00 1000 mg via ORAL

## 2018-11-14 NOTE — ED Triage Notes (Signed)
Patient states that she was sitting on her front porch and when she got up her right leg was asleep. States that she fell on her right hip and tried to catch herself with her hand. Patient with significant swelling to right base of thumb, Right hip pain, right ankle pain.

## 2018-11-14 NOTE — ED Provider Notes (Signed)
[facility]    CSN: [id number] Arrival date & time: 11/14/18  1202     History   Chief Complaint Chief Complaint  Patient presents with  . Fall    HPI AVIRA TILLISON is a 78 y.o. female.   78 yo female with a c/o pain to right side of her body after fall this morning at home on her front porch. States she got up from chair and right leg "was asleep" causing her to fall against the front door. States she hit her right side (hand, wrist, hip, and ankle). Denies hitting her head or loss of consciousness.    Fall    Past Medical History:  Diagnosis Date  . Arthritis   . Breast cancer ([place]) 2000 and 2016   right breast ca twice.  Mastectomy in 2016  . Cataract   . Diabetes mellitus without complication ([place])   . GERD (gastroesophageal reflux disease)   . Hx of migraines    from ages [age]  . Hypercholesteremia   . Hypertension   . Kidney stone on right side   . Personal history of radiation therapy 2000   right breast ca  . Thyroid disease   . Varicose vein of leg     Patient Active Problem List   Diagnosis Date Noted  . Malignant neoplasm of breast ([place]) 03/06/2015  . Type 2 diabetes mellitus ([place]) 03/06/2015  . Hypercholesterolemia 03/06/2015  . S/P mastectomy 08/21/2014  . Breast cancer ([place]) 08/21/2014  . Arthritis 08/08/2014  . Diabetes mellitus, type 2 ([place]) 08/08/2014  . Hypercholesteremia 08/08/2014  . BP (high blood pressure) 08/08/2014  . Adult hypothyroidism 08/08/2014    Past Surgical History:  Procedure Laterality Date  . BREAST BIOPSY Right 2000   breast ca  . BREAST BIOPSY Right 2016   invasive mammary-mastectomy  . BREAST LUMPECTOMY Right 2000   breast ca with rad tx and tamoxifen  . CATARACT EXTRACTION W/ INTRAOCULAR LENS  IMPLANT, BILATERAL    . MASTECTOMY Right 2016   + complete mastectomy and letrozole tx  . SIMPLE MASTECTOMY WITH AXILLARY SENTINEL NODE BIOPSY Right 08/21/2014   Procedure: SIMPLE MASTECTOMY WITH AXILLARY  SENTINEL NODE BIOPSY;  Surgeon: Rochel Brome, MD;  Location: [facility] ORS;  Service: General;  Laterality: Right;  . TONSILLECTOMY    . VARICOSE VEIN SURGERY Left    laser surgery    OB History   No obstetric history on file.      Home Medications    Prior to Admission medications   Medication Sig Start Date End Date Taking? Authorizing Provider  acetaminophen (TYLENOL) 500 MG tablet Take 500 mg by mouth every 6 (six) hours as needed for mild pain.   Yes [provider]  levothyroxine (SYNTHROID, LEVOTHROID) 100 MCG tablet Take 100 mcg by mouth daily before breakfast.   Yes [provider]  lisinopril-hydrochlorothiazide (PRINZIDE,ZESTORETIC) 10-12.5 MG per tablet Take 10-12.5 tablets by mouth every morning.  07/03/14  Yes [provider]  metFORMIN (GLUCOPHAGE) 500 MG tablet Take 500 mg by mouth every morning.  07/03/14  Yes [provider]  amoxicillin (AMOXIL) 875 MG tablet Take 1 tablet (875 mg total) by mouth 2 (two) times daily. 11/14/18   Norval Gable, MD  metoprolol succinate (TOPROL-XL) 25 MG 24 hr tablet Take 25 mg by mouth daily. 10/26/18   [provider]  traMADol Veatrice Bourbon) 50 MG tablet 1-2 tablets po q 8 hours prn 11/14/18   Norval Gable, MD  Family History Family History  Problem Relation Age of Onset  . Osteoarthritis Mother   . Heart disease Father   . Dementia Sister   . Breast cancer Daughter 65    Social History Social History   Tobacco Use  . Smoking status: Former Smoker    Packs/day: 0.50    Years: 10.00    Pack years: 5.00    Quit date: 08/05/1970    Years since quitting: 48.3  . Smokeless tobacco: Never Used  Substance Use Topics  . Alcohol use: No  . Drug use: No     Allergies   Patient has no known allergies.   Review of Systems Review of Systems   Physical Exam Triage Vital Signs ED Triage Vitals  Enc Vitals Group     BP 11/14/18 1300 (!) 198/64     Pulse Rate 11/14/18 1300 (!) 50      Resp 11/14/18 1300 18     Temp 11/14/18 1300 98.7 F (37.1 C)     Temp Source 11/14/18 1300 Oral     SpO2 11/14/18 1300 100 %     Weight 11/14/18 1258 165 lb (74.8 kg)     Height 11/14/18 1258 5\' 6"  (1.676 m)     Head Circumference --      Peak Flow --      Pain Score 11/14/18 1258 7     Pain Loc --      Pain Edu? --      Excl. in [place]? --    No data found.  Updated Vital Signs BP (!) 198/64 (BP Location: Left Arm)   Pulse (!) 50   Temp 98.7 F (37.1 C) (Oral)   Resp 18   Ht 5\' 6"  (1.676 m)   Wt 74.8 kg   SpO2 100%   BMI 26.63 kg/m   Visual Acuity Right Eye Distance:   Left Eye Distance:   Bilateral Distance:    Right Eye Near:   Left Eye Near:    Bilateral Near:     Physical Exam Vitals signs and nursing note reviewed.  Constitutional:      General: She is not in acute distress.    Appearance: She is not toxic-appearing or diaphoretic.  Pulmonary:     Effort: Pulmonary effort is normal. No respiratory distress.  Musculoskeletal:     Right wrist: She exhibits tenderness, bony tenderness and swelling. She exhibits normal range of motion, no effusion, no crepitus, no deformity and no laceration.     Right hip: She exhibits tenderness and bony tenderness. She exhibits normal range of motion, normal strength, no swelling, no crepitus, no deformity and no laceration.     Right hand: She exhibits tenderness, bony tenderness, laceration (3 mm superficial skin tear) and swelling. She exhibits normal range of motion, normal two-point discrimination, normal capillary refill and no deformity. Normal sensation noted. Normal strength noted.  Neurological:     General: No focal deficit present.     Mental Status: She is alert.     Cranial Nerves: No cranial nerve deficit.      UC Treatments / Results  Labs (all labs ordered are listed, but only abnormal results are displayed) Labs Reviewed - No data to display  EKG   Radiology Dg Wrist Complete Right  Result Date:  11/14/2018 CLINICAL DATA:  Fall with pain and swelling EXAM: RIGHT WRIST - COMPLETE 3+ VIEW COMPARISON:  Right hand 11/14/2018 FINDINGS: Negative for fracture. Normal alignment. Advanced degenerative change base of  thumb. Diffuse cartilage loss and degenerative change in the wrist joint. No erosion. IMPRESSION: Negative for fracture. Electronically Signed   By: Franchot Gallo M.D.   On: 11/14/2018 14:05   Dg Ankle Complete Right  Result Date: 11/14/2018 CLINICAL DATA:  Swelling and pain after a fall.  Lateral symptoms. EXAM: RIGHT ANKLE - COMPLETE 3+ VIEW COMPARISON:  None. FINDINGS: Mild lateral soft tissue swelling but no evidence of fracture or dislocation. IMPRESSION: Mild lateral soft tissue swelling. Electronically Signed   By: Nelson Chimes M.D.   On: 11/14/2018 14:02   Dg Hand Complete Right  Result Date: 11/14/2018 CLINICAL DATA:  Swelling and pain after a fall. EXAM: RIGHT HAND - COMPLETE 3+ VIEW COMPARISON:  None. FINDINGS: No acute fracture or dislocation is identified. Advanced first [place] osteoarthrosis is noted with severe joint space narrowing and extensive osteophyte formation. Intercarpal and radiocarpal joint space narrowing are also noted. There is also moderate to severe osteoarthrosis involving the PIP and DIP joints of multiple fingers. The soft tissues are unremarkable. IMPRESSION: 1. No acute osseous abnormality identified. 2. Advanced osteoarthrosis. Electronically Signed   By: Logan Bores M.D.   On: 11/14/2018 14:03   Dg Hip Unilat With Pelvis 2-3 Views Right  Result Date: 11/14/2018 CLINICAL DATA:  Fall, right hip pain EXAM: DG HIP (WITH OR WITHOUT PELVIS) 2-3V RIGHT COMPARISON:  None. FINDINGS: There is no evidence of hip fracture or dislocation. There is no evidence of arthropathy or other focal bone abnormality. Degenerative changes in the pubic symphysis. IMPRESSION: Negative. Electronically Signed   By: Franchot Gallo M.D.   On: 11/14/2018 14:03    Procedures Procedures  (including critical care time)  Medications Ordered in UC Medications  acetaminophen (TYLENOL) tablet 1,000 mg (1,000 mg Oral Given 11/14/18 1322)    Initial Impression / Assessment and Plan / UC Course  I have reviewed the triage vital signs and the nursing notes.  Pertinent labs & imaging results that were available during my care of the patient were reviewed by me and considered in my medical decision making (see chart for details).      Final Clinical Impressions(s) / UC Diagnoses   Final diagnoses:  Hip pain  Contusion of right hand, initial encounter  Contusion of right hip, initial encounter  Sprain of right ankle, unspecified ligament, initial encounter  Fall, initial encounter     Discharge Instructions     Rest, ice, elevation, tylenol    ED Prescriptions    Medication Sig Dispense Auth. Provider   amoxicillin (AMOXIL) 875 MG tablet Take 1 tablet (875 mg total) by mouth 2 (two) times daily. 14 tablet Drue Camera, Linward Foster, MD   traMADol (ULTRAM) 50 MG tablet 1-2 tablets po q 8 hours prn 15 tablet Makyna Niehoff, MD      1. x-ray results and diagnosis reviewed with patient 2. rx as per orders above; reviewed possible side effects, interactions, risks and benefits  3. Recommend supportive treatment as above  4. Follow-up prn if symptoms worsen or don't improve   Controlled Substance Prescriptions [place] Controlled Substance Registry consulted? Not Applicable   Norval Gable, MD 11/14/18 [phone]

## 2018-11-14 NOTE — Discharge Instructions (Signed)
Rest, ice, elevation, tylenol °

## 2019-07-25 ENCOUNTER — Other Ambulatory Visit: Payer: Self-pay | Admitting: Nurse Practitioner

## 2019-07-25 DIAGNOSIS — Z1231 Encounter for screening mammogram for malignant neoplasm of breast: Secondary | ICD-10-CM

## 2019-09-11 ENCOUNTER — Ambulatory Visit
Admission: RE | Admit: 2019-09-11 | Discharge: 2019-09-11 | Disposition: A | Discharge status: Home / Self Care | Payer: Medicare Other | Source: Ambulatory Visit | Attending: Nurse Practitioner | Admitting: Nurse Practitioner

## 2019-09-11 DIAGNOSIS — Z1231 Encounter for screening mammogram for malignant neoplasm of breast: Secondary | ICD-10-CM

## 2020-05-07 ENCOUNTER — Other Ambulatory Visit: Payer: Self-pay | Admitting: Cardiology

## 2020-05-07 DIAGNOSIS — R600 Localized edema: Secondary | ICD-10-CM

## 2020-05-07 DIAGNOSIS — R1011 Right upper quadrant pain: Secondary | ICD-10-CM

## 2020-05-16 ENCOUNTER — Ambulatory Visit
Admission: RE | Admit: 2020-05-16 | Discharge: 2020-05-16 | Disposition: A | Discharge status: Home / Self Care | Payer: Medicare Other | Source: Ambulatory Visit | Attending: Cardiology | Admitting: Cardiology

## 2020-05-16 ENCOUNTER — Other Ambulatory Visit: Payer: Self-pay

## 2020-05-16 DIAGNOSIS — R600 Localized edema: Secondary | ICD-10-CM | POA: Diagnosis not present

## 2020-05-16 DIAGNOSIS — R1011 Right upper quadrant pain: Secondary | ICD-10-CM | POA: Insufficient documentation

## 2020-07-29 ENCOUNTER — Other Ambulatory Visit: Payer: Self-pay | Admitting: Nurse Practitioner

## 2020-07-29 DIAGNOSIS — Z1231 Encounter for screening mammogram for malignant neoplasm of breast: Secondary | ICD-10-CM

## 2020-09-11 ENCOUNTER — Ambulatory Visit
Admission: RE | Admit: 2020-09-11 | Discharge: 2020-09-11 | Disposition: A | Discharge status: Home / Self Care | Payer: Medicare Other | Source: Ambulatory Visit | Attending: Nurse Practitioner | Admitting: Nurse Practitioner

## 2020-09-11 ENCOUNTER — Other Ambulatory Visit: Payer: Self-pay

## 2020-09-11 DIAGNOSIS — Z1231 Encounter for screening mammogram for malignant neoplasm of breast: Secondary | ICD-10-CM | POA: Insufficient documentation

## 2021-07-14 ENCOUNTER — Encounter (INDEPENDENT_AMBULATORY_CARE_PROVIDER_SITE_OTHER): Payer: Self-pay

## 2021-07-14 ENCOUNTER — Encounter (INDEPENDENT_AMBULATORY_CARE_PROVIDER_SITE_OTHER): Payer: Self-pay | Admitting: Vascular Surgery

## 2021-08-04 ENCOUNTER — Encounter (INDEPENDENT_AMBULATORY_CARE_PROVIDER_SITE_OTHER): Payer: Self-pay | Admitting: Vascular Surgery

## 2021-08-04 ENCOUNTER — Encounter (INDEPENDENT_AMBULATORY_CARE_PROVIDER_SITE_OTHER): Payer: Self-pay

## 2021-08-21 ENCOUNTER — Encounter (INDEPENDENT_AMBULATORY_CARE_PROVIDER_SITE_OTHER): Payer: Self-pay

## 2021-08-21 ENCOUNTER — Encounter (INDEPENDENT_AMBULATORY_CARE_PROVIDER_SITE_OTHER): Payer: Self-pay | Admitting: Vascular Surgery

## 2021-09-09 ENCOUNTER — Other Ambulatory Visit: Payer: Self-pay | Admitting: Nurse Practitioner

## 2021-09-09 DIAGNOSIS — Z1231 Encounter for screening mammogram for malignant neoplasm of breast: Secondary | ICD-10-CM

## 2021-09-12 ENCOUNTER — Other Ambulatory Visit (INDEPENDENT_AMBULATORY_CARE_PROVIDER_SITE_OTHER): Payer: Self-pay | Admitting: Vascular Surgery

## 2021-09-12 DIAGNOSIS — I83812 Varicose veins of left lower extremities with pain: Secondary | ICD-10-CM

## 2021-09-15 ENCOUNTER — Encounter (INDEPENDENT_AMBULATORY_CARE_PROVIDER_SITE_OTHER): Payer: Self-pay | Admitting: Vascular Surgery

## 2021-09-15 ENCOUNTER — Ambulatory Visit (INDEPENDENT_AMBULATORY_CARE_PROVIDER_SITE_OTHER): Payer: Medicare Other

## 2021-09-15 ENCOUNTER — Ambulatory Visit (INDEPENDENT_AMBULATORY_CARE_PROVIDER_SITE_OTHER): Payer: Medicare Other | Admitting: Vascular Surgery

## 2021-09-15 VITALS — BP 196/68 | HR 66 | Resp 16 | Wt 158.4 lb

## 2021-09-15 DIAGNOSIS — I890 Lymphedema, not elsewhere classified: Secondary | ICD-10-CM | POA: Diagnosis not present

## 2021-09-15 DIAGNOSIS — I83812 Varicose veins of left lower extremities with pain: Secondary | ICD-10-CM

## 2021-09-15 DIAGNOSIS — E7800 Pure hypercholesterolemia, unspecified: Secondary | ICD-10-CM

## 2021-09-15 DIAGNOSIS — E119 Type 2 diabetes mellitus without complications: Secondary | ICD-10-CM

## 2021-09-15 DIAGNOSIS — I10 Essential (primary) hypertension: Secondary | ICD-10-CM

## 2021-09-15 DIAGNOSIS — I872 Venous insufficiency (chronic) (peripheral): Secondary | ICD-10-CM

## 2021-09-17 ENCOUNTER — Encounter (INDEPENDENT_AMBULATORY_CARE_PROVIDER_SITE_OTHER): Payer: Self-pay | Admitting: Vascular Surgery

## 2021-09-17 DIAGNOSIS — I890 Lymphedema, not elsewhere classified: Secondary | ICD-10-CM | POA: Insufficient documentation

## 2021-09-17 DIAGNOSIS — I872 Venous insufficiency (chronic) (peripheral): Secondary | ICD-10-CM | POA: Insufficient documentation

## 2021-09-17 NOTE — Progress Notes (Signed)
MRN : [id number]  Sydney Parks is a 81 y.o. ([birth year]) female who presents with chief complaint of legs swell.  History of Present Illness:   Patient is seen for evaluation of leg swelling. The patient first noticed the swelling remotely but is now concerned because of a significant increase in the overall edema. The swelling isn't associated with significant pain.  There has been an increasing amount of  discoloration noted by the patient. The patient notes that in the morning the legs are improved but they steadily worsened throughout the course of the day. Elevation seems to make the swelling of the legs better, dependency makes them much worse.   There is no history of ulcerations associated with the swelling.   The patient denies any recent changes in their medications.  The patient has not been wearing graduated compression.  The patient has no had any past angiography, interventions or vascular surgery.  The patient denies a history of DVT or PE. There is no prior history of phlebitis. There is no history of primary lymphedema.  There is no history of radiation treatment to the groin or pelvis No history of malignancies. No history of trauma or groin or pelvic surgery. No history of foreign travel or parasitic infections area    Venous duplex done today and reviewed by me shows normal deep venous system with superficial reflux of the left leg  Current Meds  Medication Sig   acetaminophen (TYLENOL) 500 MG tablet Take 500 mg by mouth every 6 (six) hours as needed for mild pain.   allopurinol (ZYLOPRIM) 300 MG tablet Take 300 mg by mouth daily.   colchicine 0.6 MG tablet Take 0.6 mg by mouth as needed.   levothyroxine (SYNTHROID, LEVOTHROID) 100 MCG tablet Take 100 mcg by mouth daily before breakfast.   lisinopril-hydrochlorothiazide (PRINZIDE,ZESTORETIC) 10-12.5 MG per tablet Take 10-12.5 tablets by mouth every morning.    metFORMIN (GLUCOPHAGE) 500 MG tablet Take 500  mg by mouth every morning.     Past Medical History:  Diagnosis Date   Arthritis    Breast cancer ([place]) 2000 and 2016   right breast ca twice.  Mastectomy in 2016   Cataract    Diabetes mellitus without complication (HCC)    GERD (gastroesophageal reflux disease)    Hx of migraines    from ages [age]   Hypercholesteremia    Hypertension    Kidney stone on right side    Personal history of radiation therapy 2000   right breast ca   Thyroid disease    Varicose vein of leg     Past Surgical History:  Procedure Laterality Date   BREAST BIOPSY Right 2000   breast ca   BREAST BIOPSY Right 2016   invasive mammary-mastectomy   BREAST LUMPECTOMY Right 2000   breast ca with rad tx and tamoxifen   CATARACT EXTRACTION W/ INTRAOCULAR LENS  IMPLANT, BILATERAL     MASTECTOMY Right 2016   + complete mastectomy and letrozole tx   SIMPLE MASTECTOMY WITH AXILLARY SENTINEL NODE BIOPSY Right 08/21/2014   Procedure: SIMPLE MASTECTOMY WITH AXILLARY SENTINEL NODE BIOPSY;  Surgeon: Rochel Brome, MD;  Location: [facility] ORS;  Service: General;  Laterality: Right;   TONSILLECTOMY     VARICOSE VEIN SURGERY Left    laser surgery    Social History Social History   Tobacco Use   Smoking status: Former    Packs/day: 0.50    Years: 10.00    Total pack  years: 5.00    Types: Cigarettes    Quit date: 08/05/1970    Years since quitting: 51.1   Smokeless tobacco: Never  Vaping Use   Vaping Use: Never used  Substance Use Topics   Alcohol use: No   Drug use: No    Family History Family History  Problem Relation Age of Onset   Osteoarthritis Mother    Heart disease Father    Dementia Sister    Breast cancer Daughter 47    Allergies  Allergen Reactions   Alendronate Diarrhea    Other reaction(s): Dizziness, Other (See Comments) Joint pain   Nadolol     Other reaction(s): Dizziness Per patient, "Felt like the back of head was going to explode"   Statins     Other reaction(s): Muscle  Pain Mevacor-myalgias.  Crestor-"Just didn't feel right."     REVIEW OF SYSTEMS (Negative unless checked)  Constitutional: '[]'$ Weight loss  '[]'$ Fever  '[]'$ Chills Cardiac: '[]'$ Chest pain   '[]'$ Chest pressure   '[]'$ Palpitations   '[]'$ Shortness of breath when laying flat   '[]'$ Shortness of breath with exertion. Vascular:  '[]'$ Pain in legs with walking   '[x]'$ Pain in legs at rest  '[]'$ History of DVT   '[]'$ Phlebitis   '[x]'$ Swelling in legs   '[]'$ Varicose veins   '[]'$ Non-healing ulcers Pulmonary:   '[]'$ Uses home oxygen   '[]'$ Productive cough   '[]'$ Hemoptysis   '[]'$ Wheeze  '[]'$ COPD   '[]'$ Asthma Neurologic:  '[]'$ Dizziness   '[]'$ Seizures   '[]'$ History of stroke   '[]'$ History of TIA  '[]'$ Aphasia   '[]'$ Vissual changes   '[]'$ Weakness or numbness in arm   '[]'$ Weakness or numbness in leg Musculoskeletal:   '[]'$ Joint swelling   '[]'$ Joint pain   '[]'$ Low back pain Hematologic:  '[]'$ Easy bruising  '[]'$ Easy bleeding   '[]'$ Hypercoagulable state   '[]'$ Anemic Gastrointestinal:  '[]'$ Diarrhea   '[]'$ Vomiting  '[]'$ Gastroesophageal reflux/heartburn   '[]'$ Difficulty swallowing. Genitourinary:  '[]'$ Chronic kidney disease   '[]'$ Difficult urination  '[]'$ Frequent urination   '[]'$ Blood in urine Skin:  '[]'$ Rashes   '[]'$ Ulcers  Psychological:  '[]'$ History of anxiety   '[]'$  History of major depression.  Physical Examination  Vitals:   09/15/21 1453  BP: (!) 196/68  Pulse: 66  Resp: 16  Weight: 158 lb 6.4 oz (71.8 kg)   Body mass index is 25.57 kg/m. Gen: WD/WN, NAD Head: [place]/AT, No temporalis wasting.  Ear/Nose/Throat: Hearing grossly intact, nares w/o erythema or drainage, pinna without lesions Eyes: PER, EOMI, sclera nonicteric.  Neck: Supple, no gross masses.  No JVD.  Pulmonary:  Good air movement, no audible wheezing, no use of accessory muscles.  Cardiac: RRR, precordium not hyperdynamic. Vascular:  scattered varicosities present bilaterally.  Moderate venous stasis changes to the legs bilaterally.  2+ soft pitting edema  Vessel Right Left  Radial Palpable Palpable  Gastrointestinal: soft,  non-distended. No guarding/no peritoneal signs.  Musculoskeletal: M/S 5/5 throughout.  No deformity.  Neurologic: CN 2-12 intact. Pain and light touch intact in extremities.  Symmetrical.  Speech is fluent. Motor exam as listed above. Psychiatric: Judgment intact, Mood & affect appropriate for pt's clinical situation. Dermatologic: Venous rashes no ulcers noted.  No changes consistent with cellulitis. Lymph : No lichenification or skin changes of chronic lymphedema.  CBC Lab Results  Component Value Date   WBC 5.9 09/11/2015   HGB 14.2 09/11/2015   HCT 41.5 09/11/2015   MCV 82.3 09/11/2015   PLT 281 09/11/2015    BMET    Component Value Date/Time   NA 140 09/11/2015 0948   K 4.0  09/11/2015 0948   CL 103 09/11/2015 0948   CO2 31 09/11/2015 0948   GLUCOSE 112 (H) 09/11/2015 0948   BUN 14 09/11/2015 0948   CREATININE 0.75 09/11/2015 0948   CALCIUM 9.6 09/11/2015 0948   GFRNONAA >60 09/11/2015 0948   GFRAA >60 09/11/2015 0948   CrCl cannot be calculated (Patient's most recent lab result is older than the maximum 21 days allowed.).  COAG No results found for: "INR", "PROTIME"  Radiology VAS [place] LOWER EXTREMITY VENOUS REFLUX  Result Date: 09/15/2021  Lower Venous Reflux Study Patient Name:  JOHNNAE IMPASTATO Saks  Date of Exam:   09/15/2021 Medical Rec #: [id number]       Accession #:    [id number] Date of Birth: 06/12/1940        Patient Gender: F Patient Age:   51 years Exam Location:  [facility] Procedure:      VAS [place] LOWER EXTREMITY VENOUS REFLUX Referring Phys: Hortencia Pilar --------------------------------------------------------------------------------  Indications: Pain, and varicosities.  Performing Technologist: Almira Coaster RVS  Examination Guidelines: A complete evaluation includes B-mode imaging, spectral Doppler, color Doppler, and power Doppler as needed of all accessible portions of each vessel. Bilateral testing is considered an integral part of a complete  examination. Limited examinations for reoccurring indications may be performed as noted. The reflux portion of the exam is performed with the patient in reverse Trendelenburg. Significant venous reflux is defined as >500 ms in the superficial venous system, and >1 second in the deep venous system.  +--------------+---------+------+-----------+------------+--------+ LEFT          Reflux NoRefluxReflux TimeDiameter cmsComments                         Yes                                  +--------------+---------+------+-----------+------------+--------+ CFV           no                                             +--------------+---------+------+-----------+------------+--------+ FV prox       no                                             +--------------+---------+------+-----------+------------+--------+ FV mid        no                                             +--------------+---------+------+-----------+------------+--------+ FV dist       no                                             +--------------+---------+------+-----------+------------+--------+ Popliteal     no                                             +--------------+---------+------+-----------+------------+--------+  GSV at [facility]              yes    2230 ms      .80              +--------------+---------+------+-----------+------------+--------+ GSV prox thighno                            .54              +--------------+---------+------+-----------+------------+--------+ GSV mid thigh no                            .39              +--------------+---------+------+-----------+------------+--------+ GSV dist thigh                                      NWV      +--------------+---------+------+-----------+------------+--------+ GSV at knee                                         NWV      +--------------+---------+------+-----------+------------+--------+ GSV prox calf            yes                 .37              +--------------+---------+------+-----------+------------+--------+ SSV Pop Fossa no                                             +--------------+---------+------+-----------+------------+--------+   Summary: Left: - No evidence of deep vein thrombosis seen in the left lower extremity, from the common femoral through the popliteal veins. - No evidence of superficial venous thrombosis in the left lower extremity. - There is no evidence of venous reflux seen in the left lower extremity. - No evidence of superficial venous reflux seen in the left short saphenous vein. - Venous reflux is noted in the left sapheno-femoral junction. - Reflux seen in the Left Varicose veins in the Mid, Mid/Dist,Distal thigh and Proximal Calf area.  *See table(s) above for measurements and observations. Electronically signed by Hortencia Pilar MD on 09/15/2021 at 5:25:21 PM.    Final      Assessment/Plan 1. Chronic venous insufficiency Recommend:  No surgery or intervention at this point in time.  I have reviewed my discussion with the patient regarding venous insufficiency and why it causes symptoms. I have discussed with the patient the chronic skin changes that accompany venous insufficiency and the long term sequela such as ulceration. Patient will contnue wearing graduated compression stockings on a daily basis, as this has provided excellent control of his edema. The patient will put the stockings on first thing in the morning and removing them in the evening. The patient is reminded not to sleep in the stockings.  In addition, behavioral modification including elevation during the day will be initiated. Exercise is strongly encouraged.  Previous duplex ultrasound of the lower extremities shows normal deep system, no significant superficial reflux was identified.  Given the patient's good control and lack of any problems regarding the venous insufficiency and  lymphedema a lymph pump in not need at this time.    The patient will follow up with me PRN should anything change.  The patient voices agreement with this plan.   2. Lymphedema Recommend:  No surgery or intervention at this point in time.  I have reviewed my discussion with the patient regarding venous insufficiency and why it causes symptoms. I have discussed with the patient the chronic skin changes that accompany venous insufficiency and the long term sequela such as ulceration. Patient will contnue wearing graduated compression stockings on a daily basis, as this has provided excellent control of his edema. The patient will put the stockings on first thing in the morning and removing them in the evening. The patient is reminded not to sleep in the stockings.  In addition, behavioral modification including elevation during the day will be initiated. Exercise is strongly encouraged.  Previous duplex ultrasound of the lower extremities shows normal deep system, no significant superficial reflux was identified.  Given the patient's good control and lack of any problems regarding the venous insufficiency and lymphedema a lymph pump in not need at this time.    The patient will follow up with me PRN should anything change.  The patient voices agreement with this plan.   3. Primary hypertension Continue antihypertensive medications as already ordered, these medications have been reviewed and there are no changes at this time.   4. Type 2 diabetes mellitus without complication, without long-term current use of insulin (HCC) Continue hypoglycemic medications as already ordered, these medications have been reviewed and there are no changes at this time.  Hgb A1C to be monitored as already arranged by primary service   5. Hypercholesterolemia Continue statin as ordered and reviewed, no changes at this time     Hortencia Pilar, MD  09/17/2021 1:18 PM

## 2021-10-02 ENCOUNTER — Inpatient Hospital Stay: Admission: RE | Admit: 2021-10-02 | Payer: Medicare Other | Source: Ambulatory Visit

## 2022-01-15 ENCOUNTER — Ambulatory Visit: Payer: Medicare Other | Admitting: Dermatology

## 2022-02-02 ENCOUNTER — Encounter (INDEPENDENT_AMBULATORY_CARE_PROVIDER_SITE_OTHER): Payer: Self-pay

## 2022-05-15 ENCOUNTER — Other Ambulatory Visit: Payer: Self-pay | Admitting: Nurse Practitioner

## 2022-05-15 DIAGNOSIS — Z853 Personal history of malignant neoplasm of breast: Secondary | ICD-10-CM

## 2022-05-15 DIAGNOSIS — R222 Localized swelling, mass and lump, trunk: Secondary | ICD-10-CM

## 2022-05-20 ENCOUNTER — Other Ambulatory Visit: Payer: Self-pay | Admitting: Nurse Practitioner

## 2022-05-20 ENCOUNTER — Ambulatory Visit
Admission: RE | Admit: 2022-05-20 | Discharge: 2022-05-20 | Disposition: A | Discharge status: Home / Self Care | Payer: Medicare Other | Source: Ambulatory Visit | Attending: Nurse Practitioner | Admitting: Nurse Practitioner

## 2022-05-20 DIAGNOSIS — Z853 Personal history of malignant neoplasm of breast: Secondary | ICD-10-CM | POA: Insufficient documentation

## 2022-05-20 DIAGNOSIS — Z1231 Encounter for screening mammogram for malignant neoplasm of breast: Secondary | ICD-10-CM

## 2022-05-20 DIAGNOSIS — R222 Localized swelling, mass and lump, trunk: Secondary | ICD-10-CM | POA: Insufficient documentation

## 2022-05-21 ENCOUNTER — Other Ambulatory Visit: Payer: Self-pay | Admitting: Nurse Practitioner

## 2022-05-21 DIAGNOSIS — R928 Other abnormal and inconclusive findings on diagnostic imaging of breast: Secondary | ICD-10-CM

## 2022-05-21 DIAGNOSIS — N63 Unspecified lump in unspecified breast: Secondary | ICD-10-CM

## 2022-05-27 ENCOUNTER — Ambulatory Visit
Admission: RE | Admit: 2022-05-27 | Discharge: 2022-05-27 | Disposition: A | Discharge status: Home / Self Care | Payer: Medicare Other | Source: Ambulatory Visit | Attending: Nurse Practitioner | Admitting: Nurse Practitioner

## 2022-05-27 DIAGNOSIS — R928 Other abnormal and inconclusive findings on diagnostic imaging of breast: Secondary | ICD-10-CM

## 2022-05-27 DIAGNOSIS — N63 Unspecified lump in unspecified breast: Secondary | ICD-10-CM | POA: Diagnosis present

## 2022-05-27 HISTORY — PX: BREAST BIOPSY: SHX20

## 2022-05-27 MED ORDER — LIDOCAINE HCL (PF) 1 % IJ SOLN
5.0000 mL | Freq: Once | INTRAMUSCULAR | Status: AC
  Administered 2022-05-27: 5 mL via INTRADERMAL
  Filled 2022-05-27: qty 5

## 2022-05-27 MED ORDER — LIDOCAINE-EPINEPHRINE 1 %-1:100000 IJ SOLN
10.0000 mL | Freq: Once | INTRAMUSCULAR | Status: AC
  Administered 2022-05-27: 10 mL via INTRADERMAL
  Filled 2022-05-27: qty 10

## 2022-05-28 ENCOUNTER — Encounter: Payer: Self-pay | Admitting: *Deleted

## 2022-05-28 DIAGNOSIS — C50919 Malignant neoplasm of unspecified site of unspecified female breast: Secondary | ICD-10-CM

## 2022-05-28 LAB — SURGICAL PATHOLOGY

## 2022-05-28 NOTE — Progress Notes (Signed)
Received referral for newly diagnosed recurrent breast cancer from [facility].  Navigation initiated.  Ms. Kriebel will see Dr.Yu on Tuesday at 9:30.  She would like to wait for surgical referral until after she meets with Dr. Tasia Catchings.

## 2022-05-29 ENCOUNTER — Encounter: Payer: Self-pay | Admitting: *Deleted

## 2022-05-29 NOTE — Progress Notes (Signed)
Ms. Vock called requesting to see Dr. Janese Banks.  Appt. Has been changed to Monday 2/26 at 2:30.

## 2022-06-01 ENCOUNTER — Encounter: Payer: Self-pay | Admitting: Oncology

## 2022-06-01 ENCOUNTER — Encounter: Payer: Self-pay | Admitting: *Deleted

## 2022-06-01 ENCOUNTER — Inpatient Hospital Stay: Payer: Medicare Other

## 2022-06-01 ENCOUNTER — Inpatient Hospital Stay: Payer: Medicare Other | Attending: Oncology | Admitting: Oncology

## 2022-06-01 VITALS — BP 149/63 | HR 61 | Temp 96.1°F | Resp 16 | Ht 67.0 in | Wt 156.7 lb

## 2022-06-01 DIAGNOSIS — Z170 Estrogen receptor positive status [ER+]: Secondary | ICD-10-CM | POA: Insufficient documentation

## 2022-06-01 DIAGNOSIS — C50911 Malignant neoplasm of unspecified site of right female breast: Secondary | ICD-10-CM | POA: Diagnosis present

## 2022-06-01 DIAGNOSIS — Z780 Asymptomatic menopausal state: Secondary | ICD-10-CM

## 2022-06-01 DIAGNOSIS — C50919 Malignant neoplasm of unspecified site of unspecified female breast: Secondary | ICD-10-CM

## 2022-06-01 DIAGNOSIS — Z7189 Other specified counseling: Secondary | ICD-10-CM

## 2022-06-01 DIAGNOSIS — Z79811 Long term (current) use of aromatase inhibitors: Secondary | ICD-10-CM | POA: Diagnosis not present

## 2022-06-01 DIAGNOSIS — Z923 Personal history of irradiation: Secondary | ICD-10-CM | POA: Diagnosis not present

## 2022-06-01 DIAGNOSIS — Z9011 Acquired absence of right breast and nipple: Secondary | ICD-10-CM | POA: Diagnosis not present

## 2022-06-01 LAB — COMPREHENSIVE METABOLIC PANEL
ALT: 22 U/L (ref 0–44)
AST: 25 U/L (ref 15–41)
Albumin: 4.2 g/dL (ref 3.5–5.0)
Alkaline Phosphatase: 70 U/L (ref 38–126)
Anion gap: 12 (ref 5–15)
BUN: 25 mg/dL — ABNORMAL HIGH (ref 8–23)
CO2: 27 mmol/L (ref 22–32)
Calcium: 9.7 mg/dL (ref 8.9–10.3)
Chloride: 100 mmol/L (ref 98–111)
Creatinine, Ser: 1.1 mg/dL — ABNORMAL HIGH (ref 0.44–1.00)
GFR, Estimated: 50 mL/min — ABNORMAL LOW (ref >60)
Glucose, Bld: 114 mg/dL — ABNORMAL HIGH (ref 70–99)
Potassium: 4.3 mmol/L (ref 3.5–5.1)
Sodium: 139 mmol/L (ref 135–145)
Total Bilirubin: 0.5 mg/dL (ref 0.3–1.2)
Total Protein: 7.5 g/dL (ref 6.5–8.1)

## 2022-06-01 LAB — CBC WITH DIFFERENTIAL/PLATELET
Abs Immature Granulocytes: 0.04 10*3/uL (ref 0.00–0.07)
Basophils Absolute: 0.1 10*3/uL (ref 0.0–0.1)
Basophils Relative: 1 %
Eosinophils Absolute: 0.3 10*3/uL (ref 0.0–0.5)
Eosinophils Relative: 4 %
HCT: 42.4 % (ref 36.0–46.0)
Hemoglobin: 13.7 g/dL (ref 12.0–15.0)
Immature Granulocytes: 1 %
Lymphocytes Relative: 31 %
Lymphs Abs: 2.5 10*3/uL (ref 0.7–4.0)
MCH: 28.4 pg (ref 26.0–34.0)
MCHC: 32.3 g/dL (ref 30.0–36.0)
MCV: 88 fL (ref 80.0–100.0)
Monocytes Absolute: 0.7 10*3/uL (ref 0.1–1.0)
Monocytes Relative: 9 %
Neutro Abs: 4.4 10*3/uL (ref 1.7–7.7)
Neutrophils Relative %: 54 %
Platelets: 313 10*3/uL (ref 150–400)
RBC: 4.82 MIL/uL (ref 3.87–5.11)
RDW: 14.1 % (ref 11.5–15.5)
WBC: 8 10*3/uL (ref 4.0–10.5)
nRBC: 0 % (ref 0.0–0.2)

## 2022-06-01 MED ORDER — LETROZOLE 2.5 MG PO TABS: 2.5000 mg | ORAL_TABLET | Freq: Every day | ORAL | 3 refills | Status: DC

## 2022-06-01 NOTE — Progress Notes (Signed)
No concerns for the provider today.

## 2022-06-01 NOTE — Progress Notes (Signed)
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  She had lumpectomy, postlumpectomy radiation and 5 years of tamoxifen back then.  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  Tumor was T1b N0 M0.  She underwent mastectomy and was started on letrozole by Dr. Oliva Bustard.  She did not require postmastectomy radiation or chemotherapy.  It appears that patient did not complete 5 years of letrozole and was lost to follow-up.  Patient self palpated a mass along her mastectomy scar which led toLeft breast mammogram as well as right chest wall ultrasound.  Mammogram showed 1.3 cm medial right breast/chest mass which contacts and appears to invade underlying pectoralis muscle.  No abnormal appearing right axillary lymph nodes.  No mammographic appearance of left breast malignancy.  Patient is independent of her ADLs and IADLs.  She has not undergone genetic testing.  Her daughter has a history of breast cancer s/p bilateral mastectomy and she has undergone genetic testing that was negative.  ECOG PS- 1  Pain scale- 0   Review of systems- Review of Systems  Constitutional:  Negative for chills, fever, malaise/fatigue and weight loss.  HENT:  Negative for congestion, ear discharge and nosebleeds.   Eyes:  Negative for blurred vision.  Respiratory:  Negative for cough, hemoptysis, sputum  production, shortness of breath and wheezing.   Cardiovascular:  Negative for chest pain, palpitations, orthopnea and claudication.  Gastrointestinal:  Negative for abdominal pain, blood in stool, constipation, diarrhea, heartburn, melena, nausea and vomiting.  Genitourinary:  Negative for dysuria, flank pain, frequency, hematuria and urgency.  Musculoskeletal:  Negative for back pain, joint pain and myalgias.  Skin:  Negative for rash.  Neurological:  Negative for dizziness, tingling, focal weakness, seizures, weakness and headaches.  Endo/Heme/Allergies:  Does not bruise/bleed easily.  Psychiatric/Behavioral:  Negative for depression and suicidal ideas. The patient does not have insomnia.     Allergies  Allergen Reactions   Alendronate Diarrhea    Other reaction(s): Dizziness, Other (See Comments) Joint pain   Nadolol     Other reaction(s): Dizziness Per patient, "Felt like the back of head was going to explode"   Statins     Other reaction(s): Muscle Pain Mevacor-myalgias.  Crestor-"Just didn't feel right."    Patient Active Problem List   Diagnosis Date Noted   Chronic venous insufficiency 09/17/2021   Lymphedema 09/17/2021   Aortic insufficiency 10/05/2018   PVC's (premature ventricular contractions) 09/16/2018   History of right breast cancer 07/19/2018   Gout 07/05/2017   Malignant neoplasm of breast ([place]) 03/06/2015   Type 2 diabetes mellitus ([place]) 03/06/2015   Hypercholesterolemia 03/06/2015   S/P mastectomy 08/21/2014   Breast cancer ([place]) 08/21/2014   Arthritis 08/08/2014   Diabetes mellitus, type 2 ([place]) 08/08/2014   Hypercholesteremia 08/08/2014   Hypertension 08/08/2014   Hypothyroidism 08/08/2014   Osteopenia 04/06/2001     Past Medical History:  Diagnosis Date   Arthritis  Breast cancer ([place]) 2000 and 2016   right breast ca twice.  Mastectomy in 2016   Cataract    Diabetes mellitus without complication (HCC)    GERD (gastroesophageal reflux  disease)    Hx of migraines    from ages [age]   Hypercholesteremia    Hypertension    Kidney stone on right side    Personal history of radiation therapy 2000   right breast ca   Thyroid disease    Varicose vein of leg      Past Surgical History:  Procedure Laterality Date   BREAST BIOPSY Right 2000   breast ca   BREAST BIOPSY Right 2016   invasive mammary-mastectomy   BREAST BIOPSY Right 05/27/2022   [place] RT BREAST BX W LOC DEV 1ST LESION IMG BX SPEC US GUIDE 05/27/2022 [facility]   BREAST LUMPECTOMY Right 2000   breast ca with rad tx and tamoxifen   CATARACT EXTRACTION W/ INTRAOCULAR LENS  IMPLANT, BILATERAL     MASTECTOMY Right 2016   + complete mastectomy and letrozole tx   SIMPLE MASTECTOMY WITH AXILLARY SENTINEL NODE BIOPSY Right 08/21/2014   Procedure: SIMPLE MASTECTOMY WITH AXILLARY SENTINEL NODE BIOPSY;  Surgeon: Rochel Brome, MD;  Location: [facility] ORS;  Service: General;  Laterality: Right;   TONSILLECTOMY     VARICOSE VEIN SURGERY Left    laser surgery    Social History   Socioeconomic History   Marital status: Married    Spouse name: Not on file   Number of children: Not on file   Years of education: Not on file   Highest education level: Not on file  Occupational History   Not on file  Tobacco Use   Smoking status: Former    Packs/day: 0.50    Years: 10.00    Total pack years: 5.00    Types: Cigarettes    Quit date: 08/05/1970    Years since quitting: 51.8   Smokeless tobacco: Never  Vaping Use   Vaping Use: Never used  Substance and Sexual Activity   Alcohol use: No   Drug use: No   Sexual activity: Yes    Birth control/protection: Post-menopausal  Other Topics Concern   Not on file  Social History Narrative   Not on file   Social Determinants of Health   Financial Resource Strain: Not on file  Food Insecurity: No Food Insecurity (06/01/2022)   Hunger Vital Sign    Worried About Running Out of Food in the Last Year: Never true    Ran  Out of Food in the Last Year: Never true  Transportation Needs: No Transportation Needs (06/01/2022)   PRAPARE - Transportation    Lack of Transportation (Medical): No    Lack of Transportation (Non-Medical): No  Physical Activity: Not on file  Stress: Not on file  Social Connections: Not on file  Intimate Partner Violence: Not At Risk (06/01/2022)   Humiliation, Afraid, Rape, and Kick questionnaire    Fear of Current or Ex-Partner: No    Emotionally Abused: No    Physically Abused: No    Sexually Abused: No     Family History  Problem Relation Age of Onset   Osteoarthritis Mother    Heart disease Father    Dementia Sister    Breast cancer Daughter 41     Current Outpatient Medications:    acetaminophen (TYLENOL) 500 MG tablet, Take 500 mg by mouth every 6 (six) hours as needed for mild pain., Disp: , Rfl:  allopurinol (ZYLOPRIM) 300 MG tablet, Take 300 mg by mouth daily., Disp: , Rfl:    colchicine 0.6 MG tablet, Take 0.6 mg by mouth as needed., Disp: , Rfl:    febuxostat (ULORIC) 40 MG tablet, Take by mouth., Disp: , Rfl:    furosemide (LASIX) 20 MG tablet, Take by mouth., Disp: , Rfl:    levothyroxine (SYNTHROID, LEVOTHROID) 100 MCG tablet, Take 100 mcg by mouth daily before breakfast., Disp: , Rfl:    lisinopril (ZESTRIL) 20 MG tablet, Take by mouth., Disp: , Rfl:    lisinopril-hydrochlorothiazide (PRINZIDE,ZESTORETIC) 10-12.5 MG per tablet, Take 10-12.5 tablets by mouth every morning. , Disp: , Rfl:    meloxicam (MOBIC) 7.5 MG tablet, Take 7.5 mg by mouth daily., Disp: , Rfl:    metFORMIN (GLUCOPHAGE) 500 MG tablet, Take 500 mg by mouth every morning. , Disp: , Rfl:    metoprolol succinate (TOPROL-XL) 25 MG 24 hr tablet, Take 25 mg by mouth daily., Disp: , Rfl:    traMADol (ULTRAM) 50 MG tablet, 1-2 tablets po q 8 hours prn, Disp: 15 tablet, Rfl: 0   amoxicillin (AMOXIL) 875 MG tablet, Take 1 tablet (875 mg total) by mouth 2 (two) times daily. (Patient not taking: Reported  on 06/01/2022), Disp: 14 tablet, Rfl: 0   Physical exam:  Vitals:   06/01/22 1419 06/01/22 1427 06/01/22 1428  BP: (!) 183/63 (!) 198/55 (!) 149/63  Pulse: 63 65 61  Resp: 16    Temp: (!) 96.1 F (35.6 C)    TempSrc: Tympanic    SpO2: 100%    Weight: 156 lb 11.2 oz (71.1 kg)    Height: '5\' 7"'$  (1.702 m)     Physical Exam Cardiovascular:     Rate and Rhythm: Normal rate and regular rhythm.     Heart sounds: Normal heart sounds.  Pulmonary:     Effort: Pulmonary effort is normal.     Breath sounds: Normal breath sounds.  Abdominal:     General: Bowel sounds are normal.     Palpations: Abdomen is soft.  Skin:    General: Skin is warm and dry.  Neurological:     Mental Status: She is alert and oriented to person, place, and time.   Breast/chest wall exam: There is a palpable 1.5 cm mass  at the medial edge of the right mastectomy scar.  No palpable bilateral axillary adenopathy.  No palpable masses in the left breast.  Left nipple is inverted and appears chronic       Latest Ref Rng & Units 06/01/2022    3:21 PM  CMP  Glucose 70 - 99 mg/dL 114   BUN 8 - 23 mg/dL 25   Creatinine 0.44 - 1.00 mg/dL 1.10   Sodium 135 - 145 mmol/L 139   Potassium 3.5 - 5.1 mmol/L 4.3   Chloride 98 - 111 mmol/L 100   CO2 22 - 32 mmol/L 27   Calcium 8.9 - 10.3 mg/dL 9.7   Total Protein 6.5 - 8.1 g/dL 7.5   Total Bilirubin 0.3 - 1.2 mg/dL 0.5   Alkaline Phos 38 - 126 U/L 70   AST 15 - 41 U/L 25   ALT 0 - 44 U/L 22       Latest Ref Rng & Units 06/01/2022    3:21 PM  CBC  WBC 4.0 - 10.5 K/uL 8.0   Hemoglobin 12.0 - 15.0 g/dL 13.7   Hematocrit 36.0 - 46.0 % 42.4   Platelets 150 - 400  K/uL 313     No images are attached to the encounter.  [place] RT BREAST BX W LOC DEV 1ST LESION IMG BX SPEC US GUIDE  Addendum Date: 05/28/2022   ADDENDUM REPORT: 05/28/2022 15:57 ADDENDUM: PATHOLOGY revealed: A. BREAST, RIGHT, MASTECTOMY BED; ULTRASOUND-GUIDED CORE NEEDLE BIOPSY: - INVASIVE MAMMARY CARCINOMA,  NO SPECIAL TYPE. Size of invasive carcinoma: 11 mm in this sample. Grade 2. Ductal carcinoma in situ: Not identified. Lymphovascular invasion: Not identified. Pathology results are CONCORDANT with imaging findings, per Dr. Beryle Flock. Pathology results and recommendations were discussed with patient via telephone on 05/28/2022. Patient reported biopsy site doing well with no adverse symptoms, and only slight tenderness at the site. Post biopsy care instructions were reviewed, questions were answered and my direct phone number was provided. Patient was instructed to call [facility] for any additional questions or concerns related to biopsy site. RECOMMENDATIONS: 1. Surgical and oncological consultation. Request for surgical and oncological consultation relayed to Casper Harrison RN at [facility] by Electa Sniff RN on 05/28/2022. Pathology results reported by Electa Sniff RN on 05/28/2022. Electronically Signed   By: Beryle Flock M.D.   On: 05/28/2022 15:57   Addendum Date: 05/27/2022   ADDENDUM REPORT: 05/27/2022 11:08 ADDENDUM: Addendum to state that a follow-up two-view mammogram was not performed given the patient is status post right mastectomy. The biopsy marking clip is clearly visualized on the ultrasound images within the center of the mass. Electronically Signed   By: Beryle Flock M.D.   On: 05/27/2022 11:08   Result Date: 05/28/2022 CLINICAL DATA:  82 year old female with a palpable mass in her right mastectomy bed. She is status post right total mastectomy in 2016 with 1 mm of residual carcinoma noted on surgical pathology. EXAM: ULTRASOUND GUIDED RIGHT BREAST CORE NEEDLE BIOPSY COMPARISON:  Previous exam(s). PROCEDURE: I met with the patient and we discussed the procedure of ultrasound-guided biopsy, including benefits and alternatives. We discussed the high likelihood of a successful procedure. We discussed the risks of the procedure, including infection, bleeding,  tissue injury, clip migration, and inadequate sampling. Informed written consent was given. The usual time-out protocol was performed immediately prior to the procedure. Lesion quadrant: Upper outer quadrant Using sterile technique and 1% Lidocaine as local anesthetic, under direct ultrasound visualization, a 14 gauge spring-loaded device was used to perform biopsy of a palpable mass in the right mastectomy bed/chest wall using a lateral approach. At the conclusion of the procedure, a HydroMARK butterfly shaped tissue marker clip was deployed into the biopsy cavity. Follow up 2 view mammogram was performed and dictated separately. IMPRESSION: Ultrasound guided biopsy of a palpable mass in the right mastectomy bed/chest wall. No apparent complications. Electronically Signed: By: Beryle Flock M.D. On: 05/27/2022 10:26   US BREAST LTD UNI RIGHT INC AXILLA  Result Date: 05/20/2022 CLINICAL DATA:  82 year old female with history of RIGHT breast cancer and mastectomy. Now with palpable mass in the INNER RIGHT breast/chest. Also for annual LEFT mammogram. EXAM: DIGITAL DIAGNOSTIC UNILATERAL LEFT MAMMOGRAM WITH TOMOSYNTHESIS; ULTRASOUND RIGHT BREAST LIMITED TECHNIQUE: Left digital diagnostic mammography and breast tomosynthesis was performed.; Targeted ultrasound examination of the right breast was performed COMPARISON:  Previous exam(s). ACR Breast Density Category b: There are scattered areas of fibroglandular density. FINDINGS: Full field and spot compression views of the LEFT breast demonstrate no suspicious mammographic abnormality. On physical exam, a firm palpable mass is identified in the MEDIAL RIGHT breast/chest along the MEDIAL edge of the mastectomy scar.  Targeted ultrasound is performed, showing a 1.3 x 0.9 x 0.9 cm irregular hypoechoic mass within the MEDIAL RIGHT breast/chest near the MEDIAL edge of the mastectomy scar. This mass contacts and appears to invade the underlying pectoralis muscle. No  abnormal RIGHT axillary lymph nodes are noted. IMPRESSION: 1. Suspicious 1.3 cm MEDIAL RIGHT breast/chest mass which contacts and appears to invade the underlying pectoralis muscle. Tissue sampling is recommended. 2. No abnormal appearing RIGHT axillary lymph nodes. 3. No mammographic evidence of LEFT breast malignancy. RECOMMENDATION: Ultrasound-guided RIGHT breast biopsy, which will be arranged. I have discussed the findings and recommendations with the patient. If applicable, a reminder letter will be sent to the patient regarding the next appointment. BI-RADS CATEGORY  4: Suspicious. Electronically Signed   By: Margarette Canada M.D.   On: 05/20/2022 11:11  MM DIAG BREAST TOMO UNI LEFT  Result Date: 05/20/2022 CLINICAL DATA:  82 year old female with history of RIGHT breast cancer and mastectomy. Now with palpable mass in the INNER RIGHT breast/chest. Also for annual LEFT mammogram. EXAM: DIGITAL DIAGNOSTIC UNILATERAL LEFT MAMMOGRAM WITH TOMOSYNTHESIS; ULTRASOUND RIGHT BREAST LIMITED TECHNIQUE: Left digital diagnostic mammography and breast tomosynthesis was performed.; Targeted ultrasound examination of the right breast was performed COMPARISON:  Previous exam(s). ACR Breast Density Category b: There are scattered areas of fibroglandular density. FINDINGS: Full field and spot compression views of the LEFT breast demonstrate no suspicious mammographic abnormality. On physical exam, a firm palpable mass is identified in the MEDIAL RIGHT breast/chest along the MEDIAL edge of the mastectomy scar. Targeted ultrasound is performed, showing a 1.3 x 0.9 x 0.9 cm irregular hypoechoic mass within the MEDIAL RIGHT breast/chest near the MEDIAL edge of the mastectomy scar. This mass contacts and appears to invade the underlying pectoralis muscle. No abnormal RIGHT axillary lymph nodes are noted. IMPRESSION: 1. Suspicious 1.3 cm MEDIAL RIGHT breast/chest mass which contacts and appears to invade the underlying pectoralis  muscle. Tissue sampling is recommended. 2. No abnormal appearing RIGHT axillary lymph nodes. 3. No mammographic evidence of LEFT breast malignancy. RECOMMENDATION: Ultrasound-guided RIGHT breast biopsy, which will be arranged. I have discussed the findings and recommendations with the patient. If applicable, a reminder letter will be sent to the patient regarding the next appointment. BI-RADS CATEGORY  4: Suspicious. Electronically Signed   By: Margarette Canada M.D.   On: 05/20/2022 11:11   Assessment and plan- Patient is a 82 y.o. female with history of prior breast cancer now presenting with biopsy-proven recurrence along the mastectomy scar  Patient had a history of right breast DCIS in 2000 and stage I invasive mammary carcinoma of the right breast in 2016.  She now has a 1.3 cm mass along the medial edge of the mastectomy scar which was biopsied and was consistent with invasive mammary carcinoma grade 2 ER greater than 90% positive, PR 61 to 70% positive and HER2 negative.  The mass appears to beIn contact/invading the pectoralis muscle.  Patient would ideally be deferred from excision of this recurrent malignancy and consideration for postmastectomy radiation.  I am referring the patient to Dr. Ernst Bowler from [facility].  Her daughter has also received care with her.  Her tumor is strongly ER/PR positive and HER2 negative and therefore I am starting her on letrozole today.  Discussed risks and benefits of letrozole including all but not limited to fatigue, hot flashes, arthralgias and worsening bone health.  I will tentatively see her back in 1 month with a bone density scan prior.  Typically strongly hormone  positive tremors do not respond well to chemotherapy.  At her age patient also does not wish to undergo any chemotherapy.  I am therefore holding off on any Oncotype testing and consideration for neoadjuvant/adjuvant chemotherapy for her.  I do recommend the patient should take calcium 1200 mg along with  vitamin D 800 international units while on letrozole.  Endocrine therapy can control the growth of her existing cancer and potentially downsize the tumor but whether it would pull the tumor away from her muscle involvement is presently unclear.  It is also usually slow acting and results may not be noticeable for few months.  Patient did not have any involvement of right axillary lymph nodes and therefore the likelihood of having any systemic disease is low.  However I will get a CT chest abdomen pelvis with contrast and bone scan at this time.  I am getting basic labs including CBC with differential CMP and CA 15-3 today.  Patient declined genetic testing at this time.  Her daughter has already undergone genetic testing that was negative   Thank you for this kind referral and the opportunity to participate in the care of this patient   Visit Diagnosis 1. Recurrent breast cancer, right ([place])   2. Goals of care, counseling/discussion     Dr. Randa Evens, MD, MPH [facility] at [facility] [id number] 06/01/2022

## 2022-06-01 NOTE — Progress Notes (Signed)
Accompanied patient and family to initial medical oncology appointment.  Care plan summary given to patient.   Referral faxed to Dr. Dorann Ou per patient preference.

## 2022-06-02 ENCOUNTER — Inpatient Hospital Stay: Payer: Medicare Other

## 2022-06-02 ENCOUNTER — Inpatient Hospital Stay: Payer: Medicare Other | Admitting: Oncology

## 2022-06-02 LAB — CANCER ANTIGEN 15-3: CA 15-3: 10.2 U/mL (ref 0.0–25.0)

## 2022-06-19 ENCOUNTER — Ambulatory Visit
Admission: RE | Admit: 2022-06-19 | Discharge: 2022-06-19 | Disposition: A | Discharge status: Home / Self Care | Payer: Medicare Other | Source: Ambulatory Visit | Attending: Oncology | Admitting: Oncology

## 2022-06-19 ENCOUNTER — Encounter
Admission: RE | Admit: 2022-06-19 | Discharge: 2022-06-19 | Disposition: A | Discharge status: Home / Self Care | Payer: Medicare Other | Source: Ambulatory Visit | Attending: Oncology | Admitting: Oncology

## 2022-06-19 DIAGNOSIS — C50911 Malignant neoplasm of unspecified site of right female breast: Secondary | ICD-10-CM | POA: Diagnosis present

## 2022-06-19 MED ORDER — TECHNETIUM TC 99M MEDRONATE IV KIT
20.0000 | PACK | Freq: Once | INTRAVENOUS | Status: AC | PRN
  Administered 2022-06-19: 21.27 via INTRAVENOUS

## 2022-06-19 MED ORDER — IOHEXOL 300 MG/ML  SOLN
100.0000 mL | Freq: Once | INTRAMUSCULAR | Status: AC | PRN
  Administered 2022-06-19: 100 mL via INTRAVENOUS

## 2022-07-02 ENCOUNTER — Other Ambulatory Visit: Payer: Medicare Other

## 2022-07-02 ENCOUNTER — Telehealth: Payer: Self-pay | Admitting: *Deleted

## 2022-07-02 NOTE — Telephone Encounter (Signed)
Dr. Janese Parks had wanted to know is she did good with surgery and if she wanted to get radiation at [facility] or [facility]. I called the pt. And said who I was and wanted to check on how she is doing since she had the surgery on 3/26. She says she is doing ok today. I asked if she was going to get radiation. She says she is. I asked her if she wanted to get it at cancer center in [place] or at [facility]. She said that she was staying with [facility]. She felt that since she got her surgery there then she wanted to have the rest in 1 place and all the records to be at one place. She did say that she liked Dr. Janese Parks and she was nice, and she did meet Dr. Baruch Gouty and she said he was nice but she feels that it is best to keep her care in 1 place.I told her that I hope everything goes well for and if she needs something in the future she can call us again.

## 2022-07-13 ENCOUNTER — Encounter: Payer: Self-pay | Admitting: Oncology

## 2022-07-13 ENCOUNTER — Ambulatory Visit: Payer: Medicare Other | Admitting: Oncology

## 2022-07-13 NOTE — Telephone Encounter (Signed)
I did send a message that I will see her this friday

## 2022-07-17 ENCOUNTER — Inpatient Hospital Stay: Payer: Medicare Other | Attending: Oncology | Admitting: Oncology

## 2022-07-17 ENCOUNTER — Encounter: Payer: Self-pay | Admitting: *Deleted

## 2022-07-17 ENCOUNTER — Encounter: Payer: Self-pay | Admitting: Oncology

## 2022-07-17 VITALS — BP 198/77 | HR 70 | Temp 97.8°F | Resp 18 | Ht 67.0 in | Wt 149.2 lb

## 2022-07-17 DIAGNOSIS — Z170 Estrogen receptor positive status [ER+]: Secondary | ICD-10-CM | POA: Insufficient documentation

## 2022-07-17 DIAGNOSIS — Z79811 Long term (current) use of aromatase inhibitors: Secondary | ICD-10-CM | POA: Insufficient documentation

## 2022-07-17 DIAGNOSIS — Z923 Personal history of irradiation: Secondary | ICD-10-CM | POA: Insufficient documentation

## 2022-07-17 DIAGNOSIS — Z7189 Other specified counseling: Secondary | ICD-10-CM

## 2022-07-17 DIAGNOSIS — C50911 Malignant neoplasm of unspecified site of right female breast: Secondary | ICD-10-CM | POA: Insufficient documentation

## 2022-07-17 DIAGNOSIS — K5900 Constipation, unspecified: Secondary | ICD-10-CM | POA: Diagnosis not present

## 2022-07-17 DIAGNOSIS — Z87891 Personal history of nicotine dependence: Secondary | ICD-10-CM | POA: Insufficient documentation

## 2022-07-17 NOTE — Progress Notes (Signed)
No concerns for the provider today. 

## 2022-07-17 NOTE — Progress Notes (Signed)
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  She had lumpectomy, postlumpectomy radiation and 5 years of tamoxifen back then.  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  Tumor was T1b N0 M0.  She underwent mastectomy and was started on letrozole by Dr. Doylene Canning.  She did not require postmastectomy radiation or chemotherapy.  It appears that patient did not complete 5 years of letrozole and was lost to follow-up.  Patient self palpated a mass along her mastectomy scar which led toLeft breast mammogram as well as right chest wall ultrasound.  Mammogram showed 1.3 cm medial right breast/chest mass which contacts and appears to invade underlying pectoralis muscle.  No abnormal appearing right axillary lymph nodes.  No mammographic appearance of left breast malignancy.   Systemic scans were negative for metastatic disease.  Patient was seen by Dr. Dellis Anes at [facility] and underwent definitive excision of the postmastectomy recurrence.  Final pathSurgery showed 1.6 cm grade 2 invasive mammary carcinoma with negative margins.  ER 100% positive, PR 80% positive and HER2 negative.  Patient was also seen by radiation oncology at [facility] and declined adjuvant radiation  Interval history-patient was on letrozole even prior to her surgery starting in February 2024.  She  reports feeling a lightheaded occasionally after starting letrozole.  Also reports that her head feels heavy and she has been constipated.  She is unsure if she would want to continue letrozole at this time  ECOG PS- 1 Pain scale- 0   Review of systems- Review of Systems  Constitutional:  Positive for malaise/fatigue. Negative for chills, fever and weight loss.  HENT:  Negative for congestion, ear discharge and nosebleeds.   Eyes:  Negative for blurred vision.  Respiratory:  Negative for cough, hemoptysis, sputum production, shortness of breath and wheezing.   Cardiovascular:  Negative for chest pain, palpitations, orthopnea and claudication.  Gastrointestinal:  Negative for abdominal pain, blood in stool, constipation, diarrhea, heartburn, melena, nausea and vomiting.  Genitourinary:  Negative for dysuria, flank pain, frequency, hematuria and urgency.  Musculoskeletal:  Negative for back pain, joint pain and myalgias.  Skin:  Negative for rash.  Neurological:  Negative for dizziness, tingling, focal weakness, seizures, weakness and headaches.  Endo/Heme/Allergies:  Does not bruise/bleed easily.  Psychiatric/Behavioral:  Negative for depression and suicidal ideas. The patient does not have insomnia.       Allergies  Allergen Reactions   Alendronate Diarrhea    Other reaction(s): Dizziness, Other (See Comments) Joint pain   Nadolol     Other reaction(s): Dizziness Per patient, "Felt like the back of head was going to explode"   Statins     Other reaction(s): Muscle Pain Mevacor-myalgias.  Crestor-"Just didn't feel right."     Past Medical History:  Diagnosis Date   Arthritis    Breast cancer 2000 and 2016   right breast ca twice.  Mastectomy in 2016  Cataract    Diabetes mellitus without complication    GERD (gastroesophageal reflux disease)    Hx of migraines    from ages [age]   Hypercholesteremia    Hypertension    Kidney stone on right side    Personal history of  radiation therapy 2000   right breast ca   Thyroid disease    Varicose vein of leg      Past Surgical History:  Procedure Laterality Date   BREAST BIOPSY Right 2000   breast ca   BREAST BIOPSY Right 2016   invasive mammary-mastectomy   BREAST BIOPSY Right 05/27/2022   [place] RT BREAST BX W LOC DEV 1ST LESION IMG BX SPEC US GUIDE 05/27/2022 [facility]   BREAST LUMPECTOMY Right 2000   breast ca with rad tx and tamoxifen   CATARACT EXTRACTION W/ INTRAOCULAR LENS  IMPLANT, BILATERAL     MASTECTOMY Right 2016   + complete mastectomy and letrozole tx   SIMPLE MASTECTOMY WITH AXILLARY SENTINEL NODE BIOPSY Right 08/21/2014   Procedure: SIMPLE MASTECTOMY WITH AXILLARY SENTINEL NODE BIOPSY;  Surgeon: Renda Rolls, MD;  Location: [facility] ORS;  Service: General;  Laterality: Right;   TONSILLECTOMY     VARICOSE VEIN SURGERY Left    laser surgery    Social History   Socioeconomic History   Marital status: Married    Spouse name: Not on file   Number of children: Not on file   Years of education: Not on file   Highest education level: Not on file  Occupational History   Not on file  Tobacco Use   Smoking status: Former    Packs/day: 0.50    Years: 10.00    Additional pack years: 0.00    Total pack years: 5.00    Types: Cigarettes    Quit date: 08/05/1970    Years since quitting: 51.9   Smokeless tobacco: Never  Vaping Use   Vaping Use: Never used  Substance and Sexual Activity   Alcohol use: No   Drug use: No   Sexual activity: Yes    Birth control/protection: Post-menopausal  Other Topics Concern   Not on file  Social History Narrative   Not on file   Social Determinants of Health   Financial Resource Strain: Not on file  Food Insecurity: No Food Insecurity (06/01/2022)   Hunger Vital Sign    Worried About Running Out of Food in the Last Year: Never true    Ran Out of Food in the Last Year: Never true  Transportation Needs: No Transportation Needs (06/01/2022)   PRAPARE -  Transportation    Lack of Transportation (Medical): No    Lack of Transportation (Non-Medical): No  Physical Activity: Not on file  Stress: Not on file  Social Connections: Not on file  Intimate Partner Violence: Not At Risk (06/01/2022)   Humiliation, Afraid, Rape, and Kick questionnaire    Fear of Current or Ex-Partner: No    Emotionally Abused: No    Physically Abused: No    Sexually Abused: No    Family History  Problem Relation Age of Onset   Osteoarthritis Mother    Heart disease Father    Dementia Sister    Breast cancer Daughter 62     Current Outpatient Medications:    acetaminophen (TYLENOL) 500 MG tablet, Take 500 mg by mouth every 6 (six) hours as needed for mild pain., Disp: , Rfl:    allopurinol (ZYLOPRIM) 300 MG tablet, Take 300 mg by mouth daily.,  Disp: , Rfl:    colchicine 0.6 MG tablet, Take 0.6 mg by mouth as needed., Disp: , Rfl:    furosemide (LASIX) 20 MG tablet, Take by mouth., Disp: , Rfl:    letrozole (FEMARA) 2.5 MG tablet, Take 1 tablet (2.5 mg total) by mouth daily., Disp: 30 tablet, Rfl: 3   levothyroxine (SYNTHROID, LEVOTHROID) 100 MCG tablet, Take 100 mcg by mouth daily before breakfast., Disp: , Rfl:    lisinopril (ZESTRIL) 20 MG tablet, Take by mouth., Disp: , Rfl:    metFORMIN (GLUCOPHAGE) 500 MG tablet, Take 500 mg by mouth every morning. , Disp: , Rfl:    lisinopril-hydrochlorothiazide (PRINZIDE,ZESTORETIC) 10-12.5 MG per tablet, Take 10-12.5 tablets by mouth every morning. , Disp: , Rfl:   Physical exam:  Vitals:   07/17/22 0859 07/17/22 0904  BP: (!) 204/65 (!) 198/77  Pulse: 70   Resp: 18   Temp: 97.8 F (36.6 C)   TempSrc: Tympanic   SpO2: 100%   Weight: 149 lb 3.2 oz (67.7 kg)   Height:  (1.702 m)    Physical Exam Cardiovascular:     Rate and Rhythm: Normal rate and regular rhythm.     Heart sounds: Normal heart sounds.  Pulmonary:     Effort: Pulmonary effort is normal.     Breath sounds: Normal breath sounds.   Skin:    General: Skin is warm and dry.  Neurological:     Mental Status: She is alert and oriented to person, place, and time.   Chest wall exam: Postmastectomy wound is healing well without any signs of infection.  No overt seroma noted on today's exam although there is mild fullness at the medial aspect of the scar     Latest Ref Rng & Units 06/01/2022    3:21 PM  CMP  Glucose 70 - 99 mg/dL 161   BUN 8 - 23 mg/dL 25   Creatinine 0.96 - 1.00 mg/dL 0.45   Sodium 409 - 811 mmol/L 139   Potassium 3.5 - 5.1 mmol/L 4.3   Chloride 98 - 111 mmol/L 100   CO2 22 - 32 mmol/L 27   Calcium 8.9 - 10.3 mg/dL 9.7   Total Protein 6.5 - 8.1 g/dL 7.5   Total Bilirubin 0.3 - 1.2 mg/dL 0.5   Alkaline Phos 38 - 126 U/L 70   AST 15 - 41 U/L 25   ALT 0 - 44 U/L 22       Latest Ref Rng & Units 06/01/2022    3:21 PM  CBC  WBC 4.0 - 10.5 K/uL 8.0   Hemoglobin 12.0 - 15.0 g/dL 91.4   Hematocrit 78.2 - 46.0 % 42.4   Platelets 150 - 400 K/uL 313     No images are attached to the encounter.  NM Bone Scan Whole Body  Result Date: 06/22/2022 CLINICAL DATA:  Breast cancer EXAM: NUCLEAR MEDICINE WHOLE BODY BONE SCAN TECHNIQUE: Whole body anterior and posterior images were obtained approximately 3 hours after intravenous injection of radiopharmaceutical. RADIOPHARMACEUTICALS:  21.72 mCi Technetium-14m MDP IV COMPARISON:  Separate CT scan from same day of 06/19/2022 FINDINGS: Multifocal areas of degenerative type uptake including knees, feet and ankles as well cervical spine and right sternal manubrial joint. Also degenerative changes along the shoulders. There is physiologic uptake of the kidneys and bladder. There is also uptake along the head in the midline. This could represent prominent calcification along the falx or other process. Please correlate with any brain imaging such  as a head CT to confirm when appropriate. There is some curvature of the thoracic spine. IMPRESSION: Multifocal degenerative type  uptake. Focal uptake along the skull in the midline could represent uptake along the falx with calcification as one possibility although underlying lesion is difficult to completely exclude on these images. Please correlate with head CT or other brain imaging to confirm etiology when clinically appropriate. Electronically Signed   By: Karen Kays M.D.   On: 06/22/2022 13:53   CT CHEST ABDOMEN PELVIS W CONTRAST  Result Date: 06/22/2022 CLINICAL DATA:  Recurrent breast cancer.  * Tracking Code: BO * EXAM: CT CHEST, ABDOMEN, AND PELVIS WITH CONTRAST TECHNIQUE: Multidetector CT imaging of the chest, abdomen and pelvis was performed following the standard protocol during bolus administration of intravenous contrast. RADIATION DOSE REDUCTION: This exam was performed according to the departmental dose-optimization program which includes automated exposure control, adjustment of the mA and/or kV according to patient size and/or use of iterative reconstruction technique. CONTRAST:  OMNIPAQUE IOHEXOL 300 MG/ML  SOLN COMPARISON:  Noncontrast CT abdomen and pelvis 07/20/2004 FINDINGS: CT CHEST FINDINGS Cardiovascular: Heart is nonenlarged. Trace pericardial fluid. Coronary artery calcifications are seen. The thoracic aorta overall has a normal course and caliber with mild atherosclerotic calcified plaque. Mediastinum/Nodes: Small hiatal hernia. Otherwise normal caliber of the mildly patulous thoracic esophagus. The thyroid gland is atrophic. No specific abnormal lymph node enlargement identified along the axillary regions, chest wall, supraclavicular, internal mammary chains, hilar regions, and mediastinum. Lungs/Pleura: No consolidation, pneumothorax or effusion. There are tiny calcified nodules identified consistent with old granulomatous disease such as left lower lobe series 4, image 93, right upper lobe series 4, image 56 amongst others. There are some peripheral areas of scarring and fibrotic changes along the  anterior right hemithorax as well. There are some noncalcified nodule seen. Example right upper lobe on series 4, image 47 measures 4 mm. Adjacent foci even smaller on image 49. Musculoskeletal: Curvature and degenerative changes noted along the spine. CT ABDOMEN PELVIS FINDINGS Hepatobiliary: The liver has some tiny low-attenuation foci such as 4 mm on series 2, image 49 in segment 4/8, caudate lobe on series 2, image 54. These are too small to completely characterize. Gallbladder is nondilated. Patent portal vein. Pancreas: Unremarkable. No pancreatic ductal dilatation or surrounding inflammatory changes. Spleen: Normal in size without focal abnormality. Adrenals/Urinary Tract: The adrenal glands are preserved. No enhancing renal mass. There is an exophytic tiny fat containing lesion along the upper pole of the left kidney, unchanged from previous consistent with a small angiomyolipoma today measuring about 8 mm on series 2, image 60. The ureters have normal course and caliber extending down to the bladder. Preserved contours of the urinary bladder. Stomach/Bowel: Diffuse colonic stool. Few left-sided colonic diverticula. Normal appendix. There is a atypical distribution of the proximal transverse colon extending between the anterior liver margin in the anterior abdominal wall. The small bowel is nondilated. Stomach is nondilated. Vascular/Lymphatic: Scattered mild vascular calcifications. Normal caliber aorta and IVC. No specific abnormal lymph node enlargement identified in the abdomen and pelvis. Reproductive: Heterogeneous appearance of the uterus with some calcifications consistent with fibroids. There appears to be abnormal thickening of the endometrium. Please correlate with any symptoms and follow-up ultrasound when appropriate Other: No abdominal wall hernia or abnormality. No abdominopelvic ascites. Musculoskeletal: Scattered degenerative changes of the spine and pelvis. Please see separate dictation of  bone scan from same day. IMPRESSION: Surgical changes of right mastectomy. No developing mass lesion, fluid  collection or lymph node enlargement to suggest metastatic disease. There are some small lung nodules. Some of which are calcified while others are not but again are under 5 mm. No follow-up needed if patient is low-risk (and has no known or suspected primary neoplasm). Non-contrast chest CT can be considered in 12 months if patient is high-risk. This recommendation follows the consensus statement: Guidelines for Management of Incidental Pulmonary Nodules Detected on CT Images: From the Fleischner Society 2017; Radiology 2017; 284:228-243. Small hiatal hernia. Uterine fibroids. There is abnormal thickening to the endometrium of the uterus with the patient's age. Please correlate with any particular symptoms and recommend follow-up pelvic ultrasound when appropriate further delineate. Tiny low-attenuation liver lesions. Too small to completely characterize and likely benign cysts. Simple follow-up as clinically appropriate. Colonic diverticula. 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 Please correlate with separate whole-body bone scan. Electronically Signed   By: Karen Kays M.D.   On: 06/22/2022 13:49     Assessment and plan- Patient is a 82 y.o. female with post mastectomy local recurrence of ER/PR positive HER2 negative breast cancer here to discuss further management  Patient had a local postmastectomy recurrence for which she underwent definitive excision by Dr. Dellis Anes at [facility].  Final pathology showed 16 mm grade 2 ER/PR positive HER2 negative tumor with negative margins.  She had systemic scans as well which did not show any evidence of distant recurrence.  She has declined postmastectomy radiation.  She is not interested in Oncotype testing to decide if she would benefit from adjuvant chemotherapy given her age.  Patient has been having  difficulty tolerating letrozole over the last 2 months with symptoms of constipation and head heaviness.  I have asked her to stop taking letrozole for the next 3 to 4 weeks and call us about how she is feeling.  If symptoms have improved and patient is willing to try an alternative drug we can switch her to exemestane.  Both letrozole and exemestane her equally effective as preventative endocrine therapy.  Side effects could be similar but sometimes 1 agent may be better tolerated as compared to the other. Switching to tamoxifen is also an option.  Patient has taken tamoxifen in the past and did not have any significant side effects.  Discussed risks and benefits of tamoxifen including all but not limited to cataracts, hot flashes, mood swings, risk of uterine cancer and DVT. Stopping endocrine therapy altogether  Patient will call us and let us know which way she would like to proceed.  I will see her back in 4 months no labs   Visit Diagnosis 1. Recurrent breast cancer, right   2. Goals of care, counseling/discussion      Dr. Owens Shark, MD, MPH [facility] [phone] 07/17/2022 12:29 PM

## 2022-08-05 ENCOUNTER — Inpatient Hospital Stay: Payer: Medicare Other | Attending: Oncology | Admitting: Hospice and Palliative Medicine

## 2022-08-05 DIAGNOSIS — C50911 Malignant neoplasm of unspecified site of right female breast: Secondary | ICD-10-CM

## 2022-08-05 NOTE — Progress Notes (Signed)
Multidisciplinary Oncology Council Documentation  LURENE ROBLEY was presented by our [facility] on 08/05/2022, which included representatives from:  Palliative Care Dietitian  Physical/Occupational Therapist Nurse Navigator Genetics Speech Therapist Social work Survivorship RN Financial Navigator Research RN   Wiley currently presents with history of breast cancer  We reviewed previous medical and familial history, history of present illness, and recent lab results along with all available histopathologic and imaging studies. The MOC considered available treatment options and made the following recommendations/referrals:  Nutrition  The MOC is a meeting of clinicians from various specialty areas who evaluate and discuss patients for whom a multidisciplinary approach is being considered. Final determinations in the plan of care are those of the provider(s).   Today's extended care, comprehensive team conference, Tomesha was not present for the discussion and was not examined.

## 2022-11-16 ENCOUNTER — Inpatient Hospital Stay: Payer: Medicare Other | Attending: Oncology | Admitting: Oncology

## 2022-11-16 ENCOUNTER — Encounter: Payer: Self-pay | Admitting: Oncology

## 2022-11-16 VITALS — BP 169/67 | HR 67 | Temp 97.6°F | Resp 18 | Ht 67.0 in | Wt 152.5 lb

## 2022-11-16 DIAGNOSIS — Z1231 Encounter for screening mammogram for malignant neoplasm of breast: Secondary | ICD-10-CM

## 2022-11-16 DIAGNOSIS — Z170 Estrogen receptor positive status [ER+]: Secondary | ICD-10-CM | POA: Diagnosis not present

## 2022-11-16 DIAGNOSIS — C50911 Malignant neoplasm of unspecified site of right female breast: Secondary | ICD-10-CM

## 2022-11-16 DIAGNOSIS — C50811 Malignant neoplasm of overlapping sites of right female breast: Secondary | ICD-10-CM | POA: Diagnosis present

## 2022-11-16 DIAGNOSIS — Z9011 Acquired absence of right breast and nipple: Secondary | ICD-10-CM | POA: Insufficient documentation

## 2022-11-22 NOTE — Progress Notes (Signed)
Hematology/Oncology Consult note [facility]  [phone] [phone]  Patient Care Team: Myrene Buddy, NP as PCP - General (Internal Medicine) Nadeen Landau, MD (Inactive) as Referring Physician (Surgery) Hulen Luster, RN as Oncology Nurse Navigator Creig Hines, MD as Consulting Physician (Oncology)   Name of the patient: Sydney Parks  [id number]  1941/02/02   Date of visit: 11/22/22  Diagnosis- h/o recurrent breast cancer- chest wall recurrence post surgery  Chief complaint/ Reason for visit- routine f/u of breast cancer  Heme/Onc history: patient is a 82 year old female who 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  She had lumpectomy, postlumpectomy radiation and 5 years of tamoxifen back then.  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  Tumor was T1b N0 M0.  She underwent mastectomy and was started on letrozole by Dr. Doylene Canning.  She did not require postmastectomy radiation or chemotherapy.  It appears that patient did not complete 5 years of letrozole and was lost to follow-up.  Patient self palpated a mass along her mastectomy scar which led toLeft breast mammogram as well as right chest wall ultrasound.  Mammogram showed 1.3 cm medial right breast/chest mass which contacts and appears to invade underlying pectoralis muscle.  No abnormal appearing right axillary lymph nodes.  No mammographic appearance of left breast malignancy.   Systemic scans were negative for metastatic disease.  Patient was seen by Dr. Dellis Anes at [facility] and underwent definitive excision of the postmastectomy recurrence.  Final pathSurgery showed 1.6 cm grade 2 invasive mammary carcinoma with negative margins.  ER 100% positive, PR 80% positive and HER2 negative.  Patient was also seen by radiation oncology at [facility] and declined adjuvant radiation. She started letrozole in April 2024  but stopped subsequently due to intolerance  Interval history- Patient is not taking her letrozole presently. She is unable to tolerate it and does not wish to try alternate AI  ECOG PS- 1 Pain scale- 0   Review of systems- Review of Systems  Constitutional:  Negative for chills, fever, malaise/fatigue and weight loss.  HENT:  Negative for congestion, ear discharge and nosebleeds.   Eyes:  Negative for blurred vision.  Respiratory:  Negative for cough, hemoptysis, sputum production, shortness of breath and wheezing.   Cardiovascular:  Negative for chest pain, palpitations, orthopnea and claudication.  Gastrointestinal:  Negative for abdominal pain, blood in stool, constipation, diarrhea, heartburn, melena, nausea and vomiting.  Genitourinary:  Negative for dysuria, flank pain, frequency, hematuria and urgency.  Musculoskeletal:  Negative for back pain, joint pain and myalgias.  Skin:  Negative for rash.  Neurological:  Negative for dizziness, tingling, focal weakness, seizures, weakness and headaches.  Endo/Heme/Allergies:  Does not bruise/bleed easily.  Psychiatric/Behavioral:  Negative for depression and suicidal ideas. The patient does not have insomnia.       Allergies  Allergen Reactions   Alendronate Diarrhea    Other reaction(s): Dizziness, Other (See Comments) Joint pain   Nadolol     Other reaction(s): Dizziness Per patient, "Felt like the back of head was going to explode"   Statins     Other reaction(s): Muscle Pain Mevacor-myalgias.  Crestor-"Just didn't feel right."     Past Medical History:  Diagnosis Date   Arthritis    Breast cancer (HCC) 2000 and 2016   right breast ca twice.  Mastectomy in 2016   Cataract    Diabetes  mellitus without complication (HCC)    GERD (gastroesophageal reflux disease)    Hx of migraines    from ages [age]   Hypercholesteremia    Hypertension    Kidney stone on right side    Personal history of radiation therapy 2000    right breast ca   Thyroid disease    Varicose vein of leg      Past Surgical History:  Procedure Laterality Date   BREAST BIOPSY Right 2000   breast ca   BREAST BIOPSY Right 2016   invasive mammary-mastectomy   BREAST BIOPSY Right 05/27/2022   [place] RT BREAST BX W LOC DEV 1ST LESION IMG BX SPEC US GUIDE 05/27/2022 [facility]   BREAST LUMPECTOMY Right 2000   breast ca with rad tx and tamoxifen   CATARACT EXTRACTION W/ INTRAOCULAR LENS  IMPLANT, BILATERAL     MASTECTOMY Right 2016   + complete mastectomy and letrozole tx   SIMPLE MASTECTOMY WITH AXILLARY SENTINEL NODE BIOPSY Right 08/21/2014   Procedure: SIMPLE MASTECTOMY WITH AXILLARY SENTINEL NODE BIOPSY;  Surgeon: Renda Rolls, MD;  Location: [facility] ORS;  Service: General;  Laterality: Right;   TONSILLECTOMY     VARICOSE VEIN SURGERY Left    laser surgery    Social History   Socioeconomic History   Marital status: Married    Spouse name: Not on file   Number of children: Not on file   Years of education: Not on file   Highest education level: Not on file  Occupational History   Not on file  Tobacco Use   Smoking status: Former    Current packs/day: 0.00    Average packs/day: 0.5 packs/day for 10.0 years (5.0 ttl pk-yrs)    Types: Cigarettes    Start date: 08/04/1960    Quit date: 08/05/1970    Years since quitting: 52.3   Smokeless tobacco: Never  Vaping Use   Vaping status: Never Used  Substance and Sexual Activity   Alcohol use: No   Drug use: No   Sexual activity: Yes    Birth control/protection: Post-menopausal  Other Topics Concern   Not on file  Social History Narrative   Not on file   Social Determinants of Health   Financial Resource Strain: Low Risk  (06/02/2022)   Received [facility], [facility]   Overall Financial Resource Strain (CARDIA)    Difficulty of Paying Living Expenses: Not hard at all  Food Insecurity: No Food Insecurity (06/02/2022)   Received from [facility], [facility]   Hunger Vital Sign    Worried About Running Out of Food in the Last Year: Never true    Ran Out of Food in the Last Year: Never true  Transportation Needs: No Transportation Needs (06/02/2022)   Received from [facility], [facility]   PRAPARE - Transportation    Lack of Transportation (Medical): No    Lack of Transportation (Non-Medical): No  Physical Activity: Not on file  Stress: Not on file  Social Connections: Not on file  Intimate Partner Violence: Not At Risk (06/01/2022)   Humiliation, Afraid, Rape, and Kick questionnaire    Fear of Current or Ex-Partner: No    Emotionally Abused: No    Physically Abused: No    Sexually Abused: No    Family History  Problem Relation Age of Onset   Osteoarthritis Mother    Heart disease Father    Dementia Sister    Breast cancer Daughter  41     Current Outpatient Medications:    acetaminophen (TYLENOL) 500 MG tablet, Take 500 mg by mouth every 6 (six) hours as needed for mild pain., Disp: , Rfl:    allopurinol (ZYLOPRIM) 300 MG tablet, Take 300 mg by mouth daily., Disp: , Rfl:    furosemide (LASIX) 20 MG tablet, Take by mouth., Disp: , Rfl:    levothyroxine (SYNTHROID, LEVOTHROID) 100 MCG tablet, Take 100 mcg by mouth daily before breakfast., Disp: , Rfl:    lisinopril (ZESTRIL) 20 MG tablet, Take by mouth., Disp: , Rfl:    metFORMIN (GLUCOPHAGE) 500 MG tablet, Take 500 mg by mouth every morning. , Disp: , Rfl:    colchicine 0.6 MG tablet, Take 0.6 mg by mouth as needed. (Patient not taking: Reported on 11/16/2022), Disp: , Rfl:    letrozole (FEMARA) 2.5 MG tablet, Take 1 tablet (2.5 mg total) by mouth daily. (Patient not taking: Reported on 11/16/2022), Disp: 30 tablet, Rfl: 3  Physical exam:  Vitals:   11/16/22 1026 11/16/22 1032  BP: (!) 180/64 (!) 169/67  Pulse: 74 67  Resp: 18   Temp: 97.6 F (36.4 C)   TempSrc: Tympanic   SpO2: 98%   Weight: 152 lb 8 oz (69.2 kg)   Height: 5\' 7"  (1.702 m)    Physical  Exam Eyes:     Pupils: Pupils are equal, round, and reactive to light.  Cardiovascular:     Rate and Rhythm: Normal rate and regular rhythm.     Heart sounds: Normal heart sounds.  Pulmonary:     Effort: Pulmonary effort is normal.     Breath sounds: Normal breath sounds.  Abdominal:     General: Bowel sounds are normal.     Palpations: Abdomen is soft.  Musculoskeletal:     Cervical back: Normal range of motion.  Skin:    General: Skin is warm and dry.  Neurological:     Mental Status: She is alert and oriented to person, place, and time.   Breast exam: Patient is s/p right mastectomy without reconstruction. No evidence of chest wall recurrence. No palpable masses in left breast. No palpable b/ axillary adenopathy      Latest Ref Rng & Units 06/01/2022    3:21 PM  CMP  Glucose 70 - 99 mg/dL 161   BUN 8 - 23 mg/dL 25   Creatinine 0.96 - 1.00 mg/dL 0.45   Sodium 409 - 811 mmol/L 139   Potassium 3.5 - 5.1 mmol/L 4.3   Chloride 98 - 111 mmol/L 100   CO2 22 - 32 mmol/L 27   Calcium 8.9 - 10.3 mg/dL 9.7   Total Protein 6.5 - 8.1 g/dL 7.5   Total Bilirubin 0.3 - 1.2 mg/dL 0.5   Alkaline Phos 38 - 126 U/L 70   AST 15 - 41 U/L 25   ALT 0 - 44 U/L 22       Latest Ref Rng & Units 06/01/2022    3:21 PM  CBC  WBC 4.0 - 10.5 K/uL 8.0   Hemoglobin 12.0 - 15.0 g/dL 91.4   Hematocrit 78.2 - 46.0 % 42.4   Platelets 150 - 400 K/uL 313     No images are attached to the encounter.  No results found.   Assessment and plan- Patient is a 82 y.o. female with post mastectomy local recurrence of ER/PR positive HER2 negative breast cancer. She is here for routine f/u  Patient did not tolerate letrozole well and  does not wish to try alternate AI or tamoxifen. She understands that it is possible she may have distant recurrence of her ER + breast cancer without endocrine therapy.  I will see her back in 6 months with tumor marker check. Mammogram in feb 2025   Visit Diagnosis 1.  Recurrent breast cancer, right (HCC)   2. Encounter for screening mammogram for malignant neoplasm of breast      Dr. Owens Shark, MD, MPH [facility] [phone] 11/22/2022 3:24 PM

## 2022-12-03 ENCOUNTER — Other Ambulatory Visit: Payer: Self-pay | Admitting: Orthopedic Surgery

## 2022-12-03 DIAGNOSIS — M5412 Radiculopathy, cervical region: Secondary | ICD-10-CM

## 2022-12-03 DIAGNOSIS — M4802 Spinal stenosis, cervical region: Secondary | ICD-10-CM

## 2022-12-03 DIAGNOSIS — M5030 Other cervical disc degeneration, unspecified cervical region: Secondary | ICD-10-CM

## 2022-12-19 ENCOUNTER — Ambulatory Visit
Admission: RE | Admit: 2022-12-19 | Discharge: 2022-12-19 | Disposition: A | Discharge status: Home / Self Care | Payer: Medicare Other | Source: Ambulatory Visit | Attending: Orthopedic Surgery | Admitting: Orthopedic Surgery

## 2022-12-19 DIAGNOSIS — M4802 Spinal stenosis, cervical region: Secondary | ICD-10-CM

## 2022-12-19 DIAGNOSIS — M5412 Radiculopathy, cervical region: Secondary | ICD-10-CM

## 2022-12-19 DIAGNOSIS — M5030 Other cervical disc degeneration, unspecified cervical region: Secondary | ICD-10-CM

## 2023-03-08 ENCOUNTER — Ambulatory Visit: Payer: Medicare Other | Attending: Physical Medicine & Rehabilitation | Admitting: Physical Therapy

## 2023-03-08 DIAGNOSIS — M79621 Pain in right upper arm: Secondary | ICD-10-CM | POA: Diagnosis present

## 2023-03-08 DIAGNOSIS — M542 Cervicalgia: Secondary | ICD-10-CM | POA: Insufficient documentation

## 2023-03-08 DIAGNOSIS — R2681 Unsteadiness on feet: Secondary | ICD-10-CM | POA: Insufficient documentation

## 2023-03-08 DIAGNOSIS — M6281 Muscle weakness (generalized): Secondary | ICD-10-CM | POA: Insufficient documentation

## 2023-03-08 NOTE — Therapy (Unsigned)
OUTPATIENT PHYSICAL THERAPY NECK EVALUATION   Patient Name: Sydney Parks MRN: [id number] DOB:03-12-1941, 82 y.o., female Today's Date: 03/08/2023  END OF SESSION:  PT End of Session - 03/08/23 1226     Visit Number 1    Number of Visits 13    Date for PT Re-Evaluation 04/19/23    Authorization Type Medicare    Authorization Time Period Initial eval 03/08/23    PT Start Time 1244    PT Stop Time 1326    PT Time Calculation (min) 42 min    Activity Tolerance Patient tolerated treatment well    Behavior During Therapy [facility] for tasks assessed/performed             Past Medical History:  Diagnosis Date   Arthritis    Breast cancer (HCC) 2000 and 2016   right breast ca twice.  Mastectomy in 2016   Cataract    Diabetes mellitus without complication (HCC)    GERD (gastroesophageal reflux disease)    Hx of migraines    from ages [age]   Hypercholesteremia    Hypertension    Kidney stone on right side    Personal history of radiation therapy 2000   right breast ca   Thyroid disease    Varicose vein of leg    Past Surgical History:  Procedure Laterality Date   BREAST BIOPSY Right 2000   breast ca   BREAST BIOPSY Right 2016   invasive mammary-mastectomy   BREAST BIOPSY Right 05/27/2022   [place] RT BREAST BX W LOC DEV 1ST LESION IMG BX SPEC US GUIDE 05/27/2022 [facility]   BREAST LUMPECTOMY Right 2000   breast ca with rad tx and tamoxifen   CATARACT EXTRACTION W/ INTRAOCULAR LENS  IMPLANT, BILATERAL     MASTECTOMY Right 2016   + complete mastectomy and letrozole tx   SIMPLE MASTECTOMY WITH AXILLARY SENTINEL NODE BIOPSY Right 08/21/2014   Procedure: SIMPLE MASTECTOMY WITH AXILLARY SENTINEL NODE BIOPSY;  Surgeon: Renda Rolls, MD;  Location: [facility] ORS;  Service: General;  Laterality: Right;   TONSILLECTOMY     VARICOSE VEIN SURGERY Left    laser surgery   Patient Active Problem List   Diagnosis Date Noted   Recurrent breast cancer, right (HCC) 06/01/2022   Chronic  venous insufficiency 09/17/2021   Lymphedema 09/17/2021   Aortic insufficiency 10/05/2018   PVC's (premature ventricular contractions) 09/16/2018   History of right breast cancer 07/19/2018   Gout 07/05/2017   Malignant neoplasm of breast (HCC) 03/06/2015   Type 2 diabetes mellitus (HCC) 03/06/2015   Hypercholesterolemia 03/06/2015   S/P mastectomy 08/21/2014   Breast cancer (HCC) 08/21/2014   Arthritis 08/08/2014   Diabetes mellitus, type 2 (HCC) 08/08/2014   Hypercholesteremia 08/08/2014   Hypertension 08/08/2014   Hypothyroidism 08/08/2014   Osteopenia 04/06/2001    PCP: Myrene Buddy, NP  REFERRING PROVIDER: Elijah Birk, MD  REFERRING DIAG:  M54.2 (ICD-10-CM) - Cervicalgia  M54.12 (ICD-10-CM) - Radiculopathy, cervical region    RATIONALE FOR EVALUATION AND TREATMENT: Rehabilitation  THERAPY DIAG: Cervicalgia  Pain in right upper arm  Muscle weakness (generalized)  ONSET DATE: Since surgical excision of bone along sternum/R rib cage - 2 years ago  FOLLOW-UP APPT SCHEDULED WITH REFERRING PROVIDER: Yes ; pt to f/u with Dr. Mariah Milling after completion of PT   SUBJECTIVE:  Chief Complaint: Patient is an 82 year old female referred for neck pain/cervical radiculopathy with R shoulder pain referred by Filomena Jungling, MD.   Pertinent History Patient is an 82 year old female referred for neck pain/cervical radiculopathy with R shoulder pain referred by Filomena Jungling, MD.  Pt has husband with Parkinson's disease for whom she is primary caregiver/support person. Hx of R shoulder Fx/greater tuberosity Fx with no surgery. Pain into upper back and R shoulder worsened following breast cancer surgery. No numbness/tingling. Per referring provider, symptoms are worse with  lifting/cooking/cleaning. Symptoms are better with sitting up straight. Patient reports pain affecting R upper trap and R upper arm. Hx of breast cancer on her R side; Hx of scar tissue along region of R mastectomy Pt believes that how she was laid on the table during the surgery may have affected her. Pt reports pain affecting R upper trapezius and down to R periscapular region. Pt states it is not severe; she did not take Gabapentin - she usually takes it morning and evening.Pt denies not feel she has posterior cervical pain. Pt reports headaches at night 2-3x/week or more.   Pain:  Pain Intensity: Present: 4/10, Best: 4/10, Worst: 8/10 Pain location: R upper trapezius, R periscapular region Pain Quality: aching , "sore" Radiating: Yes , to R upper arm Numbness/Tingling: No Focal Weakness: No Aggravating factors: moving arms around at high volume Relieving factors: sitting up straight, reclining back into couch, Gabapentin,  24-hour pain behavior: None History of prior neck injury, pain, surgery, or therapy: Yes; Hx of R mastectomy and removal of some osseous tissue along sternum/rib cage; hx of R proximal humerus/greater tuberosity Fx Dominant hand: right  Imaging: Yes ;  MRI cervical spine 12/19/22 1. 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 2. Mild spinal canal stenosis at C4-C5 and C5-C6.  Electronically Signed   By: Lesia Hausen M.D.   On: 12/23/2022 08:55    Red flags (personal history of cancer, h/o spinal tumors, history of compression fracture, chills/fever, night sweats, nausea, vomiting, unrelenting pain): Positive for history of breast cancer only   PRECAUTIONS: None  WEIGHT BEARING RESTRICTIONS: No  FALLS: Has patient fallen in last 6 months? No  Living Environment Lives  with: lives with their spouse; pt lives with son and his wife Lives in: House/apartment   Prior level of function: Independent  Occupational demands: Retired  Hobbies: Cooking  Patient Goals: Improved pain in R shoulder    OBJECTIVE:   Patient Surveys  FOTO , predicted improvement to ##  Cognition Patient is oriented to person, place, and time.  Recent memory is intact.  Remote memory is intact.  Attention span and concentration are intact.  Expressive speech is intact.  Patient's fund of knowledge is within normal limits for educational level.    Gross Musculoskeletal Assessment Tremor: None Bulk: Normal Tone: Normal   Posture Increased thoracic kyphosis, forward head, pt rests in cervical protraction   AROM AROM (Normal range in degrees) AROM  Cervical  Flexion (50) 50  Extension (80) 35  Right lateral flexion (45) 22  Left lateral flexion (45) 30  Right rotation (85) 52  Left rotation (85) 47  (* = pain; Blank rows = not tested)   Shoulder AROM: Flexion 140 bilat, Abduction 160 bilat (pain with elevation of R shoulder)    MMT MMT (out  of 5) Right Left      Shoulder   Flexion 4-* 4  Extension    Abduction 4* 4+* (pain on R paracervical region)  Internal rotation 4+ 4+  Horizontal abduction    Horizontal adduction    Lower Trapezius    Rhomboids        Elbow  Flexion 4+* 4+  Extension 4+ 4+  Pronation    Supination        Wrist  Flexion 4* 4+  Extension 4+ 4  Radial deviation    Ulnar deviation        (* = pain; Blank rows = not tested)  Sensation Deferred; no sensory deficits in hands/digits   Reflexes R/L Elbow: 2+/2+  Brachioradialis: 2+/2+  Tricep: 2+/2+ Clonus: R Negative, L Negative   Palpation Location LEFT  RIGHT           Suboccipitals    Cervical paraspinals    Upper Trapezius    Levator Scapulae    Rhomboid Major/Minor    (Blank rows = not tested) Graded on 0-4 scale (0 = no pain, 1 = pain, 2 = pain with  wincing/grimacing/flinching, 3 = pain with withdrawal, 4 = unwilling to allow palpation), (Blank rows = not tested)  Repeated Movements No centralization or peripheralization of symptoms with repeated cervical protraction and retraction.   Passive Accessory Intervertebral Motion Pt denies reproduction of neck pain with CPA C2-T3 and UPA bilaterally C2-T3. Generally, hypomobile throughout   SPECIAL TESTS Spurlings A (ipsilateral lateral flexion/axial compression): R: Negative L: Negative Distraction Test: Negative  Hoffman Sign (cervical cord compression): R: Negative L: Negative ULTT Median: R: Not examined L: Not examined ULTT Ulnar: R: Not examined L: Not examined ULTT Radial: R: Not examined L: Not examined  Clinical Prediction Rules  Diagnostic Cervical Radiculopathy ULTTa: Any one of the following: A) symptom reproduction; B) side-to-side difference >10 degrees in elbow extension; or C) with regard to involved/painful side: ipsilateral neck lateral flexion decreases symptoms and/or contralateral neck lateral flexion increases symptoms.  ROM: involved-side cervical rotation range of motion less than 60 degrees Distraction test: symptom reduction.  Spurling's A: symptom reproduction.   Number of Positive Criteria  Sensitivity  Specificity  Pos LR  Neg LR   Two  0.39 0.56 0.88 1.09   Three  0.39  0.94  6.1 0.65   Four  0.24 0.99  30.3 0.77   Pos LR = positive likelihood ratio. Neg LR = negative likelihood ratio.    Interventional  Cervical Manipulation 1. Symptom duration of less than 38 days 2. Positive expectation that manipulation will help 3. Side-to-side difference in cervical rotation ROM of 10 or greater 4. Pain with posteroanterior spring testing of the middle cervical spine If at least 3 attributes were present (+LR, 13.5), the probability of experiencing a successful outcome is 90%. IMPLICATIONS: The findi  Cervical Traction patient reported periperalization  with lower cervical spine (C4-7) mobility testing,  positive shoulder abduction test,  age > 55,  positive upper limb tension test A,  positive neck distraction test  Number of Positive Criteria  Sensitivity  Specificity  Pos LR  Neg LR  Probability of Success w/ traction + exercise  One 0.07 0.97 1.15 0.21 47.6%  Two  0.3 0.97 1.44 0.40 53.2%  Three 0.63 0.87 4.81 0.42 79.2%  Four  0.3 1.0 23.1 0.71 94.8%  Pos LR = positive likelihood ratio. Neg LR = negative likelihood ratio.  Having at least 3 out of 5  predictors appears to be the optimal threshold for choosing the cervical traction as an intervention.    TODAY'S TREATMENT    Therapeutic Exercise - for HEP establishment, discussion on appropriate exercise/activity modification, PT education   Reviewed baseline home exercises and provided handout for [employer] program (see Access Code); tactile cueing and therapist demonstration utilized as needed for carryover of proper technique to HEP.    Patient education on current condition, anatomy involved, prognosis, plan of care. Discussion on activity modification to prevent flare-up of condition, including .     PATIENT EDUCATION:  Education details: see above for patient education details Person educated: Patient Education method: Explanation, Demonstration, and Handouts Education comprehension: verbalized understanding and returned demonstration   HOME EXERCISE PROGRAM:  Access Code: [email or web address] URL: [email or web address] Date: 03/08/2023 Prepared by: Consuela Mimes  Exercises - Supine Chin Tuck  - 3 x daily - 7 x weekly - 2 sets - 10 reps - 1sec hold - Seated Upper Trapezius Stretch  - 2 x daily - 7 x weekly - 3 sets - 30sec hold - Seated Scapular Retraction  - 2 x daily - 7 x weekly - 2 sets - 10 reps - 5sec hold  ASSESSMENT:  CLINICAL IMPRESSION: Patient is a 82 y.o. female who was seen today for physical therapy evaluation and treatment for neck pain  with R-sided referral. Negative Spurling's and distraction test today. No numbness/tingling or sensory changes. .   OBJECTIVE IMPAIRMENTS: {opptimpairments:25111}.   ACTIVITY LIMITATIONS: {activitylimitations:27494}  PARTICIPATION LIMITATIONS: {participationrestrictions:25113}  PERSONAL FACTORS: {Personal factors:25162} are also affecting patient's functional outcome.   REHAB POTENTIAL: {rehabpotential:25112}  CLINICAL DECISION MAKING: {clinical decision making:25114}  EVALUATION COMPLEXITY: {Evaluation complexity:25115}   GOALS: Goals reviewed with patient? {yes/no:20286}  SHORT TERM GOALS: Target date: {follow up:25551}  Pt will be independent with HEP to improve strength and decrease neck pain to improve pain-free function at home and work. Baseline: *** Goal status: INITIAL   LONG TERM GOALS: Target date: {follow up:25551}  Pt will increase FOTO to at least *** to demonstrate significant improvement in function at home and work related to neck pain  Baseline:  Goal status: INITIAL  2.  Pt will decrease worst neck pain by at least 2 points on the NPRS in order to demonstrate clinically significant reduction in neck pain. Baseline: *** Goal status: INITIAL  3.  Pt will decrease NDI score by at least 19% in order demonstrate clinically significant reduction in neck pain/disability.       Baseline: *** Goal status: INITIAL  4.  *** Baseline: *** Goal status: INITIAL   PLAN: PT FREQUENCY: 1-2x/week  PT DURATION: {rehab duration:25117}  PLANNED INTERVENTIONS: Therapeutic exercises, Therapeutic activity, Neuromuscular re-education, Balance training, Gait training, Patient/Family education, Self Care, Joint mobilization, Joint manipulation, Vestibular training, Canalith repositioning, Orthotic/Fit training, DME instructions, Dry Needling, Electrical stimulation, Spinal manipulation, Spinal mobilization, Cryotherapy, Moist heat, Taping, Traction, Ultrasound,  Ionotophoresis 4mg /ml Dexamethasone, Manual therapy, and Re-evaluation.  PLAN FOR NEXT SESSION: ***   Lynnea Maizes PT, DPT, GCS  Gertie Exon, PT 03/08/2023, 12:47 PM

## 2023-03-09 ENCOUNTER — Encounter: Payer: Self-pay | Admitting: Physical Therapy

## 2023-03-10 ENCOUNTER — Ambulatory Visit: Payer: Medicare Other | Admitting: Physical Therapy

## 2023-03-15 ENCOUNTER — Ambulatory Visit: Payer: Medicare Other | Admitting: Physical Therapy

## 2023-03-15 DIAGNOSIS — M542 Cervicalgia: Secondary | ICD-10-CM

## 2023-03-15 DIAGNOSIS — M6281 Muscle weakness (generalized): Secondary | ICD-10-CM

## 2023-03-15 DIAGNOSIS — M79621 Pain in right upper arm: Secondary | ICD-10-CM

## 2023-03-15 NOTE — Therapy (Unsigned)
OUTPATIENT PHYSICAL THERAPY TREATMENT   Patient Name: Sydney Parks MRN: [id number] DOB:[birth year], 82 y.o., female Today's Date: 03/15/2023  END OF SESSION:  PT End of Session - 03/15/23 1248     Visit Number 2    Number of Visits 13    Date for PT Re-Evaluation 04/19/23    Authorization Type Medicare    Authorization Time Period Initial eval 03/08/23    PT Start Time 1248    PT Stop Time 1328    PT Time Calculation (min) 40 min    Activity Tolerance Patient tolerated treatment well    Behavior During Therapy [facility] for tasks assessed/performed              Past Medical History:  Diagnosis Date   Arthritis    Breast cancer (HCC) 2000 and 2016   right breast ca twice.  Mastectomy in 2016   Cataract    Diabetes mellitus without complication (HCC)    GERD (gastroesophageal reflux disease)    Hx of migraines    from ages [age]   Hypercholesteremia    Hypertension    Kidney stone on right side    Personal history of radiation therapy 2000   right breast ca   Thyroid disease    Varicose vein of leg    Past Surgical History:  Procedure Laterality Date   BREAST BIOPSY Right 2000   breast ca   BREAST BIOPSY Right 2016   invasive mammary-mastectomy   BREAST BIOPSY Right 05/27/2022   [place] RT BREAST BX W LOC DEV 1ST LESION IMG BX SPEC US GUIDE 05/27/2022 [facility]   BREAST LUMPECTOMY Right 2000   breast ca with rad tx and tamoxifen   CATARACT EXTRACTION W/ INTRAOCULAR LENS  IMPLANT, BILATERAL     MASTECTOMY Right 2016   + complete mastectomy and letrozole tx   SIMPLE MASTECTOMY WITH AXILLARY SENTINEL NODE BIOPSY Right 08/21/2014   Procedure: SIMPLE MASTECTOMY WITH AXILLARY SENTINEL NODE BIOPSY;  Surgeon: Renda Rolls, MD;  Location: [facility] ORS;  Service: General;  Laterality: Right;   TONSILLECTOMY     VARICOSE VEIN SURGERY Left    laser surgery   Patient Active Problem List   Diagnosis Date Noted   Recurrent breast cancer, right (HCC) 06/01/2022   Chronic venous  insufficiency 09/17/2021   Lymphedema 09/17/2021   Aortic insufficiency 10/05/2018   PVC's (premature ventricular contractions) 09/16/2018   History of right breast cancer 07/19/2018   Gout 07/05/2017   Malignant neoplasm of breast (HCC) 03/06/2015   Type 2 diabetes mellitus (HCC) 03/06/2015   Hypercholesterolemia 03/06/2015   S/P mastectomy 08/21/2014   Breast cancer (HCC) 08/21/2014   Arthritis 08/08/2014   Diabetes mellitus, type 2 (HCC) 08/08/2014   Hypercholesteremia 08/08/2014   Hypertension 08/08/2014   Hypothyroidism 08/08/2014   Osteopenia 04/06/2001    PCP: Myrene Buddy, NP  REFERRING PROVIDER: Elijah Birk, MD  REFERRING DIAG:  M54.2 (ICD-10-CM) - Cervicalgia  M54.12 (ICD-10-CM) - Radiculopathy, cervical region    RATIONALE FOR EVALUATION AND TREATMENT: Rehabilitation  THERAPY DIAG: Cervicalgia  Pain in right upper arm  Muscle weakness (generalized)  ONSET DATE: Since surgical excision of bone along sternum/R rib cage - 2 years ago  FOLLOW-UP APPT SCHEDULED WITH REFERRING PROVIDER: Yes ; pt to f/u with Dr. Mariah Milling after completion of PT  PERTINENT HISTORY: Patient is an 82 year old female referred for neck pain/cervical radiculopathy with R shoulder pain referred by Filomena Jungling, MD.  Pt has husband with Parkinson's disease for whom  she is primary caregiver/support person. Hx of R shoulder Fx/greater tuberosity Fx with no surgery. Pain into upper back and R shoulder worsened following breast cancer surgery. No numbness/tingling. Per referring provider, symptoms are worse with lifting/cooking/cleaning. Symptoms are better with sitting up straight. Patient reports pain affecting R upper trap and R upper arm. Hx of breast cancer on her R side; Hx of scar tissue along region of R mastectomy Pt believes that how she was laid on the table during the surgery may have affected her. Pt reports pain affecting R upper trapezius and down to R periscapular  region. Pt states it is not severe; she did not take Gabapentin - she usually takes it morning and evening. Pt denies not feel she has posterior cervical pain. Pt reports headaches at night 2-3x/week or more.   Pain:  Pain Intensity: Present: 4/10, Best: 4/10, Worst: 8/10 Pain location: R upper trapezius, R periscapular region Pain Quality: aching , "sore" Radiating: Yes , to R upper arm Numbness/Tingling: No Focal Weakness: No Aggravating factors: moving arms around at high volume Relieving factors: sitting up straight, reclining back into couch, Gabapentin 24-hour pain behavior: None History of prior neck injury, pain, surgery, or therapy: Yes; Hx of R mastectomy and removal of some osseous tissue along sternum/rib cage; hx of R proximal humerus/greater tuberosity Fx Dominant hand: right  Imaging: Yes ;  MRI cervical spine 12/19/22 1. 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 2. Mild spinal canal stenosis at C4-C5 and C5-C6.  Electronically Signed   By: Lesia Hausen M.D.   On: 12/23/2022 08:55   Red flags (personal history of cancer, h/o spinal tumors, history of compression fracture, chills/fever, night sweats, nausea, vomiting, unrelenting pain): Positive for history of breast cancer only, s/p R mastectomy    PRECAUTIONS: None  WEIGHT BEARING RESTRICTIONS: No  FALLS: Has patient fallen in last 6 months? No  Living Environment Lives with: lives with their spouse; pt lives with son and his wife Lives in: House/apartment  Prior level of function: Independent  Occupational demands: Retired  Hobbies: Cooking  Patient Goals: Improved pain in R shoulder    OBJECTIVE:    Posture Increased thoracic kyphosis, forward head, pt rests in cervical protraction   AROM AROM (Normal  range in degrees) AROM 03/08/23  Cervical  Flexion (50) 50  Extension (80) 35  Right lateral flexion (45) 22  Left lateral flexion (45) 30  Right rotation (85) 52  Left rotation (85) 47  (* = pain; Blank rows = not tested)   Shoulder AROM: Flexion 140 bilat, Abduction 160 bilat (pain with elevation of R shoulder)   MMT MMT (out of 5) Right 03/08/23 Left 03/08/23      Shoulder   Flexion 4-* 4  Extension    Abduction 4* 4+* (pain on R paracervical region)  Internal rotation 4+ 4+  Horizontal abduction    Horizontal adduction    Lower Trapezius    Rhomboids        Elbow  Flexion 4+* 4+  Extension 4+ 4+  Pronation    Supination        Wrist  Flexion 4* 4+  Extension 4+ 4  Radial deviation    Ulnar deviation        (* = pain; Blank rows = not tested)   Palpation Location  LEFT  RIGHT           Suboccipitals 0 1  Cervical paraspinals 0 0  Upper Trapezius 0 2  Levator Scapulae  2  Rhomboid Major/Minor  1  (Blank rows = not tested) Graded on 0-4 scale (0 = no pain, 1 = pain, 2 = pain with wincing/grimacing/flinching, 3 = pain with withdrawal, 4 = unwilling to allow palpation), (Blank rows = not tested)  Repeated Movements Repeated retraction in supine: Pressure along suboccipital region during, no significant pain reported in supine position afterward   Passive Accessory Intervertebral Motion Cervical sideglide; C3-6 bilateral hypomobility Cervical CPA; C5-7 decreased mobility, improved motion with higher C-spine levels   SPECIAL TESTS Spurlings A (ipsilateral lateral flexion/axial compression): R: Negative L: Negative Distraction Test: Negative  Hoffman Sign (cervical cord compression): R: Negative L: Negative ULTT Median: R: Not examined L: Not examined ULTT Ulnar: R: Not examined L: Not examined ULTT Radial: R: Not examined L: Not examined     TODAY'S TREATMENT    SUBJECTIVE STATEMENT:   Pt reports notable soreness about one day after eval. Patient  reports she is tolerating exercises well at this time. Patient reports R upper trap/periscapular region is painful at arrival - described as sore. No "shooting pains." Pt reports 3/10 pain at arrival.     Manual Therapy - for symptom modulation, soft tissue sensitivity and mobility, joint mobility, ROM   Manual cervical traction; x 10 sec on, intermittent pulls;   *Update for passive accessories (see above)  STM C3-5 splenius capitis    Therapeutic Exercise - for improved soft tissue flexibility and extensibility as needed for ROM, shoulder complex mobility  Pec stretching, dynamic; horizontal abduction in supine with elbow flexed 90 deg; 2 sec hold; x12 Upper trapezius stretch; 2 x 30 sec  PATIENT EDUCATION: HEP update and review. Discussed post-needling expectations.    PATIENT EDUCATION:  Education details: see above for patient education details Person educated: Patient Education method: Explanation, Demonstration, and Handouts Education comprehension: verbalized understanding and returned demonstration   HOME EXERCISE PROGRAM:  Access Code: [email or web address] URL: [email or web address] Date: 03/15/2023 Prepared by: Consuela Mimes  Exercises - Supine Chin Tuck  - 3 x daily - 7 x weekly - 2 sets - 10 reps - 1sec hold - Seated Upper Trapezius Stretch  - 2 x daily - 7 x weekly - 3 sets - 30sec hold - Seated Scapular Retraction  - 2 x daily - 7 x weekly - 2 sets - 10 reps - 5sec hold - Supine Pectoralis Stretch  - 2 x daily - 7 x weekly - 2 sets - 10 reps - 2 sec hold   ASSESSMENT:  CLINICAL IMPRESSION: Patient is a 82 y.o. female who was seen today for physical therapy evaluation and treatment for neck pain with R-sided referral. Negative Spurling's and distraction test today. No numbness/tingling or sensory changes. Patient has primary region of pain and tenderness along R upper trapezius/R periscapular region with moderate referral to R upper arm. History of R  mastectomy 08/21/14 and subsequent R medial chest wall excision of growth on 06/30/22. Pt reports challenges with R shoulder pain and mobility deficits following these procedures. Pt has current deficits in: decreased cervical spine AROM, pain with loading R deltoid and pectoral region/decreased R shoulder and biceps strength, difficulty accessing overhead motion of R shoulder, postural changes, tenderness to palpation and pain along R upper trapezius/R periscapular region with moderate intermittent referral to R upper arm. Pt will benefit from skilled  PT services to address deficits and improve function.   OBJECTIVE IMPAIRMENTS: decreased ROM, decreased strength, hypomobility, impaired flexibility, impaired UE functional use, postural dysfunction, and pain.   ACTIVITY LIMITATIONS: carrying, lifting, reach over head, hygiene/grooming, and caring for others  PARTICIPATION LIMITATIONS: meal prep, cleaning, driving, and community activity  PERSONAL FACTORS: Age, Past/current experiences, Time since onset of injury/illness/exacerbation, and 3+ comorbidities: hx of breast cancer,   are also affecting patient's functional outcome.   REHAB POTENTIAL: Good  CLINICAL DECISION MAKING: Evolving/moderate complexity  EVALUATION COMPLEXITY: Moderate   GOALS: Goals reviewed with patient? Yes  SHORT TERM GOALS: Target date: 03/30/2023  Pt will be independent with HEP to improve strength and decrease neck pain to improve pain-free function at home and work. Baseline: 03/08/23: Baseline HEP initiated, link to program provided via e-mail.  Goal status: INITIAL   LONG TERM GOALS: Target date: 04/29/2023  Pt will increase FOTO to at least 64 to demonstrate significant improvement in function at home and work related to neck pain  Baseline: 03/08/23: 57 Goal status: INITIAL  2.  Pt will decrease worst neck pain by at least 2 points on the NPRS in order to demonstrate clinically significant reduction in neck  pain. Baseline: 03/08/23: 8/10 pain at worst Goal status: INITIAL  3.  Pt will demonstrate cervical rotation to 60 deg or greater bilat as needed for scanning environment and driving     Baseline: 16/1/09: Cervical AROM: Rotation R 52, L 47 Goal status: INITIAL  4.  Patient will tolerate full bilateral shoulder elevation without reproduction of pain as needed for reaching, overhead object manipulation, and intermittently for self-care ADLs Baseline: 03/08/23: Shoulder flexion to 140 bilat with R shoulder/upper trap pain with overhead motion.  Goal status: INITIAL   PLAN: PT FREQUENCY: 1-2x/week  PT DURATION: 6 weeks  PLANNED INTERVENTIONS: Therapeutic exercises, Therapeutic activity, Neuromuscular re-education, Patient/Family education, Self Care, Joint mobilization, Dry Needling, Electrical stimulation, Spinal mobilization, Cryotherapy, Moist heat, Traction, Manual therapy, and Re-evaluation.  PLAN FOR NEXT SESSION: Soft tissue mobilization and manual C-spine mobilization techniques to improve ROM. Consider dry needling for R upper trap and R periscapular soft tissue. Continue with postural re-edu drills and manual therapy + exercise to improve pectoral/anterior shoulder girdle flexibility.    Consuela Mimes, PT, DPT #[id number]  Gertie Exon, PT 03/15/2023, 12:48 PM

## 2023-03-17 ENCOUNTER — Encounter: Payer: Self-pay | Admitting: Physical Therapy

## 2023-03-17 ENCOUNTER — Ambulatory Visit: Payer: Medicare Other | Admitting: Physical Therapy

## 2023-03-17 DIAGNOSIS — M542 Cervicalgia: Secondary | ICD-10-CM

## 2023-03-17 DIAGNOSIS — M79621 Pain in right upper arm: Secondary | ICD-10-CM

## 2023-03-17 DIAGNOSIS — M6281 Muscle weakness (generalized): Secondary | ICD-10-CM

## 2023-03-17 NOTE — Therapy (Signed)
OUTPATIENT PHYSICAL THERAPY TREATMENT   Patient Name: Sydney Parks MRN: [id number] DOB:03/11/1941, 82 y.o., female Today's Date: 03/17/2023  END OF SESSION:  PT End of Session - 03/17/23 1413     Visit Number 3    Number of Visits 13    Date for PT Re-Evaluation 04/19/23    Authorization Type Medicare    Authorization Time Period Initial eval 03/08/23    PT Start Time 1414    PT Stop Time 1500    PT Time Calculation (min) 46 min    Activity Tolerance Patient tolerated treatment well    Behavior During Therapy [facility] for tasks assessed/performed               Past Medical History:  Diagnosis Date   Arthritis    Breast cancer (HCC) 2000 and 2016   right breast ca twice.  Mastectomy in 2016   Cataract    Diabetes mellitus without complication (HCC)    GERD (gastroesophageal reflux disease)    Hx of migraines    from ages [age]   Hypercholesteremia    Hypertension    Kidney stone on right side    Personal history of radiation therapy 2000   right breast ca   Thyroid disease    Varicose vein of leg    Past Surgical History:  Procedure Laterality Date   BREAST BIOPSY Right 2000   breast ca   BREAST BIOPSY Right 2016   invasive mammary-mastectomy   BREAST BIOPSY Right 05/27/2022   [place] RT BREAST BX W LOC DEV 1ST LESION IMG BX SPEC US GUIDE 05/27/2022 [facility]   BREAST LUMPECTOMY Right 2000   breast ca with rad tx and tamoxifen   CATARACT EXTRACTION W/ INTRAOCULAR LENS  IMPLANT, BILATERAL     MASTECTOMY Right 2016   + complete mastectomy and letrozole tx   SIMPLE MASTECTOMY WITH AXILLARY SENTINEL NODE BIOPSY Right 08/21/2014   Procedure: SIMPLE MASTECTOMY WITH AXILLARY SENTINEL NODE BIOPSY;  Surgeon: Renda Rolls, MD;  Location: [facility] ORS;  Service: General;  Laterality: Right;   TONSILLECTOMY     VARICOSE VEIN SURGERY Left    laser surgery   Patient Active Problem List   Diagnosis Date Noted   Recurrent breast cancer, right (HCC) 06/01/2022   Chronic  venous insufficiency 09/17/2021   Lymphedema 09/17/2021   Aortic insufficiency 10/05/2018   PVC's (premature ventricular contractions) 09/16/2018   History of right breast cancer 07/19/2018   Gout 07/05/2017   Malignant neoplasm of breast (HCC) 03/06/2015   Type 2 diabetes mellitus (HCC) 03/06/2015   Hypercholesterolemia 03/06/2015   S/P mastectomy 08/21/2014   Breast cancer (HCC) 08/21/2014   Arthritis 08/08/2014   Diabetes mellitus, type 2 (HCC) 08/08/2014   Hypercholesteremia 08/08/2014   Hypertension 08/08/2014   Hypothyroidism 08/08/2014   Osteopenia 04/06/2001    PCP: Myrene Buddy, NP  REFERRING PROVIDER: Elijah Birk, MD  REFERRING DIAG:  M54.2 (ICD-10-CM) - Cervicalgia  M54.12 (ICD-10-CM) - Radiculopathy, cervical region    RATIONALE FOR EVALUATION AND TREATMENT: Rehabilitation  THERAPY DIAG: Cervicalgia  Pain in right upper arm  Muscle weakness (generalized)  ONSET DATE: Since surgical excision of bone along sternum/R rib cage - 2 years ago  FOLLOW-UP APPT SCHEDULED WITH REFERRING PROVIDER: Yes ; pt to f/u with Dr. Mariah Milling after completion of PT  PERTINENT HISTORY: Patient is an 82 year old female referred for neck pain/cervical radiculopathy with R shoulder pain referred by Filomena Jungling, MD.  Pt has husband with Parkinson's disease for  whom she is primary caregiver/support person. Hx of R shoulder Fx/greater tuberosity Fx with no surgery. Pain into upper back and R shoulder worsened following breast cancer surgery. No numbness/tingling. Per referring provider, symptoms are worse with lifting/cooking/cleaning. Symptoms are better with sitting up straight. Patient reports pain affecting R upper trap and R upper arm. Hx of breast cancer on her R side; Hx of scar tissue along region of R mastectomy Pt believes that how she was laid on the table during the surgery may have affected her. Pt reports pain affecting R upper trapezius and down to R  periscapular region. Pt states it is not severe; she did not take Gabapentin - she usually takes it morning and evening. Pt denies not feel she has posterior cervical pain. Pt reports headaches at night 2-3x/week or more.   Pain:  Pain Intensity: Present: 4/10, Best: 4/10, Worst: 8/10 Pain location: R upper trapezius, R periscapular region Pain Quality: aching , "sore" Radiating: Yes , to R upper arm Numbness/Tingling: No Focal Weakness: No Aggravating factors: moving arms around at high volume Relieving factors: sitting up straight, reclining back into couch, Gabapentin 24-hour pain behavior: None History of prior neck injury, pain, surgery, or therapy: Yes; Hx of R mastectomy and removal of some osseous tissue along sternum/rib cage; hx of R proximal humerus/greater tuberosity Fx Dominant hand: right  Imaging: Yes ;  MRI cervical spine 12/19/22 1. 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 2. Mild spinal canal stenosis at C4-C5 and C5-C6.  Electronically Signed   By: Lesia Hausen M.D.   On: 12/23/2022 08:55   Red flags (personal history of cancer, h/o spinal tumors, history of compression fracture, chills/fever, night sweats, nausea, vomiting, unrelenting pain): Positive for history of breast cancer only, s/p R mastectomy    PRECAUTIONS: None  WEIGHT BEARING RESTRICTIONS: No  FALLS: Has patient fallen in last 6 months? No  Living Environment Lives with: lives with their spouse; pt lives with son and his wife Lives in: House/apartment  Prior level of function: Independent  Occupational demands: Retired  Hobbies: Cooking  Patient Goals: Improved pain in R shoulder    OBJECTIVE:    Posture Increased thoracic kyphosis, forward head, pt rests in cervical  protraction   AROM AROM (Normal range in degrees) AROM 03/08/23  Cervical  Flexion (50) 50  Extension (80) 35  Right lateral flexion (45) 22  Left lateral flexion (45) 30  Right rotation (85) 52  Left rotation (85) 47  (* = pain; Blank rows = not tested)   Shoulder AROM: Flexion 140 bilat, Abduction 160 bilat (pain with elevation of R shoulder)   MMT MMT (out of 5) Right 03/08/23 Left 03/08/23      Shoulder   Flexion 4-* 4  Extension    Abduction 4* 4+* (pain on R paracervical region)  Internal rotation 4+ 4+  Horizontal abduction    Horizontal adduction    Lower Trapezius    Rhomboids        Elbow  Flexion 4+* 4+  Extension 4+ 4+  Pronation    Supination        Wrist  Flexion 4* 4+  Extension 4+ 4  Radial deviation    Ulnar deviation        (* = pain; Blank rows = not tested)   Palpation  Location LEFT  RIGHT           Suboccipitals 0 1  Cervical paraspinals 0 0  Upper Trapezius 0 2  Levator Scapulae  2  Rhomboid Major/Minor  1  (Blank rows = not tested) Graded on 0-4 scale (0 = no pain, 1 = pain, 2 = pain with wincing/grimacing/flinching, 3 = pain with withdrawal, 4 = unwilling to allow palpation), (Blank rows = not tested)  Repeated Movements Repeated retraction in supine: Pressure along suboccipital region during, no significant pain reported in supine position afterward   Passive Accessory Intervertebral Motion Cervical sideglide; C3-6 bilateral hypomobility Cervical CPA; C5-7 decreased mobility, improved motion with C2-4 levels   SPECIAL TESTS Spurlings A (ipsilateral lateral flexion/axial compression): R: Negative L: Negative Distraction Test: Negative  Hoffman Sign (cervical cord compression): R: Negative L: Negative ULTT Median: R: Not examined L: Not examined ULTT Ulnar: R: Not examined L: Not examined ULTT Radial: R: Not examined L: Not examined     TODAY'S TREATMENT    SUBJECTIVE STATEMENT:   Pt reports symptoms were "calm"  upon leaving last visit. She reports notable pulling along scar tissue near R breast/axilla. Patient reports that symptoms are mild at arrival, but it does feel "different" today. She reports some pain along R upper trap region yesterday - sore to touch. Patient reports no significant adverse effect or noticeable issue with DN. Pt reports some soreness in R upper quarter region.     Manual Therapy - for symptom modulation, soft tissue sensitivity and mobility, joint mobility, ROM   Manual cervical traction; x 10 sec on, intermittent pulls; x 5 minutes for symptom modulation Suboccipital release with upper cervical distraction; 4 x 30 sec bouts STM suboccipital mm, bilateral C3-5 splenius capitis/cervicis, R>L upper traps, R>L levator scapulae; x 15 minutes    Trigger Point Dry Needling (TDN), unbilled Education performed with patient regarding potential benefit of TDN. Reviewed precautions and risks with patient. Reviewed special precautions/risks over lung fields which include pneumothorax. Reviewed signs and symptoms of pneumothorax and advised pt to go to ER immediately if these symptoms develop advise them of dry needling treatment. Extensive time spent with pt to ensure full understanding of TDN risks. Pt provided verbal consent to treatment. TDN performed to R upper trapezius, R levator scapulae, and bilateral C6-7 splenius cervicis with 0.25 x 40 single needle placements with local twitch response (LTR). Pistoning technique utilized. Moderate soreness following intervention without significant pain.     Therapeutic Exercise - for improved soft tissue flexibility and extensibility as needed for ROM, shoulder complex mobility   PATIENT EDUCATION: HEP reviewed. Discussed current home program and encouraged continued HEP.    *not today* Pec stretching, dynamic; horizontal abduction in supine with elbow flexed 90 deg; 2 sec hold; x12 Upper trapezius stretch; 2 x 30 sec   PATIENT  EDUCATION:  Education details: see above for patient education details Person educated: Patient Education method: Explanation, Demonstration, and Handouts Education comprehension: verbalized understanding and returned demonstration   HOME EXERCISE PROGRAM:  Access Code: [email or web address] URL: [email or web address] Date: 03/15/2023 Prepared by: Consuela Mimes  Exercises - Supine Chin Tuck  - 3 x daily - 7 x weekly - 2 sets - 10 reps - 1sec hold - Seated Upper Trapezius Stretch  - 2 x daily - 7 x weekly - 3 sets - 30sec hold - Seated Scapular Retraction  - 2 x daily - 7 x weekly - 2 sets - 10 reps - 5sec hold - Supine Pectoralis  Stretch  - 2 x daily - 7 x weekly - 2 sets - 10 reps - 2 sec hold   ASSESSMENT:  CLINICAL IMPRESSION: Patient fortunately has relatively low NPRS at arrival. She does have remaining sensitivity along R upper trapezius/R periscapular region. Pt also has taut/tender lower splenius cervicis. We completed DN for this following use of manual traction for symptom modulation/nerve root decompression. Pt also has notable tension and sensitivity along R>L suboccipital mm. Pt tolerates suboccipital release technique well and reports only moderate post-needling soreness at end of session. Pt has current remaining  in: decreased cervical spine AROM, pain with loading R deltoid and pectoral region/decreased R shoulder and biceps strength, difficulty accessing overhead motion of R shoulder, postural changes, tenderness to palpation and pain along R upper trapezius/R periscapular region with moderate intermittent referral to R upper arm. Pt will benefit from skilled PT services to address deficits and improve function.   OBJECTIVE IMPAIRMENTS: decreased ROM, decreased strength, hypomobility, impaired flexibility, impaired UE functional use, postural dysfunction, and pain.   ACTIVITY LIMITATIONS: carrying, lifting, reach over head, hygiene/grooming, and caring for  others  PARTICIPATION LIMITATIONS: meal prep, cleaning, driving, and community activity  PERSONAL FACTORS: Age, Past/current experiences, Time since onset of injury/illness/exacerbation, and 3+ comorbidities: hx of breast cancer,   are also affecting patient's functional outcome.   REHAB POTENTIAL: Good  CLINICAL DECISION MAKING: Evolving/moderate complexity  EVALUATION COMPLEXITY: Moderate   GOALS: Goals reviewed with patient? Yes  SHORT TERM GOALS: Target date: 03/30/2023  Pt will be independent with HEP to improve strength and decrease neck pain to improve pain-free function at home and work. Baseline: 03/08/23: Baseline HEP initiated, link to program provided via e-mail.  Goal status: INITIAL   LONG TERM GOALS: Target date: 04/29/2023  Pt will increase FOTO to at least 64 to demonstrate significant improvement in function at home and work related to neck pain  Baseline: 03/08/23: 57 Goal status: INITIAL  2.  Pt will decrease worst neck pain by at least 2 points on the NPRS in order to demonstrate clinically significant reduction in neck pain. Baseline: 03/08/23: 8/10 pain at worst Goal status: INITIAL  3.  Pt will demonstrate cervical rotation to 60 deg or greater bilat as needed for scanning environment and driving     Baseline: 16/1/09: Cervical AROM: Rotation R 52, L 47 Goal status: INITIAL  4.  Patient will tolerate full bilateral shoulder elevation without reproduction of pain as needed for reaching, overhead object manipulation, and intermittently for self-care ADLs Baseline: 03/08/23: Shoulder flexion to 140 bilat with R shoulder/upper trap pain with overhead motion.  Goal status: INITIAL   PLAN: PT FREQUENCY: 1-2x/week  PT DURATION: 6 weeks  PLANNED INTERVENTIONS: Therapeutic exercises, Therapeutic activity, Neuromuscular re-education, Patient/Family education, Self Care, Joint mobilization, Dry Needling, Electrical stimulation, Spinal mobilization, Cryotherapy,  Moist heat, Traction, Manual therapy, and Re-evaluation.  PLAN FOR NEXT SESSION: Soft tissue mobilization and manual C-spine mobilization techniques to improve ROM. Consider dry needling for R upper trap and R periscapular soft tissue. Continue with postural re-edu drills and manual therapy + exercise to improve pectoral/anterior shoulder girdle flexibility.    Consuela Mimes, PT, DPT #[id number]  Gertie Exon, PT 03/17/2023, 2:59 PM

## 2023-03-22 ENCOUNTER — Encounter: Payer: Self-pay | Admitting: Physical Therapy

## 2023-03-22 ENCOUNTER — Ambulatory Visit: Payer: Medicare Other | Admitting: Physical Therapy

## 2023-03-22 DIAGNOSIS — M79621 Pain in right upper arm: Secondary | ICD-10-CM

## 2023-03-22 DIAGNOSIS — M542 Cervicalgia: Secondary | ICD-10-CM

## 2023-03-22 DIAGNOSIS — M6281 Muscle weakness (generalized): Secondary | ICD-10-CM

## 2023-03-22 NOTE — Therapy (Signed)
OUTPATIENT PHYSICAL THERAPY TREATMENT   Patient Name: Sydney Parks MRN: [id number] DOB:[birth year], 82 y.o., female Today's Date: 03/22/2023  END OF SESSION:  PT End of Session - 03/22/23 1240     Visit Number 4    Number of Visits 13    Date for PT Re-Evaluation 04/19/23    Authorization Type Medicare    Authorization Time Period Initial eval 03/08/23    PT Start Time 1245    PT Stop Time 1330    PT Time Calculation (min) 45 min    Activity Tolerance Patient tolerated treatment well    Behavior During Therapy [facility] for tasks assessed/performed               Past Medical History:  Diagnosis Date   Arthritis    Breast cancer (HCC) 2000 and 2016   right breast ca twice.  Mastectomy in 2016   Cataract    Diabetes mellitus without complication (HCC)    GERD (gastroesophageal reflux disease)    Hx of migraines    from ages [age]   Hypercholesteremia    Hypertension    Kidney stone on right side    Personal history of radiation therapy 2000   right breast ca   Thyroid disease    Varicose vein of leg    Past Surgical History:  Procedure Laterality Date   BREAST BIOPSY Right 2000   breast ca   BREAST BIOPSY Right 2016   invasive mammary-mastectomy   BREAST BIOPSY Right 05/27/2022   [place] RT BREAST BX W LOC DEV 1ST LESION IMG BX SPEC US GUIDE 05/27/2022 [facility]   BREAST LUMPECTOMY Right 2000   breast ca with rad tx and tamoxifen   CATARACT EXTRACTION W/ INTRAOCULAR LENS  IMPLANT, BILATERAL     MASTECTOMY Right 2016   + complete mastectomy and letrozole tx   SIMPLE MASTECTOMY WITH AXILLARY SENTINEL NODE BIOPSY Right 08/21/2014   Procedure: SIMPLE MASTECTOMY WITH AXILLARY SENTINEL NODE BIOPSY;  Surgeon: Renda Rolls, MD;  Location: [facility] ORS;  Service: General;  Laterality: Right;   TONSILLECTOMY     VARICOSE VEIN SURGERY Left    laser surgery   Patient Active Problem List   Diagnosis Date Noted   Recurrent breast cancer, right (HCC) 06/01/2022   Chronic  venous insufficiency 09/17/2021   Lymphedema 09/17/2021   Aortic insufficiency 10/05/2018   PVC's (premature ventricular contractions) 09/16/2018   History of right breast cancer 07/19/2018   Gout 07/05/2017   Malignant neoplasm of breast (HCC) 03/06/2015   Type 2 diabetes mellitus (HCC) 03/06/2015   Hypercholesterolemia 03/06/2015   S/P mastectomy 08/21/2014   Breast cancer (HCC) 08/21/2014   Arthritis 08/08/2014   Diabetes mellitus, type 2 (HCC) 08/08/2014   Hypercholesteremia 08/08/2014   Hypertension 08/08/2014   Hypothyroidism 08/08/2014   Osteopenia 04/06/2001    PCP: Myrene Buddy, NP  REFERRING PROVIDER: Elijah Birk, MD  REFERRING DIAG:  M54.2 (ICD-10-CM) - Cervicalgia  M54.12 (ICD-10-CM) - Radiculopathy, cervical region    RATIONALE FOR EVALUATION AND TREATMENT: Rehabilitation  THERAPY DIAG: Cervicalgia  Pain in right upper arm  Muscle weakness (generalized)  ONSET DATE: Since surgical excision of bone along sternum/R rib cage - 2 years ago  FOLLOW-UP APPT SCHEDULED WITH REFERRING PROVIDER: Yes ; pt to f/u with Dr. Mariah Milling after completion of PT  PERTINENT HISTORY: Patient is an 82 year old female referred for neck pain/cervical radiculopathy with R shoulder pain referred by Filomena Jungling, MD.  Pt has husband with Parkinson's disease for  whom she is primary caregiver/support person. Hx of R shoulder Fx/greater tuberosity Fx with no surgery. Pain into upper back and R shoulder worsened following breast cancer surgery. No numbness/tingling. Per referring provider, symptoms are worse with lifting/cooking/cleaning. Symptoms are better with sitting up straight. Patient reports pain affecting R upper trap and R upper arm. Hx of breast cancer on her R side; Hx of scar tissue along region of R mastectomy Pt believes that how she was laid on the table during the surgery may have affected her. Pt reports pain affecting R upper trapezius and down to R  periscapular region. Pt states it is not severe; she did not take Gabapentin - she usually takes it morning and evening. Pt denies not feel she has posterior cervical pain. Pt reports headaches at night 2-3x/week or more.   Pain:  Pain Intensity: Present: 4/10, Best: 4/10, Worst: 8/10 Pain location: R upper trapezius, R periscapular region Pain Quality: aching , "sore" Radiating: Yes , to R upper arm Numbness/Tingling: No Focal Weakness: No Aggravating factors: moving arms around at high volume Relieving factors: sitting up straight, reclining back into couch, Gabapentin 24-hour pain behavior: None History of prior neck injury, pain, surgery, or therapy: Yes; Hx of R mastectomy and removal of some osseous tissue along sternum/rib cage; hx of R proximal humerus/greater tuberosity Fx Dominant hand: right  Imaging: Yes ;  MRI cervical spine 12/19/22 1. 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 2. Mild spinal canal stenosis at C4-C5 and C5-C6.  Electronically Signed   By: Lesia Hausen M.D.   On: 12/23/2022 08:55   Red flags (personal history of cancer, h/o spinal tumors, history of compression fracture, chills/fever, night sweats, nausea, vomiting, unrelenting pain): Positive for history of breast cancer only, s/p R mastectomy    PRECAUTIONS: None  WEIGHT BEARING RESTRICTIONS: No  FALLS: Has patient fallen in last 6 months? No  Living Environment Lives with: lives with their spouse; pt lives with son and his wife Lives in: House/apartment  Prior level of function: Independent  Occupational demands: Retired  Hobbies: Cooking  Patient Goals: Improved pain in R shoulder    OBJECTIVE:    Posture Increased thoracic kyphosis, forward head, pt rests in cervical  protraction   AROM AROM (Normal range in degrees) AROM 03/08/23  Cervical  Flexion (50) 50  Extension (80) 35  Right lateral flexion (45) 22  Left lateral flexion (45) 30  Right rotation (85) 52  Left rotation (85) 47  (* = pain; Blank rows = not tested)   Shoulder AROM: Flexion 140 bilat, Abduction 160 bilat (pain with elevation of R shoulder)   MMT MMT (out of 5) Right 03/08/23 Left 03/08/23      Shoulder   Flexion 4-* 4  Extension    Abduction 4* 4+* (pain on R paracervical region)  Internal rotation 4+ 4+  Horizontal abduction    Horizontal adduction    Lower Trapezius    Rhomboids        Elbow  Flexion 4+* 4+  Extension 4+ 4+  Pronation    Supination        Wrist  Flexion 4* 4+  Extension 4+ 4  Radial deviation    Ulnar deviation        (* = pain; Blank rows = not tested)   Palpation  Location LEFT  RIGHT           Suboccipitals 0 1  Cervical paraspinals 0 0  Upper Trapezius 0 2  Levator Scapulae  2  Rhomboid Major/Minor  1  (Blank rows = not tested) Graded on 0-4 scale (0 = no pain, 1 = pain, 2 = pain with wincing/grimacing/flinching, 3 = pain with withdrawal, 4 = unwilling to allow palpation), (Blank rows = not tested)  Repeated Movements Repeated retraction in supine: Pressure along suboccipital region during, no significant pain reported in supine position afterward   Passive Accessory Intervertebral Motion Cervical sideglide; C3-6 bilateral hypomobility Cervical CPA; C5-7 decreased mobility, improved motion with C2-4 levels   SPECIAL TESTS Spurlings A (ipsilateral lateral flexion/axial compression): R: Negative L: Negative Distraction Test: Negative  Hoffman Sign (cervical cord compression): R: Negative L: Negative ULTT Median: R: Not examined L: Not examined ULTT Ulnar: R: Not examined L: Not examined ULTT Radial: R: Not examined L: Not examined     TODAY'S TREATMENT    SUBJECTIVE STATEMENT:   Pt reports some R paracervical  discomfort with pt being "a little sore" along upper trap region. Patient reports having a hard time sleeping, but not due to pain. Patient reports she is doing okay with her exercises - missed them Saturday/Sunday. Patient reports no recent upper arm or deltoid pain. Pt denies HA over last few days. She is unsure if dry needling had any significant treatment effect for her.     Manual Therapy - for symptom modulation, soft tissue sensitivity and mobility, joint mobility, ROM   Manual cervical traction; x 10 sec on, intermittent pulls; x 5 minutes for symptom modulation Suboccipital release with upper cervical distraction; 2 x 30 sec bouts STM suboccipital mm, bilateral C3-5 splenius capitis/cervicis, R>L upper traps, R>L levator scapulae; x 12 minutes DTM/TPR R upper trapezius; x 3 minutes Cervical sideglides; C4-6, emphasis on R to L; 2 x 30 sec bouts   *not today* Trigger Point Dry Needling (TDN), unbilled Education performed with patient regarding potential benefit of TDN. Reviewed precautions and risks with patient. Reviewed special precautions/risks over lung fields which include pneumothorax. Reviewed signs and symptoms of pneumothorax and advised pt to go to ER immediately if these symptoms develop advise them of dry needling treatment. Extensive time spent with pt to ensure full understanding of TDN risks. Pt provided verbal consent to treatment. TDN performed to R upper trapezius, R levator scapulae, and bilateral C6-7 splenius cervicis with 0.25 x 40 single needle placements with local twitch response (LTR). Pistoning technique utilized. Moderate soreness following intervention without significant pain.      Therapeutic Exercise - for improved soft tissue flexibility and extensibility as needed for ROM, shoulder complex mobility   AROM quick screen Cervical flexion:  WNL (pulling on R side) Cervical extension: Mod motion loss  Lateral flexion: Right WNL*, Left WNL* Cervical  rotation: Right Mod motion loss*, Left Mod motion loss *Indicates pain   Cervical SNAG; x10 for R rotation  -demonstration and heavy cueing for technique   PATIENT EDUCATION: HEP updated/reviewed.    *not today* Pec stretching, dynamic; horizontal abduction in supine with elbow flexed 90 deg; 2 sec hold; x12 Upper trapezius stretch; 2 x 30 sec   PATIENT EDUCATION:  Education details: see above for patient education details Person educated: Patient Education method: Explanation, Demonstration, and Handouts Education comprehension: verbalized understanding and returned demonstration   HOME EXERCISE PROGRAM:  Access Code: [email or web address] URL: [email or web address] Date: 03/22/2023 Prepared by: Consuela Mimes  Exercises - Supine Chin Tuck  - 3 x daily - 7 x weekly - 2 sets - 10 reps - 1sec hold - Seated Upper Trapezius Stretch  - 2 x daily - 7 x weekly - 3 sets - 30sec hold - Seated Scapular Retraction  - 2 x daily - 7 x weekly - 2 sets - 10 reps - 5sec hold - Supine Pectoralis Stretch  - 2 x daily - 7 x weekly - 2 sets - 10 reps - 2 sec hold - Seated Assisted Cervical Rotation with Towel  - 1 x daily - 7 x weekly - 10 reps   ASSESSMENT:  CLINICAL IMPRESSION: Patient denies recent HA and reports ongoing soreness affecting R upper trapezius and R paracervical pain that is not severe at arrival this AM. She demonstrates [facility] sagittal plane AROM. She has moderate motion loss with rotation and lateral flexion with pain primarily on R regardless of direction she is turning head. We updated her program to improve SNAGs to improve ability to access C-spine AROM. Pt has current remaining  in: decreased cervical spine AROM, pain with loading R deltoid and pectoral region/decreased R shoulder and biceps strength, difficulty accessing overhead motion of R shoulder, postural changes, tenderness to palpation and pain along R upper trapezius/R periscapular region with moderate  intermittent referral to R upper arm. Pt will benefit from skilled PT services to address deficits and improve function.   OBJECTIVE IMPAIRMENTS: decreased ROM, decreased strength, hypomobility, impaired flexibility, impaired UE functional use, postural dysfunction, and pain.   ACTIVITY LIMITATIONS: carrying, lifting, reach over head, hygiene/grooming, and caring for others  PARTICIPATION LIMITATIONS: meal prep, cleaning, driving, and community activity  PERSONAL FACTORS: Age, Past/current experiences, Time since onset of injury/illness/exacerbation, and 3+ comorbidities: hx of breast cancer,   are also affecting patient's functional outcome.   REHAB POTENTIAL: Good  CLINICAL DECISION MAKING: Evolving/moderate complexity  EVALUATION COMPLEXITY: Moderate   GOALS: Goals reviewed with patient? Yes  SHORT TERM GOALS: Target date: 03/30/2023  Pt will be independent with HEP to improve strength and decrease neck pain to improve pain-free function at home and work. Baseline: 03/08/23: Baseline HEP initiated, link to program provided via e-mail.  Goal status: INITIAL   LONG TERM GOALS: Target date: 04/29/2023  Pt will increase FOTO to at least 64 to demonstrate significant improvement in function at home and work related to neck pain  Baseline: 03/08/23: 57 Goal status: INITIAL  2.  Pt will decrease worst neck pain by at least 2 points on the NPRS in order to demonstrate clinically significant reduction in neck pain. Baseline: 03/08/23: 8/10 pain at worst Goal status: INITIAL  3.  Pt will demonstrate cervical rotation to 60 deg or greater bilat as needed for scanning environment and driving     Baseline: 16/1/09: Cervical AROM: Rotation R 52, L 47 Goal status: INITIAL  4.  Patient will tolerate full bilateral shoulder elevation without reproduction of pain as needed for reaching, overhead object manipulation, and intermittently for self-care ADLs Baseline: 03/08/23: Shoulder flexion to  140 bilat with R shoulder/upper trap pain with overhead motion.  Goal status: INITIAL   PLAN: PT FREQUENCY: 1-2x/week  PT DURATION: 6 weeks  PLANNED INTERVENTIONS: Therapeutic exercises, Therapeutic activity, Neuromuscular re-education, Patient/Family education, Self Care, Joint mobilization, Dry Needling, Electrical stimulation, Spinal mobilization, Cryotherapy, Moist heat, Traction, Manual therapy, and Re-evaluation.  PLAN FOR NEXT SESSION: Soft tissue mobilization and manual C-spine mobilization techniques to improve ROM. Consider dry needling for R upper trap  and R periscapular soft tissue. Continue with postural re-edu drills and manual therapy + exercise to improve pectoral/anterior shoulder girdle flexibility.    Consuela Mimes, PT, DPT #[id number]  Gertie Exon, PT 03/22/2023, 3:45 PM

## 2023-03-24 ENCOUNTER — Encounter: Payer: Self-pay | Admitting: Physical Therapy

## 2023-03-24 ENCOUNTER — Ambulatory Visit: Payer: Medicare Other | Admitting: Physical Therapy

## 2023-03-24 DIAGNOSIS — M542 Cervicalgia: Secondary | ICD-10-CM | POA: Diagnosis not present

## 2023-03-24 DIAGNOSIS — M6281 Muscle weakness (generalized): Secondary | ICD-10-CM

## 2023-03-24 DIAGNOSIS — M79621 Pain in right upper arm: Secondary | ICD-10-CM

## 2023-03-24 NOTE — Therapy (Signed)
OUTPATIENT PHYSICAL THERAPY TREATMENT   Patient Name: Sydney Parks MRN: [id number] DOB:03-14-1941, 82 y.o., female Today's Date: 03/24/2023  END OF SESSION:  PT End of Session - 03/24/23 1246     Visit Number 5    Number of Visits 13    Date for PT Re-Evaluation 04/19/23    Authorization Type Medicare    Authorization Time Period Initial eval 03/08/23    PT Start Time 1246    PT Stop Time 1340    PT Time Calculation (min) 54 min    Activity Tolerance Patient tolerated treatment well    Behavior During Therapy [facility] for tasks assessed/performed                Past Medical History:  Diagnosis Date   Arthritis    Breast cancer (HCC) 2000 and 2016   right breast ca twice.  Mastectomy in 2016   Cataract    Diabetes mellitus without complication (HCC)    GERD (gastroesophageal reflux disease)    Hx of migraines    from ages [age]   Hypercholesteremia    Hypertension    Kidney stone on right side    Personal history of radiation therapy 2000   right breast ca   Thyroid disease    Varicose vein of leg    Past Surgical History:  Procedure Laterality Date   BREAST BIOPSY Right 2000   breast ca   BREAST BIOPSY Right 2016   invasive mammary-mastectomy   BREAST BIOPSY Right 05/27/2022   [place] RT BREAST BX W LOC DEV 1ST LESION IMG BX SPEC US GUIDE 05/27/2022 [facility]   BREAST LUMPECTOMY Right 2000   breast ca with rad tx and tamoxifen   CATARACT EXTRACTION W/ INTRAOCULAR LENS  IMPLANT, BILATERAL     MASTECTOMY Right 2016   + complete mastectomy and letrozole tx   SIMPLE MASTECTOMY WITH AXILLARY SENTINEL NODE BIOPSY Right 08/21/2014   Procedure: SIMPLE MASTECTOMY WITH AXILLARY SENTINEL NODE BIOPSY;  Surgeon: Renda Rolls, MD;  Location: [facility] ORS;  Service: General;  Laterality: Right;   TONSILLECTOMY     VARICOSE VEIN SURGERY Left    laser surgery   Patient Active Problem List   Diagnosis Date Noted   Recurrent breast cancer, right (HCC) 06/01/2022   Chronic  venous insufficiency 09/17/2021   Lymphedema 09/17/2021   Aortic insufficiency 10/05/2018   PVC's (premature ventricular contractions) 09/16/2018   History of right breast cancer 07/19/2018   Gout 07/05/2017   Malignant neoplasm of breast (HCC) 03/06/2015   Type 2 diabetes mellitus (HCC) 03/06/2015   Hypercholesterolemia 03/06/2015   S/P mastectomy 08/21/2014   Breast cancer (HCC) 08/21/2014   Arthritis 08/08/2014   Diabetes mellitus, type 2 (HCC) 08/08/2014   Hypercholesteremia 08/08/2014   Hypertension 08/08/2014   Hypothyroidism 08/08/2014   Osteopenia 04/06/2001    PCP: Myrene Buddy, NP  REFERRING PROVIDER: Elijah Birk, MD  REFERRING DIAG:  M54.2 (ICD-10-CM) - Cervicalgia  M54.12 (ICD-10-CM) - Radiculopathy, cervical region    RATIONALE FOR EVALUATION AND TREATMENT: Rehabilitation  THERAPY DIAG: Cervicalgia  Pain in right upper arm  Muscle weakness (generalized)  ONSET DATE: Since surgical excision of bone along sternum/R rib cage - 2 years ago  FOLLOW-UP APPT SCHEDULED WITH REFERRING PROVIDER: Yes ; pt to f/u with Dr. Mariah Milling after completion of PT  PERTINENT HISTORY: Patient is an 82 year old female referred for neck pain/cervical radiculopathy with R shoulder pain referred by Filomena Jungling, MD.  Pt has husband with Parkinson's disease  for whom she is primary caregiver/support person. Hx of R shoulder Fx/greater tuberosity Fx with no surgery. Pain into upper back and R shoulder worsened following breast cancer surgery. No numbness/tingling. Per referring provider, symptoms are worse with lifting/cooking/cleaning. Symptoms are better with sitting up straight. Patient reports pain affecting R upper trap and R upper arm. Hx of breast cancer on her R side; Hx of scar tissue along region of R mastectomy Pt believes that how she was laid on the table during the surgery may have affected her. Pt reports pain affecting R upper trapezius and down to R  periscapular region. Pt states it is not severe; she did not take Gabapentin - she usually takes it morning and evening. Pt denies not feel she has posterior cervical pain. Pt reports headaches at night 2-3x/week or more.   Pain:  Pain Intensity: Present: 4/10, Best: 4/10, Worst: 8/10 Pain location: R upper trapezius, R periscapular region Pain Quality: aching , "sore" Radiating: Yes , to R upper arm Numbness/Tingling: No Focal Weakness: No Aggravating factors: moving arms around at high volume Relieving factors: sitting up straight, reclining back into couch, Gabapentin 24-hour pain behavior: None History of prior neck injury, pain, surgery, or therapy: Yes; Hx of R mastectomy and removal of some osseous tissue along sternum/rib cage; hx of R proximal humerus/greater tuberosity Fx Dominant hand: right  Imaging: Yes ;  MRI cervical spine 12/19/22 1. 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 2. Mild spinal canal stenosis at C4-C5 and C5-C6.  Electronically Signed   By: Lesia Hausen M.D.   On: 12/23/2022 08:55   Red flags (personal history of cancer, h/o spinal tumors, history of compression fracture, chills/fever, night sweats, nausea, vomiting, unrelenting pain): Positive for history of breast cancer only, s/p R mastectomy    PRECAUTIONS: None  WEIGHT BEARING RESTRICTIONS: No  FALLS: Has patient fallen in last 6 months? No  Living Environment Lives with: lives with their spouse; pt lives with son and his wife Lives in: House/apartment  Prior level of function: Independent  Occupational demands: Retired  Hobbies: Cooking  Patient Goals: Improved pain in R shoulder    OBJECTIVE:    Posture Increased thoracic kyphosis, forward head, pt rests in cervical  protraction   AROM AROM (Normal range in degrees) AROM 03/08/23  Cervical  Flexion (50) 50  Extension (80) 35  Right lateral flexion (45) 22  Left lateral flexion (45) 30  Right rotation (85) 52  Left rotation (85) 47  (* = pain; Blank rows = not tested)   Shoulder AROM: Flexion 140 bilat, Abduction 160 bilat (pain with elevation of R shoulder)   MMT MMT (out of 5) Right 03/08/23 Left 03/08/23      Shoulder   Flexion 4-* 4  Extension    Abduction 4* 4+* (pain on R paracervical region)  Internal rotation 4+ 4+  Horizontal abduction    Horizontal adduction    Lower Trapezius    Rhomboids        Elbow  Flexion 4+* 4+  Extension 4+ 4+  Pronation    Supination        Wrist  Flexion 4* 4+  Extension 4+ 4  Radial deviation    Ulnar deviation        (* = pain; Blank rows = not tested)  Palpation Location LEFT  RIGHT           Suboccipitals 0 1  Cervical paraspinals 0 0  Upper Trapezius 0 2  Levator Scapulae  2  Rhomboid Major/Minor  1  (Blank rows = not tested) Graded on 0-4 scale (0 = no pain, 1 = pain, 2 = pain with wincing/grimacing/flinching, 3 = pain with withdrawal, 4 = unwilling to allow palpation), (Blank rows = not tested)  Repeated Movements Repeated retraction in supine: Pressure along suboccipital region during, no significant pain reported in supine position afterward   Passive Accessory Intervertebral Motion Cervical sideglide; C3-6 bilateral hypomobility Cervical CPA; C5-7 decreased mobility, improved motion with C2-4 levels   SPECIAL TESTS Spurlings A (ipsilateral lateral flexion/axial compression): R: Negative L: Negative Distraction Test: Negative  Hoffman Sign (cervical cord compression): R: Negative L: Negative ULTT Median: R: Not examined L: Not examined ULTT Ulnar: R: Not examined L: Not examined ULTT Radial: R: Not examined L: Not examined     TODAY'S TREATMENT    SUBJECTIVE STATEMENT:   Pt reports having difficulty  sleeping Monday night. She was up until 5 AM the following morning. She slept fairly well last night. Patient reports not being up due to pain. Patient reports feeling "really sore" along bilateral upper traps (R worse than L); some soreness along lower paracervical region. Patient reports no significant loss of treatment effect with not doing dry needling last visit.     Manual Therapy - for symptom modulation, soft tissue sensitivity and mobility, joint mobility, ROM   Manual cervical traction; x 10 sec on, intermittent pulls; x 5 minutes for symptom modulation  STM suboccipital mm, bilateral C3-5 splenius capitis/cervicis, R>L upper traps, R>L levator scapulae; x 12 minutes DTM/TPR R upper trapezius; x 4 minutes Cervical sideglides; C4-6, emphasis on R to L; 2 x 30 sec bouts Passive R upper trapezius stretching; 2 x 30 seconds  *next visit* DTM/release along R pec major   *not today* Suboccipital release with upper cervical distraction; 2 x 30 sec bouts Trigger Point Dry Needling (TDN), unbilled Education performed with patient regarding potential benefit of TDN. Reviewed precautions and risks with patient. Reviewed special precautions/risks over lung fields which include pneumothorax. Reviewed signs and symptoms of pneumothorax and advised pt to go to ER immediately if these symptoms develop advise them of dry needling treatment. Extensive time spent with pt to ensure full understanding of TDN risks. Pt provided verbal consent to treatment. TDN performed to R upper trapezius, R levator scapulae, and bilateral C6-7 splenius cervicis with 0.25 x 40 single needle placements with local twitch response (LTR). Pistoning technique utilized. Moderate soreness following intervention without significant pain.      Therapeutic Exercise - for improved soft tissue flexibility and extensibility as needed for ROM, shoulder complex mobility   Scapular clock, posterior roll; x20  -pain with  retraction/tightness along R lateral pectoral region  Cervical SNAG; x10 for R rotation  -for review  Tband row with blue Tband, upright posure; 2 x 10, 3 sec hold    PATIENT EDUCATION: HEP reviewed. Discussed general sleep hygiene guidelines and provided educational handout for sleep guidelines. Discussed following up with PCP regarding difficulty with sleeping for potential medical management.    *not today* Pec stretching, dynamic; horizontal abduction in supine with elbow flexed 90 deg; 2 sec hold; x12 Upper trapezius stretch; 2 x 30 sec   MHP (unbilled) utilized post-treatment for analgesic effect and improved soft tissue extensibility; along posterior C-spine/upper traps in  prone lying; x 6 minutes    PATIENT EDUCATION:  Education details: see above for patient education details Person educated: Patient Education method: Explanation, Demonstration, and Handouts Education comprehension: verbalized understanding and returned demonstration   HOME EXERCISE PROGRAM:  Access Code: [email or web address] URL: [email or web address] Date: 03/22/2023 Prepared by: Consuela Mimes  Exercises - Supine Chin Tuck  - 3 x daily - 7 x weekly - 2 sets - 10 reps - 1sec hold - Seated Upper Trapezius Stretch  - 2 x daily - 7 x weekly - 3 sets - 30sec hold - Seated Scapular Retraction  - 2 x daily - 7 x weekly - 2 sets - 10 reps - 5sec hold - Supine Pectoralis Stretch  - 2 x daily - 7 x weekly - 2 sets - 10 reps - 2 sec hold - Seated Assisted Cervical Rotation with Towel  - 1 x daily - 7 x weekly - 10 reps   ASSESSMENT:  CLINICAL IMPRESSION: Patient exhibits remaining stiffness with R>L C-spine rotation. She has notable soreness across upper traps and lower C-spine. Pain is not mitigated with use of traction today. We utilized DTM and TPR for upper traps and MHP post-session for soft tissue extensibility/analgesic effect. We are able to progress to low volume of periscapular isotonics  without significant pain this afternoon. Pt has current remaining deficits in: decreased cervical spine AROM, pain with loading R deltoid and pectoral region/decreased R shoulder and biceps strength, difficulty accessing overhead motion of R shoulder, postural changes, tenderness to palpation and pain along R upper trapezius/R periscapular region with moderate intermittent referral to R upper arm. Pt will benefit from skilled PT services to address deficits and improve function.   OBJECTIVE IMPAIRMENTS: decreased ROM, decreased strength, hypomobility, impaired flexibility, impaired UE functional use, postural dysfunction, and pain.   ACTIVITY LIMITATIONS: carrying, lifting, reach over head, hygiene/grooming, and caring for others  PARTICIPATION LIMITATIONS: meal prep, cleaning, driving, and community activity  PERSONAL FACTORS: Age, Past/current experiences, Time since onset of injury/illness/exacerbation, and 3+ comorbidities: hx of breast cancer,   are also affecting patient's functional outcome.   REHAB POTENTIAL: Good  CLINICAL DECISION MAKING: Evolving/moderate complexity  EVALUATION COMPLEXITY: Moderate   GOALS: Goals reviewed with patient? Yes  SHORT TERM GOALS: Target date: 03/30/2023  Pt will be independent with HEP to improve strength and decrease neck pain to improve pain-free function at home and work. Baseline: 03/08/23: Baseline HEP initiated, link to program provided via e-mail.  Goal status: INITIAL   LONG TERM GOALS: Target date: 04/29/2023  Pt will increase FOTO to at least 64 to demonstrate significant improvement in function at home and work related to neck pain  Baseline: 03/08/23: 57 Goal status: INITIAL  2.  Pt will decrease worst neck pain by at least 2 points on the NPRS in order to demonstrate clinically significant reduction in neck pain. Baseline: 03/08/23: 8/10 pain at worst Goal status: INITIAL  3.  Pt will demonstrate cervical rotation to 60 deg or  greater bilat as needed for scanning environment and driving     Baseline: 37/6/28: Cervical AROM: Rotation R 52, L 47 Goal status: INITIAL  4.  Patient will tolerate full bilateral shoulder elevation without reproduction of pain as needed for reaching, overhead object manipulation, and intermittently for self-care ADLs Baseline: 03/08/23: Shoulder flexion to 140 bilat with R shoulder/upper trap pain with overhead motion.  Goal status: INITIAL   PLAN: PT FREQUENCY: 1-2x/week  PT DURATION: 6 weeks  PLANNED INTERVENTIONS: Therapeutic exercises, Therapeutic  activity, Neuromuscular re-education, Patient/Family education, Self Care, Joint mobilization, Dry Needling, Electrical stimulation, Spinal mobilization, Cryotherapy, Moist heat, Traction, Manual therapy, and Re-evaluation.  PLAN FOR NEXT SESSION: Soft tissue mobilization and manual C-spine mobilization techniques to improve ROM. Consider dry needling for R upper trap and R periscapular soft tissue. Continue with postural re-edu drills and manual therapy + exercise to improve pectoral/anterior shoulder girdle flexibility.    Consuela Mimes, PT, DPT #[id number]  Gertie Exon, PT 03/24/2023, 1:40 PM

## 2023-03-29 ENCOUNTER — Ambulatory Visit: Payer: Medicare Other

## 2023-03-29 DIAGNOSIS — M542 Cervicalgia: Secondary | ICD-10-CM | POA: Diagnosis not present

## 2023-03-29 DIAGNOSIS — M79621 Pain in right upper arm: Secondary | ICD-10-CM

## 2023-03-29 DIAGNOSIS — M6281 Muscle weakness (generalized): Secondary | ICD-10-CM

## 2023-03-29 DIAGNOSIS — R2681 Unsteadiness on feet: Secondary | ICD-10-CM

## 2023-03-29 NOTE — Therapy (Signed)
OUTPATIENT PHYSICAL THERAPY TREATMENT   Patient Name: Sydney Parks MRN: [id number] DOB:December 17, 1940, 82 y.o., female Today's Date: 03/29/2023  END OF SESSION:       Past Medical History:  Diagnosis Date   Arthritis    Breast cancer (HCC) 2000 and 2016   right breast ca twice.  Mastectomy in 2016   Cataract    Diabetes mellitus without complication (HCC)    GERD (gastroesophageal reflux disease)    Hx of migraines    from ages [age]   Hypercholesteremia    Hypertension    Kidney stone on right side    Personal history of radiation therapy 2000   right breast ca   Thyroid disease    Varicose vein of leg    Past Surgical History:  Procedure Laterality Date   BREAST BIOPSY Right 2000   breast ca   BREAST BIOPSY Right 2016   invasive mammary-mastectomy   BREAST BIOPSY Right 05/27/2022   [place] RT BREAST BX W LOC DEV 1ST LESION IMG BX SPEC US GUIDE 05/27/2022 [facility]   BREAST LUMPECTOMY Right 2000   breast ca with rad tx and tamoxifen   CATARACT EXTRACTION W/ INTRAOCULAR LENS  IMPLANT, BILATERAL     MASTECTOMY Right 2016   + complete mastectomy and letrozole tx   SIMPLE MASTECTOMY WITH AXILLARY SENTINEL NODE BIOPSY Right 08/21/2014   Procedure: SIMPLE MASTECTOMY WITH AXILLARY SENTINEL NODE BIOPSY;  Surgeon: Renda Rolls, MD;  Location: [facility];  Service: General;  Laterality: Right;   TONSILLECTOMY     VARICOSE VEIN SURGERY Left    laser surgery   Patient Active Problem List   Diagnosis Date Noted   Recurrent breast cancer, right (HCC) 06/01/2022   Chronic venous insufficiency 09/17/2021   Lymphedema 09/17/2021   Aortic insufficiency 10/05/2018   PVC's (premature ventricular contractions) 09/16/2018   History of right breast cancer 07/19/2018   Gout 07/05/2017   Malignant neoplasm of breast (HCC) 03/06/2015   Type 2 diabetes mellitus (HCC) 03/06/2015   Hypercholesterolemia 03/06/2015   S/P mastectomy 08/21/2014   Breast cancer (HCC) 08/21/2014   Arthritis  08/08/2014   Diabetes mellitus, type 2 (HCC) 08/08/2014   Hypercholesteremia 08/08/2014   Hypertension 08/08/2014   Hypothyroidism 08/08/2014   Osteopenia 04/06/2001    PCP: Myrene Buddy, NP  REFERRING PROVIDER: Elijah Birk, MD  REFERRING DIAG:  M54.2 (ICD-10-CM) - Cervicalgia  M54.12 (ICD-10-CM) - Radiculopathy, cervical region    RATIONALE FOR EVALUATION AND TREATMENT: Rehabilitation  THERAPY DIAG: Muscle weakness (generalized)  Unsteadiness on feet  Cervicalgia  Pain in right upper arm  ONSET DATE: Since surgical excision of bone along sternum/R rib cage - 2 years ago  FOLLOW-UP APPT SCHEDULED WITH REFERRING PROVIDER: Yes ; pt to f/u with Dr. Mariah Milling after completion of PT  PERTINENT HISTORY: Patient is an 82 year old female referred for neck pain/cervical radiculopathy with R shoulder pain referred by Filomena Jungling, MD.  Pt has husband with Parkinson's disease for whom she is primary caregiver/support person. Hx of R shoulder Fx/greater tuberosity Fx with no surgery. Pain into upper back and R shoulder worsened following breast cancer surgery. No numbness/tingling. Per referring provider, symptoms are worse with lifting/cooking/cleaning. Symptoms are better with sitting up straight. Patient reports pain affecting R upper trap and R upper arm. Hx of breast cancer on her R side; Hx of scar tissue along region of R mastectomy Pt believes that how she was laid on the table during the surgery may have affected her. Pt  reports pain affecting R upper trapezius and down to R periscapular region. Pt states it is not severe; she did not take Gabapentin - she usually takes it morning and evening. Pt denies not feel she has posterior cervical pain. Pt reports headaches at night 2-3x/week or more.   Pain:  Pain Intensity: Present: 4/10, Best: 4/10, Worst: 8/10 Pain location: R upper trapezius, R periscapular region Pain Quality: aching , "sore" Radiating: Yes , to R  upper arm Numbness/Tingling: No Focal Weakness: No Aggravating factors: moving arms around at high volume Relieving factors: sitting up straight, reclining back into couch, Gabapentin 24-hour pain behavior: None History of prior neck injury, pain, surgery, or therapy: Yes; Hx of R mastectomy and removal of some osseous tissue along sternum/rib cage; hx of R proximal humerus/greater tuberosity Fx Dominant hand: right  Imaging: Yes ;  MRI cervical spine 12/19/22 1. 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 2. Mild spinal canal stenosis at C4-C5 and C5-C6.  Electronically Signed   By: Lesia Hausen M.D.   On: 12/23/2022 08:55   Red flags (personal history of cancer, h/o spinal tumors, history of compression fracture, chills/fever, night sweats, nausea, vomiting, unrelenting pain): Positive for history of breast cancer only, s/p R mastectomy    PRECAUTIONS: None  WEIGHT BEARING RESTRICTIONS: No  FALLS: Has patient fallen in last 6 months? No  Living Environment Lives with: lives with their spouse; pt lives with son and his wife Lives in: House/apartment  Prior level of function: Independent  Occupational demands: Retired  Hobbies: Cooking  Patient Goals: Improved pain in R shoulder    OBJECTIVE:    Posture Increased thoracic kyphosis, forward head, pt rests in cervical protraction   AROM AROM (Normal range in degrees) AROM 03/08/23  Cervical  Flexion (50) 50  Extension (80) 35  Right lateral flexion (45) 22  Left lateral flexion (45) 30  Right rotation (85) 52  Left rotation (85) 47  (* = pain; Blank rows = not tested)   Shoulder AROM: Flexion 140 bilat, Abduction 160 bilat (pain with elevation of R shoulder)   MMT MMT (out of 5) Right 03/08/23 Left 03/08/23       Shoulder   Flexion 4-* 4  Extension    Abduction 4* 4+* (pain on R paracervical region)  Internal rotation 4+ 4+  Horizontal abduction    Horizontal adduction    Lower Trapezius    Rhomboids        Elbow  Flexion 4+* 4+  Extension 4+ 4+  Pronation    Supination        Wrist  Flexion 4* 4+  Extension 4+ 4  Radial deviation    Ulnar deviation        (* = pain; Blank rows = not tested)   Palpation Location LEFT  RIGHT           Suboccipitals 0 1  Cervical paraspinals 0 0  Upper Trapezius 0 2  Levator Scapulae  2  Rhomboid Major/Minor  1  (Blank rows = not tested) Graded on 0-4 scale (0 = no pain, 1 = pain, 2 = pain with wincing/grimacing/flinching, 3 = pain with withdrawal, 4 = unwilling to allow palpation), (Blank rows = not tested)  Repeated Movements Repeated retraction in supine: Pressure along suboccipital region during, no significant pain reported  in supine position afterward   Passive Accessory Intervertebral Motion Cervical sideglide; C3-6 bilateral hypomobility Cervical CPA; C5-7 decreased mobility, improved motion with C2-4 levels   SPECIAL TESTS Spurlings A (ipsilateral lateral flexion/axial compression): R: Negative L: Negative Distraction Test: Negative  Hoffman Sign (cervical cord compression): R: Negative L: Negative ULTT Median: R: Not examined L: Not examined ULTT Ulnar: R: Not examined L: Not examined ULTT Radial: R: Not examined L: Not examined     TODAY'S TREATMENT    SUBJECTIVE STATEMENT:   Pt no new C/O. Pt reports having difficulty sleeping Monday night. She was up until 5 AM the following morning. She slept fairly well last night. Patient reports not being up due to pain. Patient reports feeling "really sore" along bilateral upper traps (R worse than L); some soreness along lower paracervical region. Patient reports no significant loss of treatment effect with not doing dry needling last visit.     Manual Therapy - for symptom  modulation, soft tissue sensitivity and mobility, joint mobility, ROM   Manual cervical traction; x 10 sec on, intermittent pulls; x 5 minutes for symptom modulation  STM suboccipital mm, bilateral C3-5 splenius capitis/cervicis, R>L upper traps, R>L levator scapulae; x 12 minutes DTM/TPR R upper trapezius; x 4 minutes Cervical sideglides; C4-6, emphasis on R to L; 2 x 30 sec bouts Passive R upper trapezius stretching; 2 x 30 seconds DTM/release along R pec major  Therapeutic exercises:  Chin tucks in supine and seated 10 reps each C/S flex in supine 10 reps Scalene stretch 2 x 30 secs hold each Pectoralis stretch 10 reps 6 secs hold Chest press #2 wand 2 x 10 reps Shldr Flex #2 2 x10 res wand  Scapular retraction #2 2 x 10 reps Shldr shrugs with post roll #2 2 x 10 reps  HEP reviewed and updated and provided in H/O.        *not today* Suboccipital release with upper cervical distraction; 2 x 30 sec bouts Trigger Point Dry Needling (TDN), unbilled Education performed with patient regarding potential benefit of TDN. Reviewed precautions and risks with patient. Reviewed special precautions/risks over lung fields which include pneumothorax. Reviewed signs and symptoms of pneumothorax and advised pt to go to ER immediately if these symptoms develop advise them of dry needling treatment. Extensive time spent with pt to ensure full understanding of TDN risks. Pt provided verbal consent to treatment. TDN performed to R upper trapezius, R levator scapulae, and bilateral C6-7 splenius cervicis with 0.25 x 40 single needle placements with local twitch response (LTR). Pistoning technique utilized. Moderate soreness following intervention without significant pain.      Therapeutic Exercise - for improved soft tissue flexibility and extensibility as needed for ROM, shoulder complex mobility   Scapular clock, posterior roll; x20  -pain with retraction/tightness along R lateral pectoral  region  Cervical SNAG; x10 for R rotation  -for review  Tband row with blue Tband, upright posure; 2 x 10, 3 sec hold    PATIENT EDUCATION: HEP reviewed. Discussed general sleep hygiene guidelines and provided educational handout for sleep guidelines. Discussed following up with PCP regarding difficulty with sleeping for potential medical management.    *not today* Pec stretching, dynamic; horizontal abduction in supine with elbow flexed 90 deg; 2 sec hold; x12 Upper trapezius stretch; 2 x 30 sec   MHP (unbilled) utilized post-treatment for analgesic effect and improved soft tissue extensibility; along posterior C-spine/upper traps in prone lying; x 6 minutes    PATIENT EDUCATION:  Education details: see above for patient education details Person educated: Patient Education method: Explanation, Demonstration, and Handouts Education comprehension: verbalized understanding and returned demonstration   HOME EXERCISE PROGRAM:  Access Code: [email or web address] URL: [email or web address] Date: 03/29/2023 Prepared by: Janet Berlin  Exercises - Seated Scalene Stretch with Towel  - 1 x daily - 7 x weekly - 1 sets - 10 reps - 10secs hold - Corner Pec Major Stretch  - 1 x daily - 7 x weekly - 1 sets - 10 reps - 10sec hold - Scapular Retraction with Resistance  - 1 x daily - 7 x weekly - 3 sets - 10 reps - Standing Shoulder Shrug and Retraction with Resistance  - 1 x daily - 7 x weekly - 3 sets - 10 reps      ASSESSMENT:  CLINICAL IMPRESSION: Patient exhibits remaining stiffness with R>L C-spine rotation. She has notable soreness across upper traps and lower C-spine. Pain is not mitigated with use of traction today. We utilized DTM and TPR for upper traps and MHP post-session for soft tissue extensibility/analgesic effect. We are able to progress to low volume of periscapular isotonics without significant pain this afternoon. Pt has current remaining deficits in: decreased cervical  spine AROM, pain with loading R deltoid and pectoral region/decreased R shoulder and biceps strength, difficulty accessing overhead motion of R shoulder, postural changes, tenderness to palpation and pain along R upper trapezius/R periscapular region with moderate intermittent referral to R upper arm. Pt will benefit from skilled PT services to address deficits and improve function.   OBJECTIVE IMPAIRMENTS: decreased ROM, decreased strength, hypomobility, impaired flexibility, impaired UE functional use, postural dysfunction, and pain.   ACTIVITY LIMITATIONS: carrying, lifting, reach over head, hygiene/grooming, and caring for others  PARTICIPATION LIMITATIONS: meal prep, cleaning, driving, and community activity  PERSONAL FACTORS: Age, Past/current experiences, Time since onset of injury/illness/exacerbation, and 3+ comorbidities: hx of breast cancer,   are also affecting patient's functional outcome.   REHAB POTENTIAL: Good  CLINICAL DECISION MAKING: Evolving/moderate complexity  EVALUATION COMPLEXITY: Moderate   GOALS: Goals reviewed with patient? Yes  SHORT TERM GOALS: Target date: 03/30/2023  Pt will be independent with HEP to improve strength and decrease neck pain to improve pain-free function at home and work. Baseline: 03/08/23: Baseline HEP initiated, link to program provided via e-mail.  Goal status: INITIAL   LONG TERM GOALS: Target date: 04/29/2023  Pt will increase FOTO to at least 64 to demonstrate significant improvement in function at home and work related to neck pain  Baseline: 03/08/23: 57 Goal status: INITIAL  2.  Pt will decrease worst neck pain by at least 2 points on the NPRS in order to demonstrate clinically significant reduction in neck pain. Baseline: 03/08/23: 8/10 pain at worst Goal status: INITIAL  3.  Pt will demonstrate cervical rotation to 60 deg or greater bilat as needed for scanning environment and driving     Baseline: 62/9/52: Cervical AROM:  Rotation R 52, L 47 Goal status: INITIAL  4.  Patient will tolerate full bilateral shoulder elevation without reproduction of pain as needed for reaching, overhead object manipulation, and intermittently for self-care ADLs Baseline: 03/08/23: Shoulder flexion to 140 bilat with R shoulder/upper trap pain with overhead motion.  Goal status: INITIAL   PLAN: PT FREQUENCY: 1-2x/week  PT DURATION: 6 weeks  PLANNED INTERVENTIONS: Therapeutic exercises, Therapeutic activity, Neuromuscular re-education, Patient/Family education, Self Care, Joint mobilization, Dry Needling, Electrical stimulation, Spinal mobilization, Cryotherapy, Moist heat, Traction, Manual therapy, and Re-evaluation.  PLAN FOR NEXT SESSION: Soft tissue mobilization and manual C-spine mobilization techniques to improve ROM. Consider dry needling for R upper trap and R periscapular soft tissue. Continue with postural re-edu drills and manual therapy + exercise to improve pectoral/anterior shoulder girdle flexibility.   Janet Berlin PT DPT 5:19 PM,03/29/23

## 2023-04-02 ENCOUNTER — Ambulatory Visit: Payer: Medicare Other | Admitting: Physical Therapy

## 2023-04-02 ENCOUNTER — Encounter: Payer: Self-pay | Admitting: Physical Therapy

## 2023-04-02 DIAGNOSIS — M542 Cervicalgia: Secondary | ICD-10-CM

## 2023-04-02 DIAGNOSIS — M79621 Pain in right upper arm: Secondary | ICD-10-CM

## 2023-04-02 DIAGNOSIS — M6281 Muscle weakness (generalized): Secondary | ICD-10-CM

## 2023-04-02 DIAGNOSIS — R2681 Unsteadiness on feet: Secondary | ICD-10-CM

## 2023-04-02 NOTE — Therapy (Signed)
OUTPATIENT PHYSICAL THERAPY TREATMENT   Patient Name: Sydney Parks MRN: [id number] DOB:09/20/1940, 82 y.o., female Today's Date: 04/02/2023  END OF SESSION:  PT End of Session - 04/02/23 1021     Visit Number 7    Authorization Type Medicare    Authorization Time Period Initial eval 03/08/23    PT Start Time 1020    PT Stop Time 1100    PT Time Calculation (min) 40 min    Behavior During Therapy [facility] for tasks assessed/performed                 Past Medical History:  Diagnosis Date   Arthritis    Breast cancer (HCC) 2000 and 2016   right breast ca twice.  Mastectomy in 2016   Cataract    Diabetes mellitus without complication (HCC)    GERD (gastroesophageal reflux disease)    Hx of migraines    from ages [age]   Hypercholesteremia    Hypertension    Kidney stone on right side    Personal history of radiation therapy 2000   right breast ca   Thyroid disease    Varicose vein of leg    Past Surgical History:  Procedure Laterality Date   BREAST BIOPSY Right 2000   breast ca   BREAST BIOPSY Right 2016   invasive mammary-mastectomy   BREAST BIOPSY Right 05/27/2022   [place] RT BREAST BX W LOC DEV 1ST LESION IMG BX SPEC US GUIDE 05/27/2022 [facility]   BREAST LUMPECTOMY Right 2000   breast ca with rad tx and tamoxifen   CATARACT EXTRACTION W/ INTRAOCULAR LENS  IMPLANT, BILATERAL     MASTECTOMY Right 2016   + complete mastectomy and letrozole tx   SIMPLE MASTECTOMY WITH AXILLARY SENTINEL NODE BIOPSY Right 08/21/2014   Procedure: SIMPLE MASTECTOMY WITH AXILLARY SENTINEL NODE BIOPSY;  Surgeon: Renda Rolls, MD;  Location: [facility] ORS;  Service: General;  Laterality: Right;   TONSILLECTOMY     VARICOSE VEIN SURGERY Left    laser surgery   Patient Active Problem List   Diagnosis Date Noted   Recurrent breast cancer, right (HCC) 06/01/2022   Chronic venous insufficiency 09/17/2021   Lymphedema 09/17/2021   Aortic insufficiency 10/05/2018   PVC's (premature  ventricular contractions) 09/16/2018   History of right breast cancer 07/19/2018   Gout 07/05/2017   Malignant neoplasm of breast (HCC) 03/06/2015   Type 2 diabetes mellitus (HCC) 03/06/2015   Hypercholesterolemia 03/06/2015   S/P mastectomy 08/21/2014   Breast cancer (HCC) 08/21/2014   Arthritis 08/08/2014   Diabetes mellitus, type 2 (HCC) 08/08/2014   Hypercholesteremia 08/08/2014   Hypertension 08/08/2014   Hypothyroidism 08/08/2014   Osteopenia 04/06/2001    PCP: Myrene Buddy, NP  REFERRING PROVIDER: Elijah Birk, MD  REFERRING DIAG:  M54.2 (ICD-10-CM) - Cervicalgia  M54.12 (ICD-10-CM) - Radiculopathy, cervical region    RATIONALE FOR EVALUATION AND TREATMENT: Rehabilitation  THERAPY DIAG: Muscle weakness (generalized)  Unsteadiness on feet  Cervicalgia  Pain in right upper arm  ONSET DATE: Since surgical excision of bone along sternum/R rib cage - 2 years ago  FOLLOW-UP APPT SCHEDULED WITH REFERRING PROVIDER: Yes ; pt to f/u with Dr. Mariah Milling after completion of PT  PERTINENT HISTORY: Patient is an 82 year old female referred for neck pain/cervical radiculopathy with R shoulder pain referred by Filomena Jungling, MD.  Pt has husband with Parkinson's disease for whom she is primary caregiver/support person. Hx of R shoulder Fx/greater tuberosity Fx with no surgery. Pain into  upper back and R shoulder worsened following breast cancer surgery. No numbness/tingling. Per referring provider, symptoms are worse with lifting/cooking/cleaning. Symptoms are better with sitting up straight. Patient reports pain affecting R upper trap and R upper arm. Hx of breast cancer on her R side; Hx of scar tissue along region of R mastectomy Pt believes that how she was laid on the table during the surgery may have affected her. Pt reports pain affecting R upper trapezius and down to R periscapular region. Pt states it is not severe; she did not take Gabapentin - she usually  takes it morning and evening. Pt denies not feel she has posterior cervical pain. Pt reports headaches at night 2-3x/week or more.   Pain:  Pain Intensity: Present: 4/10, Best: 4/10, Worst: 8/10 Pain location: R upper trapezius, R periscapular region Pain Quality: aching , "sore" Radiating: Yes , to R upper arm Numbness/Tingling: No Focal Weakness: No Aggravating factors: moving arms around at high volume Relieving factors: sitting up straight, reclining back into couch, Gabapentin 24-hour pain behavior: None History of prior neck injury, pain, surgery, or therapy: Yes; Hx of R mastectomy and removal of some osseous tissue along sternum/rib cage; hx of R proximal humerus/greater tuberosity Fx Dominant hand: right  Imaging: Yes ;  MRI cervical spine 12/19/22 1. 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 2. Mild spinal canal stenosis at C4-C5 and C5-C6.  Electronically Signed   By: Lesia Hausen M.D.   On: 12/23/2022 08:55   Red flags (personal history of cancer, h/o spinal tumors, history of compression fracture, chills/fever, night sweats, nausea, vomiting, unrelenting pain): Positive for history of breast cancer only, s/p R mastectomy    PRECAUTIONS: None  WEIGHT BEARING RESTRICTIONS: No  FALLS: Has patient fallen in last 6 months? No  Living Environment Lives with: lives with their spouse; pt lives with son and his wife Lives in: House/apartment  Prior level of function: Independent  Occupational demands: Retired  Hobbies: Cooking  Patient Goals: Improved pain in R shoulder    OBJECTIVE:    Posture Increased thoracic kyphosis, forward head, pt rests in cervical protraction   AROM AROM (Normal range in degrees) AROM 03/08/23  Cervical  Flexion (50) 50  Extension (80) 35   Right lateral flexion (45) 22  Left lateral flexion (45) 30  Right rotation (85) 52  Left rotation (85) 47  (* = pain; Blank rows = not tested)   Shoulder AROM: Flexion 140 bilat, Abduction 160 bilat (pain with elevation of R shoulder)   MMT MMT (out of 5) Right 03/08/23 Left 03/08/23      Shoulder   Flexion 4-* 4  Extension    Abduction 4* 4+* (pain on R paracervical region)  Internal rotation 4+ 4+  Horizontal abduction    Horizontal adduction    Lower Trapezius    Rhomboids        Elbow  Flexion 4+* 4+  Extension 4+ 4+  Pronation    Supination        Wrist  Flexion 4* 4+  Extension 4+ 4  Radial deviation    Ulnar deviation        (* = pain; Blank rows = not tested)   Palpation Location LEFT  RIGHT           Suboccipitals 0 1  Cervical paraspinals 0 0  Upper Trapezius 0 2  Levator Scapulae  2  Rhomboid Major/Minor  1  (Blank rows = not tested) Graded on 0-4 scale (0 = no pain, 1 = pain, 2 = pain with wincing/grimacing/flinching, 3 = pain with withdrawal, 4 = unwilling to allow palpation), (Blank rows = not tested)  Repeated Movements Repeated retraction in supine: Pressure along suboccipital region during, no significant pain reported in supine position afterward   Passive Accessory Intervertebral Motion Cervical sideglide; C3-6 bilateral hypomobility Cervical CPA; C5-7 decreased mobility, improved motion with C2-4 levels   SPECIAL TESTS Spurlings A (ipsilateral lateral flexion/axial compression): R: Negative L: Negative Distraction Test: Negative  Hoffman Sign (cervical cord compression): R: Negative L: Negative ULTT Median: R: Not examined L: Not examined ULTT Ulnar: R: Not examined L: Not examined ULTT Radial: R: Not examined L: Not examined     TODAY'S TREATMENT    SUBJECTIVE STATEMENT:   Pt reports feeling sore along R>L upper trap region. She reports no achy pain at this time. She reports notable soreness after last visit.      Manual Therapy - for symptom modulation, soft tissue sensitivity and mobility, joint mobility, ROM   Suboccipital release with upper cervical distraction; 3 x 30 sec bouts STM suboccipital mm, bilateral C3-5 splenius capitis/cervicis, R>L upper traps, R>L levator scapulae; x 8 minutes DTM/TPR R upper trapezius; x 6 minutes Cervical sideglides; C4-6, emphasis on R to L; 2 x 30 sec bouts DTM/release along R pec major; x 3 minutes  *not today* Passive R upper trapezius stretching; 2 x 30 seconds Manual cervical traction; x 10 sec on, intermittent pulls; x 5 minutes for symptom modulation     Therapeutic Exercise - for improved soft tissue flexibility and extensibility as needed for ROM, shoulder complex mobility  Shldr Flex;  2 x10 res wand  Pec stretching, dynamic; horizontal abduction in supine with elbow flexed 90 deg; 2 sec hold; x12 Tband row with blue Tband, upright posure; 2 x 10, 3 sec hold   -tactile cueing and verbal cueing for scapular retraction and depression    *not today* Trigger Point Dry Needling (TDN), unbilled Education performed with patient regarding potential benefit of TDN. Reviewed precautions and risks with patient. Reviewed special precautions/risks over lung fields which include pneumothorax. Reviewed signs and symptoms of pneumothorax and advised pt to go to ER immediately if these symptoms develop advise them of dry needling treatment. Extensive time spent with pt to ensure full understanding of TDN risks. Pt provided verbal consent to treatment. TDN performed to R upper trapezius, R levator scapulae, and bilateral C6-7 splenius cervicis with 0.25 x 40 single needle placements with local twitch response (LTR). Pistoning technique utilized. Moderate soreness following intervention without significant pain.    *not today* Cervical SNAG; x10 for R rotation Upper trapezius stretch; 2 x 30 sec Scapular clock, posterior roll; x20  -pain with  retraction/tightness along R lateral pectoral region MHP (unbilled) utilized post-treatment for analgesic effect and improved soft tissue extensibility; along posterior C-spine/upper traps in prone lying; x 6 minutes    PATIENT EDUCATION:  Education details: see above for patient education details Person educated: Patient Education method: Explanation, Demonstration, and Handouts Education comprehension: verbalized understanding and returned demonstration   HOME EXERCISE PROGRAM:  Access Code: [email or web address] URL: [email or web address] Date: 03/29/2023 Prepared by: Janet Berlin  Exercises - Seated [place]ene Stretch with Towel  - 1 x daily - 7 x weekly - 1 sets - 10 reps - 10secs  hold - Corner Pec Major Stretch  - 1 x daily - 7 x weekly - 1 sets - 10 reps - 10sec hold - Scapular Retraction with Resistance  - 1 x daily - 7 x weekly - 3 sets - 10 reps - Standing Shoulder Shrug and Retraction with Resistance  - 1 x daily - 7 x weekly - 3 sets - 10 reps      ASSESSMENT:  CLINICAL IMPRESSION: Patient exhibits remaining stiffness with R>L C-spine rotation. She has notable soreness across upper traps and lower C-spine. Pain is not mitigated with use of traction today. We utilized DTM and TPR for upper traps and MHP post-session for soft tissue extensibility/analgesic effect. We are able to progress to low volume of periscapular isotonics without significant pain this afternoon. Pt has current remaining deficits in: decreased cervical spine AROM, pain with loading R deltoid and pectoral region/decreased R shoulder and biceps strength, difficulty accessing overhead motion of R shoulder, postural changes, tenderness to palpation and pain along R upper trapezius/R periscapular region with moderate intermittent referral to R upper arm. Pt will benefit from skilled PT services to address deficits and improve function.   OBJECTIVE IMPAIRMENTS: decreased ROM, decreased strength, hypomobility,  impaired flexibility, impaired UE functional use, postural dysfunction, and pain.   ACTIVITY LIMITATIONS: carrying, lifting, reach over head, hygiene/grooming, and caring for others  PARTICIPATION LIMITATIONS: meal prep, cleaning, driving, and community activity  PERSONAL FACTORS: Age, Past/current experiences, Time since onset of injury/illness/exacerbation, and 3+ comorbidities: hx of breast cancer,   are also affecting patient's functional outcome.   REHAB POTENTIAL: Good  CLINICAL DECISION MAKING: Evolving/moderate complexity  EVALUATION COMPLEXITY: Moderate   GOALS: Goals reviewed with patient? Yes  SHORT TERM GOALS: Target date: 03/30/2023  Pt will be independent with HEP to improve strength and decrease neck pain to improve pain-free function at home and work. Baseline: 03/08/23: Baseline HEP initiated, link to program provided via e-mail.  Goal status: INITIAL   LONG TERM GOALS: Target date: 04/29/2023  Pt will increase FOTO to at least 64 to demonstrate significant improvement in function at home and work related to neck pain  Baseline: 03/08/23: 57 Goal status: INITIAL  2.  Pt will decrease worst neck pain by at least 2 points on the NPRS in order to demonstrate clinically significant reduction in neck pain. Baseline: 03/08/23: 8/10 pain at worst Goal status: INITIAL  3.  Pt will demonstrate cervical rotation to 60 deg or greater bilat as needed for scanning environment and driving     Baseline: 16/1/09: Cervical AROM: Rotation R 52, L 47 Goal status: INITIAL  4.  Patient will tolerate full bilateral shoulder elevation without reproduction of pain as needed for reaching, overhead object manipulation, and intermittently for self-care ADLs Baseline: 03/08/23: Shoulder flexion to 140 bilat with R shoulder/upper trap pain with overhead motion.  Goal status: INITIAL   PLAN: PT FREQUENCY: 1-2x/week  PT DURATION: 6 weeks  PLANNED INTERVENTIONS: Therapeutic exercises,  Therapeutic activity, Neuromuscular re-education, Patient/Family education, Self Care, Joint mobilization, Dry Needling, Electrical stimulation, Spinal mobilization, Cryotherapy, Moist heat, Traction, Manual therapy, and Re-evaluation.  PLAN FOR NEXT SESSION: Soft tissue mobilization and manual C-spine mobilization techniques to improve ROM. Consider dry needling for R upper trap and R periscapular soft tissue. Continue with postural re-edu drills and manual therapy + exercise to improve pectoral/anterior shoulder girdle flexibility.    THIS NOTE IS INCOMPLETE, PLEASE DO NOT REFERENCE FOR INFORMATION  Consuela Mimes, PT, DPT [phone] 10:22 AM,04/02/23

## 2023-04-05 ENCOUNTER — Encounter: Payer: Self-pay | Admitting: Physical Therapy

## 2023-04-05 ENCOUNTER — Ambulatory Visit: Payer: Medicare Other | Admitting: Physical Therapy

## 2023-04-05 DIAGNOSIS — M542 Cervicalgia: Secondary | ICD-10-CM | POA: Diagnosis not present

## 2023-04-05 DIAGNOSIS — M6281 Muscle weakness (generalized): Secondary | ICD-10-CM

## 2023-04-05 DIAGNOSIS — R2681 Unsteadiness on feet: Secondary | ICD-10-CM

## 2023-04-05 DIAGNOSIS — M79621 Pain in right upper arm: Secondary | ICD-10-CM

## 2023-04-05 NOTE — Therapy (Signed)
OUTPATIENT PHYSICAL THERAPY TREATMENT   Patient Name: Sydney Parks MRN: [id number] DOB:08-Feb-1941, 82 y.o., female Today's Date: 04/05/2023  END OF SESSION:  PT End of Session - 04/05/23 1232     Visit Number 8    Authorization Type Medicare    Authorization Time Period Initial eval 03/08/23    PT Start Time 1240    PT Stop Time 1329    PT Time Calculation (min) 49 min    Behavior During Therapy [facility] for tasks assessed/performed              Past Medical History:  Diagnosis Date   Arthritis    Breast cancer (HCC) 2000 and 2016   right breast ca twice.  Mastectomy in 2016   Cataract    Diabetes mellitus without complication (HCC)    GERD (gastroesophageal reflux disease)    Hx of migraines    from ages [age]   Hypercholesteremia    Hypertension    Kidney stone on right side    Personal history of radiation therapy 2000   right breast ca   Thyroid disease    Varicose vein of leg    Past Surgical History:  Procedure Laterality Date   BREAST BIOPSY Right 2000   breast ca   BREAST BIOPSY Right 2016   invasive mammary-mastectomy   BREAST BIOPSY Right 05/27/2022   [place] RT BREAST BX W LOC DEV 1ST LESION IMG BX SPEC US GUIDE 05/27/2022 [facility]   BREAST LUMPECTOMY Right 2000   breast ca with rad tx and tamoxifen   CATARACT EXTRACTION W/ INTRAOCULAR LENS  IMPLANT, BILATERAL     MASTECTOMY Right 2016   + complete mastectomy and letrozole tx   SIMPLE MASTECTOMY WITH AXILLARY SENTINEL NODE BIOPSY Right 08/21/2014   Procedure: SIMPLE MASTECTOMY WITH AXILLARY SENTINEL NODE BIOPSY;  Surgeon: Renda Rolls, MD;  Location: [facility] ORS;  Service: General;  Laterality: Right;   TONSILLECTOMY     VARICOSE VEIN SURGERY Left    laser surgery   Patient Active Problem List   Diagnosis Date Noted   Recurrent breast cancer, right (HCC) 06/01/2022   Chronic venous insufficiency 09/17/2021   Lymphedema 09/17/2021   Aortic insufficiency 10/05/2018   PVC's (premature ventricular  contractions) 09/16/2018   History of right breast cancer 07/19/2018   Gout 07/05/2017   Malignant neoplasm of breast (HCC) 03/06/2015   Type 2 diabetes mellitus (HCC) 03/06/2015   Hypercholesterolemia 03/06/2015   S/P mastectomy 08/21/2014   Breast cancer (HCC) 08/21/2014   Arthritis 08/08/2014   Diabetes mellitus, type 2 (HCC) 08/08/2014   Hypercholesteremia 08/08/2014   Hypertension 08/08/2014   Hypothyroidism 08/08/2014   Osteopenia 04/06/2001    PCP: Myrene Buddy, NP  REFERRING PROVIDER: Elijah Birk, MD  REFERRING DIAG:  M54.2 (ICD-10-CM) - Cervicalgia  M54.12 (ICD-10-CM) - Radiculopathy, cervical region    RATIONALE FOR EVALUATION AND TREATMENT: Rehabilitation  THERAPY DIAG: Muscle weakness (generalized)  Unsteadiness on feet  Cervicalgia  Pain in right upper arm  ONSET DATE: Since surgical excision of bone along sternum/R rib cage - 2 years ago  FOLLOW-UP APPT SCHEDULED WITH REFERRING PROVIDER: Yes ; pt to f/u with Dr. Mariah Milling after completion of PT  PERTINENT HISTORY: Patient is an 82 year old female referred for neck pain/cervical radiculopathy with R shoulder pain referred by Filomena Jungling, MD.  Pt has husband with Parkinson's disease for whom she is primary caregiver/support person. Hx of R shoulder Fx/greater tuberosity Fx with no surgery. Pain into upper back and  R shoulder worsened following breast cancer surgery. No numbness/tingling. Per referring provider, symptoms are worse with lifting/cooking/cleaning. Symptoms are better with sitting up straight. Patient reports pain affecting R upper trap and R upper arm. Hx of breast cancer on her R side; Hx of scar tissue along region of R mastectomy Pt believes that how she was laid on the table during the surgery may have affected her. Pt reports pain affecting R upper trapezius and down to R periscapular region. Pt states it is not severe; she did not take Gabapentin - she usually takes it morning  and evening. Pt denies not feel she has posterior cervical pain. Pt reports headaches at night 2-3x/week or more.   Pain:  Pain Intensity: Present: 4/10, Best: 4/10, Worst: 8/10 Pain location: R upper trapezius, R periscapular region Pain Quality: aching , "sore" Radiating: Yes , to R upper arm Numbness/Tingling: No Focal Weakness: No Aggravating factors: moving arms around at high volume Relieving factors: sitting up straight, reclining back into couch, Gabapentin 24-hour pain behavior: None History of prior neck injury, pain, surgery, or therapy: Yes; Hx of R mastectomy and removal of some osseous tissue along sternum/rib cage; hx of R proximal humerus/greater tuberosity Fx Dominant hand: right  Imaging: Yes ;  MRI cervical spine 12/19/22 1. 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 2. Mild spinal canal stenosis at C4-C5 and C5-C6.  Electronically Signed   By: Lesia Hausen M.D.   On: 12/23/2022 08:55   Red flags (personal history of cancer, h/o spinal tumors, history of compression fracture, chills/fever, night sweats, nausea, vomiting, unrelenting pain): Positive for history of breast cancer only, s/p R mastectomy    PRECAUTIONS: None  WEIGHT BEARING RESTRICTIONS: No  FALLS: Has patient fallen in last 6 months? No  Living Environment Lives with: lives with their spouse; pt lives with son and his wife Lives in: House/apartment  Prior level of function: Independent  Occupational demands: Retired  Hobbies: Cooking  Patient Goals: Improved pain in R shoulder    OBJECTIVE:    Posture Increased thoracic kyphosis, forward head, pt rests in cervical protraction   AROM AROM (Normal range in degrees) AROM 03/08/23  Cervical  Flexion (50) 50  Extension (80) 35  Right lateral  flexion (45) 22  Left lateral flexion (45) 30  Right rotation (85) 52  Left rotation (85) 47  (* = pain; Blank rows = not tested)   Shoulder AROM: Flexion 140 bilat, Abduction 160 bilat (pain with elevation of R shoulder)   MMT MMT (out of 5) Right 03/08/23 Left 03/08/23      Shoulder   Flexion 4-* 4  Extension    Abduction 4* 4+* (pain on R paracervical region)  Internal rotation 4+ 4+  Horizontal abduction    Horizontal adduction    Lower Trapezius    Rhomboids        Elbow  Flexion 4+* 4+  Extension 4+ 4+  Pronation    Supination        Wrist  Flexion 4* 4+  Extension 4+ 4  Radial deviation    Ulnar deviation        (* = pain; Blank rows = not tested)   Palpation Location LEFT  RIGHT           Suboccipitals 0 1  Cervical paraspinals 0  0  Upper Trapezius 0 2  Levator Scapulae  2  Rhomboid Major/Minor  1  (Blank rows = not tested) Graded on 0-4 scale (0 = no pain, 1 = pain, 2 = pain with wincing/grimacing/flinching, 3 = pain with withdrawal, 4 = unwilling to allow palpation), (Blank rows = not tested)  Repeated Movements Repeated retraction in supine: Pressure along suboccipital region during, no significant pain reported in supine position afterward   Passive Accessory Intervertebral Motion Cervical sideglide; C3-6 bilateral hypomobility Cervical CPA; C5-7 decreased mobility, improved motion with C2-4 levels   SPECIAL TESTS Spurlings A (ipsilateral lateral flexion/axial compression): R: Negative L: Negative Distraction Test: Negative  Hoffman Sign (cervical cord compression): R: Negative L: Negative ULTT Median: R: Not examined L: Not examined ULTT Ulnar: R: Not examined L: Not examined ULTT Radial: R: Not examined L: Not examined     TODAY'S TREATMENT    SUBJECTIVE STATEMENT:   Pt reports more low back pain after cleaning her kitchen last night. Patient reports ongoing soreness affecting R upper trapezius region. Patient reports she has not  completed new stretches given by Janet Berlin yet. Patient reports no major complaints after PT last visit.     Manual Therapy - for symptom modulation, soft tissue sensitivity and mobility, joint mobility, ROM   Manual cervical traction; x 10 sec on, intermittent pulls; x 5 minutes for symptom modulation STM suboccipital mm, bilateral C3-5 splenius capitis/cervicis, R>L upper traps, R>L levator scapulae; x 10 minutes DTM/TPR R upper trapezius; x 6 minutes Cervical sideglides; C4-6, emphasis on R to L; 2 x 30 sec bouts  MET for R cervical spine rotation; antagonist contraction for 5 sec followed by AROM; x5 reps  C-spine AROM: R rotation 72 deg, L rotation 61 deg    *not today* DTM/release along R pec major with manual gentle stretch into horizontal abduction; x 3 minutes Passive R upper trapezius stretching; 2 x 30 seconds Suboccipital release with upper cervical distraction; 3 x 30 sec bouts    Therapeutic Exercise - for improved soft tissue flexibility and extensibility as needed for ROM, shoulder complex mobility  Bruegger's (seated W with Tband, with isometric hold) ; x10 sec hold, 10 reps; yellow Tband  Shldr Flex AAROM, in standing with back to wall;  2 x10 with wand  Tband row with blue Tband, upright posure; 2 x 12, 3 sec hold   -tactile cueing and verbal cueing for scapular retraction and depression    *not today* Pec stretching, dynamic; horizontal abduction in supine with elbow flexed 90 deg; 2 sec hold; x12 Trigger Point Dry Needling (TDN), unbilled Education performed with patient regarding potential benefit of TDN. Reviewed precautions and risks with patient. Reviewed special precautions/risks over lung fields which include pneumothorax. Reviewed signs and symptoms of pneumothorax and advised pt to go to ER immediately if these symptoms develop advise them of dry needling treatment. Extensive time spent with pt to ensure full understanding of TDN risks. Pt provided  verbal consent to treatment. TDN performed to R upper trapezius, R levator scapulae, and bilateral C6-7 splenius cervicis with 0.25 x 40 single needle placements with local twitch response (LTR). Pistoning technique utilized. Moderate soreness following intervention without significant pain.    *not today* Cervical SNAG; x10 for R rotation Upper trapezius stretch; 2 x 30 sec Scapular clock, posterior roll; x20  -pain with retraction/tightness along R lateral pectoral region MHP (unbilled) utilized post-treatment for analgesic effect and improved soft tissue extensibility; along posterior C-spine/upper traps in prone  lying; x 6 minutes    PATIENT EDUCATION:  Education details: see above for patient education details Person educated: Patient Education method: Explanation, Demonstration, and Handouts Education comprehension: verbalized understanding and returned demonstration   HOME EXERCISE PROGRAM:  Access Code: [email or web address] URL: [email or web address] Date: 03/29/2023 Prepared by: Janet Berlin  Exercises - Seated [place] Stretch with Towel  - 1 x daily - 7 x weekly - 1 sets - 10 reps - 10secs hold - Corner Pec Major Stretch  - 1 x daily - 7 x weekly - 1 sets - 10 reps - 10sec hold - Scapular Retraction with Resistance  - 1 x daily - 7 x weekly - 3 sets - 10 reps - Standing Shoulder Shrug and Retraction with Resistance  - 1 x daily - 7 x weekly - 3 sets - 10 reps      ASSESSMENT:  CLINICAL IMPRESSION: Patient demonstrates improving ROM overall; she has functional ROM for both R and L rotation. She has some discomfort remaining with R upper trapezius pain with end-range cervical flexion. Pt has responded fairly well with DTM/TPR in this region, but continuous soreness is reported with successive visits. Pt fortunately feels that periscapular/thoracic pain has not been as apparent. We continued progression of postural re-education/scapular retraction-depression drills today  - pt tolerates these well. Pt has current remaining deficits in: decreased cervical spine AROM, pain with loading R deltoid and pectoral region/decreased R shoulder and biceps strength, difficulty accessing overhead motion of R shoulder, postural changes, tenderness to palpation and pain along R upper trapezius/R periscapular region with moderate intermittent referral to R upper arm. Pt will benefit from skilled PT services to address deficits and improve function.   OBJECTIVE IMPAIRMENTS: decreased ROM, decreased strength, hypomobility, impaired flexibility, impaired UE functional use, postural dysfunction, and pain.   ACTIVITY LIMITATIONS: carrying, lifting, reach over head, hygiene/grooming, and caring for others  PARTICIPATION LIMITATIONS: meal prep, cleaning, driving, and community activity  PERSONAL FACTORS: Age, Past/current experiences, Time since onset of injury/illness/exacerbation, and 3+ comorbidities: hx of breast cancer,   are also affecting patient's functional outcome.   REHAB POTENTIAL: Good  CLINICAL DECISION MAKING: Evolving/moderate complexity  EVALUATION COMPLEXITY: Moderate   GOALS: Goals reviewed with patient? Yes  SHORT TERM GOALS: Target date: 03/30/2023  Pt will be independent with HEP to improve strength and decrease neck pain to improve pain-free function at home and work. Baseline: 03/08/23: Baseline HEP initiated, link to program provided via e-mail.  Goal status: INITIAL   LONG TERM GOALS: Target date: 04/29/2023  Pt will increase FOTO to at least 64 to demonstrate significant improvement in function at home and work related to neck pain  Baseline: 03/08/23: 57 Goal status: INITIAL  2.  Pt will decrease worst neck pain by at least 2 points on the NPRS in order to demonstrate clinically significant reduction in neck pain. Baseline: 03/08/23: 8/10 pain at worst Goal status: INITIAL  3.  Pt will demonstrate cervical rotation to 60 deg or greater bilat as  needed for scanning environment and driving     Baseline: 16/1/09: Cervical AROM: Rotation R 52, L 47 Goal status: INITIAL  4.  Patient will tolerate full bilateral shoulder elevation without reproduction of pain as needed for reaching, overhead object manipulation, and intermittently for self-care ADLs Baseline: 03/08/23: Shoulder flexion to 140 bilat with R shoulder/upper trap pain with overhead motion.  Goal status: INITIAL   PLAN: PT FREQUENCY: 1-2x/week  PT DURATION: 6 weeks  PLANNED INTERVENTIONS: Therapeutic exercises, Therapeutic activity,  Neuromuscular re-education, Patient/Family education, Self Care, Joint mobilization, Dry Needling, Electrical stimulation, Spinal mobilization, Cryotherapy, Moist heat, Traction, Manual therapy, and Re-evaluation.  PLAN FOR NEXT SESSION: Soft tissue mobilization and manual C-spine mobilization techniques to improve ROM. Consider dry needling for R upper trap and R periscapular soft tissue. Continue with postural re-edu drills and manual therapy + exercise to improve pectoral/anterior shoulder girdle flexibility.     Consuela Mimes, PT, DPT [phone] 3:29 PM,04/05/23

## 2023-04-08 NOTE — Therapy (Deleted)
 OUTPATIENT PHYSICAL THERAPY TREATMENT   Patient Name: Sydney Parks MRN: [id number] DOB:1941-02-25, 83 y.o., female Today's Date: 04/08/2023  END OF SESSION:     Past Medical History:  Diagnosis Date   Arthritis    Breast cancer (HCC) 2000 and 2016   right breast ca twice.  Mastectomy in 2016   Cataract    Diabetes mellitus without complication (HCC)    GERD (gastroesophageal reflux disease)    Hx of migraines    from ages [age]   Hypercholesteremia    Hypertension    Kidney stone on right side    Personal history of radiation therapy 2000   right breast ca   Thyroid disease    Varicose vein of leg    Past Surgical History:  Procedure Laterality Date   BREAST BIOPSY Right 2000   breast ca   BREAST BIOPSY Right 2016   invasive mammary-mastectomy   BREAST BIOPSY Right 05/27/2022   US  RT BREAST BX W LOC DEV 1ST LESION IMG BX SPEC US  GUIDE 05/27/2022 [facility]   BREAST LUMPECTOMY Right 2000   breast ca with rad tx and tamoxifen   CATARACT EXTRACTION W/ INTRAOCULAR LENS  IMPLANT, BILATERAL     MASTECTOMY Right 2016   + complete mastectomy and letrozole  tx   SIMPLE MASTECTOMY WITH AXILLARY SENTINEL NODE BIOPSY Right 08/21/2014   Procedure: SIMPLE MASTECTOMY WITH AXILLARY SENTINEL NODE BIOPSY;  Surgeon: Unknown Sharps, MD;  Location: [facility];  Service: General;  Laterality: Right;   TONSILLECTOMY     VARICOSE VEIN SURGERY Left    laser surgery   Patient Active Problem List   Diagnosis Date Noted   Recurrent breast cancer, right (HCC) 06/01/2022   Chronic venous insufficiency 09/17/2021   Lymphedema 09/17/2021   Aortic insufficiency 10/05/2018   PVC's (premature ventricular contractions) 09/16/2018   History of right breast cancer 07/19/2018   Gout 07/05/2017   Malignant neoplasm of breast (HCC) 03/06/2015   Type 2 diabetes mellitus (HCC) 03/06/2015   Hypercholesterolemia 03/06/2015   S/P mastectomy 08/21/2014   Breast cancer (HCC) 08/21/2014   Arthritis  08/08/2014   Diabetes mellitus, type 2 (HCC) 08/08/2014   Hypercholesteremia 08/08/2014   Hypertension 08/08/2014   Hypothyroidism 08/08/2014   Osteopenia 04/06/2001    PCP: Don Lauraine Collar, NP  REFERRING PROVIDER: Dodson Delon FERNS, MD  REFERRING DIAG:  M54.2 (ICD-10-CM) - Cervicalgia  M54.12 (ICD-10-CM) - Radiculopathy, cervical region    RATIONALE FOR EVALUATION AND TREATMENT: Rehabilitation  THERAPY DIAG: Muscle weakness (generalized)  Unsteadiness on feet  Cervicalgia  Pain in right upper arm  ONSET DATE: Since surgical excision of bone along sternum/R rib cage - 2 years ago  FOLLOW-UP APPT SCHEDULED WITH REFERRING PROVIDER: Yes ; pt to f/u with Dr. Dodson after completion of PT  PERTINENT HISTORY: Patient is an 83 year old female referred for neck pain/cervical radiculopathy with R shoulder pain referred by Delon Dodson, MD.  Pt has husband with Parkinson's disease for whom she is primary caregiver/support person. Hx of R shoulder Fx/greater tuberosity Fx with no surgery. Pain into upper back and R shoulder worsened following breast cancer surgery. No numbness/tingling. Per referring provider, symptoms are worse with lifting/cooking/cleaning. Symptoms are better with sitting up straight. Patient reports pain affecting R upper trap and R upper arm. Hx of breast cancer on her R side; Hx of scar tissue along region of R mastectomy Pt believes that how she was laid on the table during the surgery may have affected her. Pt reports pain  affecting R upper trapezius and down to R periscapular region. Pt states it is not severe; she did not take Gabapentin - she usually takes it morning and evening. Pt denies not feel she has posterior cervical pain. Pt reports headaches at night 2-3x/week or more.   Pain:  Pain Intensity: Present: 4/10, Best: 4/10, Worst: 8/10 Pain location: R upper trapezius, R periscapular region Pain Quality: aching , sore Radiating: Yes , to R  upper arm Numbness/Tingling: No Focal Weakness: No Aggravating factors: moving arms around at high volume Relieving factors: sitting up straight, reclining back into couch, Gabapentin 24-hour pain behavior: None History of prior neck injury, pain, surgery, or therapy: Yes; Hx of R mastectomy and removal of some osseous tissue along sternum/rib cage; hx of R proximal humerus/greater tuberosity Fx Dominant hand: right  Imaging: Yes ;  MRI cervical spine 12/19/22 1. 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 2. Mild spinal canal stenosis at C4-C5 and C5-C6.  Electronically Signed   By: Maude Harry M.D.   On: 12/23/2022 08:55   Red flags (personal history of cancer, h/o spinal tumors, history of compression fracture, chills/fever, night sweats, nausea, vomiting, unrelenting pain): Positive for history of breast cancer only, s/p R mastectomy    PRECAUTIONS: None  WEIGHT BEARING RESTRICTIONS: No  FALLS: Has patient fallen in last 6 months? No  Living Environment Lives with: lives with their spouse; pt lives with son and his wife Lives in: House/apartment  Prior level of function: Independent  Occupational demands: Retired  Hobbies: Cooking  Patient Goals: Improved pain in R shoulder    OBJECTIVE:    Posture Increased thoracic kyphosis, forward head, pt rests in cervical protraction   AROM AROM (Normal range in degrees) AROM 03/08/23  Cervical  Flexion (50) 50  Extension (80) 35  Right lateral flexion (45) 22  Left lateral flexion (45) 30  Right rotation (85) 52  Left rotation (85) 47  (* = pain; Blank rows = not tested)   Shoulder AROM: Flexion 140 bilat, Abduction 160 bilat (pain with elevation of R shoulder)   MMT MMT (out of 5) Right 03/08/23 Left 03/08/23       Shoulder   Flexion 4-* 4  Extension    Abduction 4* 4+* (pain on R paracervical region)  Internal rotation 4+ 4+  Horizontal abduction    Horizontal adduction    Lower Trapezius    Rhomboids        Elbow  Flexion 4+* 4+  Extension 4+ 4+  Pronation    Supination        Wrist  Flexion 4* 4+  Extension 4+ 4  Radial deviation    Ulnar deviation        (* = pain; Blank rows = not tested)   Palpation Location LEFT  RIGHT           Suboccipitals 0 1  Cervical paraspinals 0 0  Upper Trapezius 0 2  Levator Scapulae  2  Rhomboid Major/Minor  1  (Blank rows = not tested) Graded on 0-4 scale (0 = no pain, 1 = pain, 2 = pain with wincing/grimacing/flinching, 3 = pain with withdrawal, 4 = unwilling to allow palpation), (Blank rows = not tested)  Repeated Movements Repeated retraction in supine: Pressure along suboccipital region during, no significant pain reported in supine  position afterward   Passive Accessory Intervertebral Motion Cervical sideglide; C3-6 bilateral hypomobility Cervical CPA; C5-7 decreased mobility, improved motion with C2-4 levels   SPECIAL TESTS Spurlings A (ipsilateral lateral flexion/axial compression): R: Negative L: Negative Distraction Test: Negative  Hoffman Sign (cervical cord compression): R: Negative L: Negative ULTT Median: R: Not examined L: Not examined ULTT Ulnar: R: Not examined L: Not examined ULTT Radial: R: Not examined L: Not examined     TODAY'S TREATMENT    SUBJECTIVE STATEMENT:   Pt reports more low back pain after cleaning her kitchen last night. Patient reports ongoing soreness affecting R upper trapezius region. Patient reports she has not completed new stretches given by Ronita Oris yet. Patient reports no major complaints after PT last visit.     Manual Therapy - for symptom modulation, soft tissue sensitivity and mobility, joint mobility, ROM   Manual cervical traction; x 10 sec on, intermittent pulls; x 5 minutes  for symptom modulation STM suboccipital mm, bilateral C3-5 splenius capitis/cervicis, R>L upper traps, R>L levator scapulae; x 10 minutes DTM/TPR R upper trapezius; x 6 minutes Cervical sideglides; C4-6, emphasis on R to L; 2 x 30 sec bouts  MET for R cervical spine rotation; antagonist contraction for 5 sec followed by AROM; x5 reps  C-spine AROM: R rotation 72 deg, L rotation 61 deg    *not today* DTM/release along R pec major with manual gentle stretch into horizontal abduction; x 3 minutes Passive R upper trapezius stretching; 2 x 30 seconds Suboccipital release with upper cervical distraction; 3 x 30 sec bouts    Therapeutic Exercise - for improved soft tissue flexibility and extensibility as needed for ROM, shoulder complex mobility  Bruegger's (seated W with Tband, with isometric hold) ; x10 sec hold, 10 reps; yellow Tband  Shldr Flex AAROM, in standing with back to wall;  2 x10 with wand  Tband row with blue Tband, upright posure; 2 x 12, 3 sec hold   -tactile cueing and verbal cueing for scapular retraction and depression    *not today* Pec stretching, dynamic; horizontal abduction in supine with elbow flexed 90 deg; 2 sec hold; x12 Trigger Point Dry Needling (TDN), unbilled Education performed with patient regarding potential benefit of TDN. Reviewed precautions and risks with patient. Reviewed special precautions/risks over lung fields which include pneumothorax. Reviewed signs and symptoms of pneumothorax and advised pt to go to ER immediately if these symptoms develop advise them of dry needling treatment. Extensive time spent with pt to ensure full understanding of TDN risks. Pt provided verbal consent to treatment. TDN performed to R upper trapezius, R levator scapulae, and bilateral C6-7 splenius cervicis with 0.25 x 40 single needle placements with local twitch response (LTR). Pistoning technique utilized. Moderate soreness following intervention without significant  pain.    *not today* Cervical SNAG; x10 for R rotation Upper trapezius stretch; 2 x 30 sec Scapular clock, posterior roll; x20  -pain with retraction/tightness along R lateral pectoral region MHP (unbilled) utilized post-treatment for analgesic effect and improved soft tissue extensibility; along posterior C-spine/upper traps in prone lying; x 6 minutes    PATIENT EDUCATION:  Education details: see above for patient education details Person educated: Patient Education method: Explanation, Demonstration, and Handouts Education comprehension: verbalized understanding and returned demonstration   HOME EXERCISE PROGRAM:  Access Code: [email or web address] URL: [email or web address] Date: 03/29/2023 Prepared by: Ronita Oris  Exercises - Seated Scalene Stretch with Towel  - 1 x daily - 7 x weekly - 1 sets -  10 reps - 10secs hold - Corner Pec Major Stretch  - 1 x daily - 7 x weekly - 1 sets - 10 reps - 10sec hold - Scapular Retraction with Resistance  - 1 x daily - 7 x weekly - 3 sets - 10 reps - Standing Shoulder Shrug and Retraction with Resistance  - 1 x daily - 7 x weekly - 3 sets - 10 reps      ASSESSMENT:  CLINICAL IMPRESSION: Patient demonstrates improving ROM overall; she has functional ROM for both R and L rotation. She has some discomfort remaining with R upper trapezius pain with end-range cervical flexion. Pt has responded fairly well with DTM/TPR in this region, but continuous soreness is reported with successive visits. Pt fortunately feels that periscapular/thoracic pain has not been as apparent. We continued progression of postural re-education/scapular retraction-depression drills today - pt tolerates these well. Pt has current remaining deficits in: decreased cervical spine AROM, pain with loading R deltoid and pectoral region/decreased R shoulder and biceps strength, difficulty accessing overhead motion of R shoulder, postural changes, tenderness to palpation and  pain along R upper trapezius/R periscapular region with moderate intermittent referral to R upper arm. Pt will benefit from skilled PT services to address deficits and improve function.   OBJECTIVE IMPAIRMENTS: decreased ROM, decreased strength, hypomobility, impaired flexibility, impaired UE functional use, postural dysfunction, and pain.   ACTIVITY LIMITATIONS: carrying, lifting, reach over head, hygiene/grooming, and caring for others  PARTICIPATION LIMITATIONS: meal prep, cleaning, driving, and community activity  PERSONAL FACTORS: Age, Past/current experiences, Time since onset of injury/illness/exacerbation, and 3+ comorbidities: hx of breast cancer,   are also affecting patient's functional outcome.   REHAB POTENTIAL: Good  CLINICAL DECISION MAKING: Evolving/moderate complexity  EVALUATION COMPLEXITY: Moderate   GOALS: Goals reviewed with patient? Yes  SHORT TERM GOALS: Target date: 03/30/2023  Pt will be independent with HEP to improve strength and decrease neck pain to improve pain-free function at home and work. Baseline: 03/08/23: Baseline HEP initiated, link to program provided via e-mail.  Goal status: INITIAL   LONG TERM GOALS: Target date: 04/29/2023  Pt will increase FOTO to at least 64 to demonstrate significant improvement in function at home and work related to neck pain  Baseline: 03/08/23: 57 Goal status: INITIAL  2.  Pt will decrease worst neck pain by at least 2 points on the NPRS in order to demonstrate clinically significant reduction in neck pain. Baseline: 03/08/23: 8/10 pain at worst Goal status: INITIAL  3.  Pt will demonstrate cervical rotation to 60 deg or greater bilat as needed for scanning environment and driving     Baseline: 87/7/75: Cervical AROM: Rotation R 52, L 47 Goal status: INITIAL  4.  Patient will tolerate full bilateral shoulder elevation without reproduction of pain as needed for reaching, overhead object manipulation, and  intermittently for self-care ADLs Baseline: 03/08/23: Shoulder flexion to 140 bilat with R shoulder/upper trap pain with overhead motion.  Goal status: INITIAL   PLAN: PT FREQUENCY: 1-2x/week  PT DURATION: 6 weeks  PLANNED INTERVENTIONS: Therapeutic exercises, Therapeutic activity, Neuromuscular re-education, Patient/Family education, Self Care, Joint mobilization, Dry Needling, Electrical stimulation, Spinal mobilization, Cryotherapy, Moist heat, Traction, Manual therapy, and Re-evaluation.  PLAN FOR NEXT SESSION: Soft tissue mobilization and manual C-spine mobilization techniques to improve ROM. Consider dry needling for R upper trap and R periscapular soft tissue. Continue with postural re-edu drills and manual therapy + exercise to improve pectoral/anterior shoulder girdle flexibility.     Venetia Endo,  PT, DPT #[id number] 9:16 AM,04/08/23

## 2023-04-09 ENCOUNTER — Ambulatory Visit: Payer: Medicare Other | Admitting: Physical Therapy

## 2023-04-09 DIAGNOSIS — M542 Cervicalgia: Secondary | ICD-10-CM

## 2023-04-09 DIAGNOSIS — R2681 Unsteadiness on feet: Secondary | ICD-10-CM

## 2023-04-09 DIAGNOSIS — M79621 Pain in right upper arm: Secondary | ICD-10-CM

## 2023-04-09 DIAGNOSIS — M6281 Muscle weakness (generalized): Secondary | ICD-10-CM

## 2023-05-19 ENCOUNTER — Ambulatory Visit: Payer: Medicare Other | Admitting: Oncology

## 2023-05-19 ENCOUNTER — Other Ambulatory Visit: Payer: Medicare Other

## 2023-05-25 ENCOUNTER — Ambulatory Visit
Admission: RE | Admit: 2023-05-25 | Discharge: 2023-05-25 | Disposition: A | Discharge status: Home / Self Care | Payer: Medicare Other | Source: Ambulatory Visit | Attending: Oncology | Admitting: Oncology

## 2023-05-25 DIAGNOSIS — C50911 Malignant neoplasm of unspecified site of right female breast: Secondary | ICD-10-CM | POA: Insufficient documentation

## 2023-05-25 DIAGNOSIS — Z1231 Encounter for screening mammogram for malignant neoplasm of breast: Secondary | ICD-10-CM | POA: Diagnosis present

## 2023-06-01 ENCOUNTER — Encounter: Payer: Self-pay | Admitting: Oncology

## 2023-06-01 ENCOUNTER — Inpatient Hospital Stay: Payer: Medicare Other | Attending: Oncology

## 2023-06-01 ENCOUNTER — Inpatient Hospital Stay (HOSPITAL_BASED_OUTPATIENT_CLINIC_OR_DEPARTMENT_OTHER): Payer: Medicare Other | Admitting: Oncology

## 2023-06-01 VITALS — BP 180/56 | HR 55 | Temp 97.3°F | Wt 150.0 lb

## 2023-06-01 DIAGNOSIS — Z9221 Personal history of antineoplastic chemotherapy: Secondary | ICD-10-CM | POA: Diagnosis not present

## 2023-06-01 DIAGNOSIS — Z923 Personal history of irradiation: Secondary | ICD-10-CM | POA: Insufficient documentation

## 2023-06-01 DIAGNOSIS — C50911 Malignant neoplasm of unspecified site of right female breast: Secondary | ICD-10-CM | POA: Diagnosis not present

## 2023-06-01 DIAGNOSIS — C50919 Malignant neoplasm of unspecified site of unspecified female breast: Secondary | ICD-10-CM

## 2023-06-01 DIAGNOSIS — Z9011 Acquired absence of right breast and nipple: Secondary | ICD-10-CM | POA: Insufficient documentation

## 2023-06-01 DIAGNOSIS — Z08 Encounter for follow-up examination after completed treatment for malignant neoplasm: Secondary | ICD-10-CM

## 2023-06-01 DIAGNOSIS — Z853 Personal history of malignant neoplasm of breast: Secondary | ICD-10-CM

## 2023-06-02 LAB — CANCER ANTIGEN 27.29: CA 27.29: 17.8 U/mL (ref 0.0–38.6)

## 2023-06-02 NOTE — Progress Notes (Signed)
 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  She had lumpectomy, postlumpectomy radiation and 5 years of tamoxifen back then.  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  Tumor was T1b N0 M0.  She underwent mastectomy and was started on letrozole by Dr. Doylene Canning.  She did not require postmastectomy radiation or chemotherapy.  It appears that patient did not complete 5 years of letrozole and was lost to follow-up.  Patient self palpated a mass along her mastectomy scar which led toLeft breast mammogram as well as right chest wall ultrasound.  Mammogram showed 1.3 cm medial right breast/chest mass which contacts and appears to invade underlying pectoralis muscle.  No abnormal appearing right axillary lymph nodes.  No mammographic appearance of left breast malignancy.   Systemic scans were negative for metastatic disease.  Patient was seen by Dr. Dellis Anes at [facility] and underwent definitive excision of the postmastectomy recurrence.  Final pathSurgery showed 1.6 cm grade 2 invasive mammary carcinoma with negative margins.  ER 100% positive, PR 80% positive and HER2 negative.  Patient was also seen by radiation oncology at [facility] and declined adjuvant radiation. She started letrozole in April  2024 but stopped subsequently due to intolerance  Interval history-patient overall feels well presently.  She is off endocrine therapy.  Denies any new aches and pains anywhere.  Appetite and weight have remained stable  ECOG PS- 1 Pain scale- 0   Review of systems- Review of Systems  Constitutional:  Negative for chills, fever, malaise/fatigue and weight loss.  HENT:  Negative for congestion, ear discharge and nosebleeds.   Eyes:  Negative for blurred vision.  Respiratory:  Negative for cough, hemoptysis, sputum production, shortness of breath and wheezing.   Cardiovascular:  Negative for chest pain, palpitations, orthopnea and claudication.  Gastrointestinal:  Negative for abdominal pain, blood in stool, constipation, diarrhea, heartburn, melena, nausea and vomiting.  Genitourinary:  Negative for dysuria, flank pain, frequency, hematuria and urgency.  Musculoskeletal:  Negative for back pain, joint pain and myalgias.  Skin:  Negative for rash.  Neurological:  Negative for dizziness, tingling, focal weakness, seizures, weakness and headaches.  Endo/Heme/Allergies:  Does not bruise/bleed easily.  Psychiatric/Behavioral:  Negative for depression and suicidal ideas. The patient does not have insomnia.       Allergies  Allergen Reactions   Alendronate Diarrhea    Other reaction(s): Dizziness, Other (See Comments) Joint pain   Nadolol     Other reaction(s): Dizziness Per patient, "Felt like the back of head was going to explode"   Statins     Other reaction(s): Muscle Pain Mevacor-myalgias.  Crestor-"Just didn't feel right."     Past Medical History:  Diagnosis Date   Arthritis    Breast cancer (HCC) 2000 and 2016   right breast ca twice.  Mastectomy in 2016   Cataract  Diabetes mellitus without complication (HCC)    GERD (gastroesophageal reflux disease)    Hx of migraines    from ages [age]   Hypercholesteremia    Hypertension    Kidney stone on right side     Personal history of radiation therapy 2000   right breast ca   Thyroid disease    Varicose vein of leg      Past Surgical History:  Procedure Laterality Date   BREAST BIOPSY Right 2000   breast ca   BREAST BIOPSY Right 2016   invasive mammary-mastectomy   BREAST BIOPSY Right 05/27/2022   [place] RT BREAST BX W LOC DEV 1ST LESION IMG BX SPEC US GUIDE 05/27/2022 [facility]   BREAST LUMPECTOMY Right 2000   breast ca with rad tx and tamoxifen   CATARACT EXTRACTION W/ INTRAOCULAR LENS  IMPLANT, BILATERAL     MASTECTOMY Right 2016   + complete mastectomy and letrozole tx   SIMPLE MASTECTOMY WITH AXILLARY SENTINEL NODE BIOPSY Right 08/21/2014   Procedure: SIMPLE MASTECTOMY WITH AXILLARY SENTINEL NODE BIOPSY;  Surgeon: Renda Rolls, MD;  Location: [facility] ORS;  Service: General;  Laterality: Right;   TONSILLECTOMY     VARICOSE VEIN SURGERY Left    laser surgery    Social History   Socioeconomic History   Marital status: Married    Spouse name: Not on file   Number of children: Not on file   Years of education: Not on file   Highest education level: Not on file  Occupational History   Not on file  Tobacco Use   Smoking status: Former    Current packs/day: 0.00    Average packs/day: 0.5 packs/day for 10.0 years (5.0 ttl pk-yrs)    Types: Cigarettes    Start date: 08/04/1960    Quit date: 08/05/1970    Years since quitting: 52.8   Smokeless tobacco: Never  Vaping Use   Vaping status: Never Used  Substance and Sexual Activity   Alcohol use: No   Drug use: No   Sexual activity: Yes    Birth control/protection: Post-menopausal  Other Topics Concern   Not on file  Social History Narrative   Not on file   Social Drivers of Health   Financial Resource Strain: Low Risk  (05/14/2023)   Received from [facility]   Overall Financial Resource Strain (CARDIA)    Difficulty of Paying Living Expenses: Not hard at all  Food Insecurity: No Food Insecurity (05/14/2023)    Received from [facility]   Hunger Vital Sign    Worried About Running Out of Food in the Last Year: Never true    Ran Out of Food in the Last Year: Never true  Transportation Needs: No Transportation Needs (05/14/2023)   Received from [facility] - Transportation    In the past 12 months, has lack of transportation kept you from medical appointments or from getting medications?: No    Lack of Transportation (Non-Medical): No  Physical Activity: Not on file  Stress: Not on file  Social Connections: Not on file  Intimate Partner Violence: Not At Risk (06/01/2022)   Humiliation, Afraid, Rape, and Kick questionnaire    Fear of Current or Ex-Partner: No    Emotionally Abused: No    Physically Abused: No    Sexually Abused: No    Family History  Problem Relation Age of Onset   Osteoarthritis Mother    Heart disease Father  Dementia Sister    Breast cancer Daughter 58     Current Outpatient Medications:    acetaminophen (TYLENOL) 500 MG tablet, Take 500 mg by mouth every 6 (six) hours as needed for mild pain., Disp: , Rfl:    allopurinol (ZYLOPRIM) 300 MG tablet, Take 300 mg by mouth daily., Disp: , Rfl:    furosemide (LASIX) 20 MG tablet, Take by mouth., Disp: , Rfl:    hydrochlorothiazide (HYDRODIURIL) 12.5 MG tablet, Take by mouth., Disp: , Rfl:    levothyroxine (SYNTHROID, LEVOTHROID) 100 MCG tablet, Take 100 mcg by mouth daily before breakfast., Disp: , Rfl:    lisinopril (ZESTRIL) 20 MG tablet, Take by mouth., Disp: , Rfl:    metFORMIN (GLUCOPHAGE) 500 MG tablet, Take 500 mg by mouth every morning. , Disp: , Rfl:    colchicine 0.6 MG tablet, Take 0.6 mg by mouth as needed. (Patient not taking: Reported on 06/01/2023), Disp: , Rfl:    gabapentin (NEURONTIN) 100 MG capsule, Take by mouth., Disp: , Rfl:    letrozole (FEMARA) 2.5 MG tablet, Take 1 tablet (2.5 mg total) by mouth daily. (Patient not taking: Reported on 06/01/2023), Disp: 30  tablet, Rfl: 3  Physical exam:  Vitals:   06/01/23 1021  BP: (!) 180/56  Pulse: (!) 55  Temp: (!) 97.3 F (36.3 C)  TempSrc: Oral  SpO2: 100%  Weight: 150 lb (68 kg)   Physical Exam Cardiovascular:     Rate and Rhythm: Normal rate and regular rhythm.     Heart sounds: Normal heart sounds.  Pulmonary:     Effort: Pulmonary effort is normal.     Breath sounds: Normal breath sounds.  Skin:    General: Skin is warm and dry.  Neurological:     Mental Status: She is alert and oriented to person, place, and time.        Latest Ref Rng & Units 06/01/2022    3:21 PM  CMP  Glucose 70 - 99 mg/dL 409   BUN 8 - 23 mg/dL 25   Creatinine 8.11 - 1.00 mg/dL 9.14   Sodium 782 - 956 mmol/L 139   Potassium 3.5 - 5.1 mmol/L 4.3   Chloride 98 - 111 mmol/L 100   CO2 22 - 32 mmol/L 27   Calcium 8.9 - 10.3 mg/dL 9.7   Total Protein 6.5 - 8.1 g/dL 7.5   Total Bilirubin 0.3 - 1.2 mg/dL 0.5   Alkaline Phos 38 - 126 U/L 70   AST 15 - 41 U/L 25   ALT 0 - 44 U/L 22       Latest Ref Rng & Units 06/01/2022    3:21 PM  CBC  WBC 4.0 - 10.5 K/uL 8.0   Hemoglobin 12.0 - 15.0 g/dL 21.3   Hematocrit 08.6 - 46.0 % 42.4   Platelets 150 - 400 K/uL 313     No images are attached to the encounter.  MM 3D SCREENING MAMMOGRAM UNILATERAL LEFT BREAST Result Date: 05/28/2023 CLINICAL DATA:  Screening. EXAM: DIGITAL SCREENING UNILATERAL LEFT MAMMOGRAM WITH CAD AND TOMOSYNTHESIS TECHNIQUE: Left screening digital craniocaudal and mediolateral oblique mammograms were obtained. Left screening digital breast tomosynthesis was performed. The images were evaluated with computer-aided detection. COMPARISON:  Previous exam(s). ACR Breast Density Category c: The breasts are heterogeneously dense, which may obscure small masses. FINDINGS: The patient has had a right mastectomy. There are no findings suspicious for malignancy. IMPRESSION: No mammographic evidence of malignancy. A result letter of this screening mammogram  will be mailed directly to the patient. RECOMMENDATION: Screening mammogram in one year.  (Code:SM-L-54M) BI-RADS CATEGORY  1: Negative. Electronically Signed   By: Harmon Pier M.D.   On: 05/28/2023 12:09     Assessment and plan- Patient is a 83 y.o. female with h/o recurrent breast cancer here for routine f/u  Patient does not have any evidence of chest wall recurrence based on today's exam.  No palpable bilateral axillary adenopathy.  She is comfortable with her decision of not pursuing endocrine therapy due to intolerable side effects.  CBC with differential CMP CA 27-29 and I will see her in 6 months   Visit Diagnosis 1. Recurrent breast cancer, right (HCC)   2. Encounter for follow-up surveillance of breast cancer      Dr. Owens Shark, MD, MPH [facility] at [facility] [phone] 06/02/2023 8:54 AM

## 2023-06-03 ENCOUNTER — Encounter: Payer: Self-pay | Admitting: *Deleted

## 2023-11-17 ENCOUNTER — Telehealth: Payer: Self-pay | Admitting: *Deleted

## 2023-11-17 NOTE — Telephone Encounter (Signed)
 Sierra did some labs on the patient and she called in to let Dr. Melanee look these numbers see if she needs to come back and see you guys or maybe labs later on.  She says the white blood cells are at 22, lymph are 4, GFR 34, calcium  5.6, uric acid 11.2, and she says the vitamin D  is low but she did not give me an actual number.  She would like to see this information and if anything that she can help for the patient she can do labs in the next 1 or 2 weeks or what ever Dr. Melanee will tell us  what to do o

## 2023-11-17 NOTE — Telephone Encounter (Signed)
 Joen- Please have them fax the labs to us  for review. We cannot keep calling other clinics for routine labs that they draw

## 2023-11-25 ENCOUNTER — Encounter: Payer: Self-pay | Admitting: Oncology

## 2023-11-25 ENCOUNTER — Other Ambulatory Visit: Payer: Self-pay | Admitting: Internal Medicine

## 2023-11-25 DIAGNOSIS — R0789 Other chest pain: Secondary | ICD-10-CM

## 2023-11-29 ENCOUNTER — Inpatient Hospital Stay

## 2023-11-29 ENCOUNTER — Inpatient Hospital Stay (HOSPITAL_BASED_OUTPATIENT_CLINIC_OR_DEPARTMENT_OTHER): Payer: Medicare Other | Admitting: Nurse Practitioner

## 2023-11-29 ENCOUNTER — Encounter: Payer: Self-pay | Admitting: Nurse Practitioner

## 2023-11-29 ENCOUNTER — Other Ambulatory Visit: Payer: Self-pay | Admitting: Lab

## 2023-11-29 ENCOUNTER — Inpatient Hospital Stay: Payer: Medicare Other | Attending: Oncology

## 2023-11-29 VITALS — BP 138/48 | HR 65 | Temp 95.0°F | Resp 17 | Wt 149.0 lb

## 2023-11-29 DIAGNOSIS — Z79811 Long term (current) use of aromatase inhibitors: Secondary | ICD-10-CM | POA: Insufficient documentation

## 2023-11-29 DIAGNOSIS — Z87891 Personal history of nicotine dependence: Secondary | ICD-10-CM | POA: Diagnosis not present

## 2023-11-29 DIAGNOSIS — C50911 Malignant neoplasm of unspecified site of right female breast: Secondary | ICD-10-CM

## 2023-11-29 DIAGNOSIS — I10 Essential (primary) hypertension: Secondary | ICD-10-CM | POA: Insufficient documentation

## 2023-11-29 DIAGNOSIS — Z7984 Long term (current) use of oral hypoglycemic drugs: Secondary | ICD-10-CM | POA: Insufficient documentation

## 2023-11-29 DIAGNOSIS — R944 Abnormal results of kidney function studies: Secondary | ICD-10-CM | POA: Diagnosis not present

## 2023-11-29 DIAGNOSIS — Z170 Estrogen receptor positive status [ER+]: Secondary | ICD-10-CM | POA: Diagnosis not present

## 2023-11-29 DIAGNOSIS — Z79899 Other long term (current) drug therapy: Secondary | ICD-10-CM | POA: Insufficient documentation

## 2023-11-29 DIAGNOSIS — E119 Type 2 diabetes mellitus without complications: Secondary | ICD-10-CM | POA: Diagnosis not present

## 2023-11-29 DIAGNOSIS — N289 Disorder of kidney and ureter, unspecified: Secondary | ICD-10-CM | POA: Diagnosis not present

## 2023-11-29 DIAGNOSIS — D72829 Elevated white blood cell count, unspecified: Secondary | ICD-10-CM

## 2023-11-29 DIAGNOSIS — Z1231 Encounter for screening mammogram for malignant neoplasm of breast: Secondary | ICD-10-CM

## 2023-11-29 DIAGNOSIS — Z803 Family history of malignant neoplasm of breast: Secondary | ICD-10-CM | POA: Diagnosis not present

## 2023-11-29 DIAGNOSIS — Z1732 Human epidermal growth factor receptor 2 negative status: Secondary | ICD-10-CM | POA: Diagnosis not present

## 2023-11-29 DIAGNOSIS — C50919 Malignant neoplasm of unspecified site of unspecified female breast: Secondary | ICD-10-CM

## 2023-11-29 DIAGNOSIS — Z1721 Progesterone receptor positive status: Secondary | ICD-10-CM | POA: Insufficient documentation

## 2023-11-29 DIAGNOSIS — Z08 Encounter for follow-up examination after completed treatment for malignant neoplasm: Secondary | ICD-10-CM

## 2023-11-29 LAB — CMP (CANCER CENTER ONLY)
ALT: 17 U/L (ref 0–44)
AST: 28 U/L (ref 15–41)
Albumin: 4 g/dL (ref 3.5–5.0)
Alkaline Phosphatase: 78 U/L (ref 38–126)
Anion gap: 11 (ref 5–15)
BUN: 43 mg/dL — ABNORMAL HIGH (ref 8–23)
CO2: 25 mmol/L (ref 22–32)
Calcium: 9.9 mg/dL (ref 8.9–10.3)
Chloride: 103 mmol/L (ref 98–111)
Creatinine: 1.78 mg/dL — ABNORMAL HIGH (ref 0.44–1.00)
GFR, Estimated: 28 mL/min — ABNORMAL LOW (ref >60)
Glucose, Bld: 146 mg/dL — ABNORMAL HIGH (ref 70–99)
Potassium: 5 mmol/L (ref 3.5–5.1)
Sodium: 139 mmol/L (ref 135–145)
Total Bilirubin: 0.8 mg/dL (ref 0.0–1.2)
Total Protein: 7.2 g/dL (ref 6.5–8.1)

## 2023-11-29 LAB — CBC WITH DIFFERENTIAL (CANCER CENTER ONLY)
Abs Immature Granulocytes: 3.84 K/uL — ABNORMAL HIGH (ref 0.00–0.07)
Basophils Absolute: 1 K/uL — ABNORMAL HIGH (ref 0.0–0.1)
Basophils Relative: 4 %
Eosinophils Absolute: 0.8 K/uL — ABNORMAL HIGH (ref 0.0–0.5)
Eosinophils Relative: 4 %
HCT: 40.5 % (ref 36.0–46.0)
Hemoglobin: 13.2 g/dL (ref 12.0–15.0)
Immature Granulocytes: 17 %
Lymphocytes Relative: 18 %
Lymphs Abs: 4.1 K/uL — ABNORMAL HIGH (ref 0.7–4.0)
MCH: 27.7 pg (ref 26.0–34.0)
MCHC: 32.6 g/dL (ref 30.0–36.0)
MCV: 84.9 fL (ref 80.0–100.0)
Monocytes Absolute: 1.9 K/uL — ABNORMAL HIGH (ref 0.1–1.0)
Monocytes Relative: 8 %
Neutro Abs: 10.8 K/uL — ABNORMAL HIGH (ref 1.7–7.7)
Neutrophils Relative %: 49 %
Platelet Count: 264 K/uL (ref 150–400)
RBC: 4.77 MIL/uL (ref 3.87–5.11)
RDW: 15.5 % (ref 11.5–15.5)
WBC Count: 22.4 K/uL — ABNORMAL HIGH (ref 4.0–10.5)
nRBC: 0 % (ref 0.0–0.2)

## 2023-11-29 LAB — URINALYSIS, COMPLETE (UACMP) WITH MICROSCOPIC
Bilirubin Urine: NEGATIVE
Glucose, UA: NEGATIVE mg/dL
Hgb urine dipstick: NEGATIVE
Ketones, ur: NEGATIVE mg/dL
Leukocytes,Ua: NEGATIVE
Nitrite: NEGATIVE
Protein, ur: NEGATIVE mg/dL
Specific Gravity, Urine: 1.004 — ABNORMAL LOW (ref 1.005–1.030)
pH: 6 (ref 5.0–8.0)

## 2023-11-29 LAB — CANCER ANTIGEN 27.29: CA 27.29: 22.1 U/mL (ref 0.0–38.6)

## 2023-11-29 MED ORDER — SODIUM CHLORIDE 0.9 % IV SOLN
Freq: Once | INTRAVENOUS | Status: AC
  Filled 2023-11-29: qty 250

## 2023-11-29 NOTE — Progress Notes (Unsigned)
 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  She had lumpectomy, postlumpectomy radiation and 5 years of tamoxifen back then.  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  Tumor was T1b N0 M0.  She underwent mastectomy and was started on letrozole  by Dr. Wilder.  She did not require postmastectomy radiation or chemotherapy.  It appears that patient did not complete 5 years of letrozole  and was lost to follow-up.  Patient self palpated a mass along her mastectomy scar which led toLeft breast mammogram as well as right chest wall ultrasound.  Mammogram showed 1.3 cm medial right breast/chest mass which contacts and appears to invade underlying pectoralis muscle.  No abnormal appearing right axillary lymph nodes.  No mammographic appearance of left breast malignancy.   Systemic scans were negative for metastatic disease.  Patient was seen by Dr. Iva at [facility] and underwent definitive excision of the postmastectomy recurrence.  Final pathSurgery showed 1.6 cm grade 2 invasive mammary carcinoma with negative margins.  ER 100% positive, PR 80% positive and HER2 negative.  Patient was also seen by radiation oncology at [facility] and declined adjuvant radiation. She started letrozole  in  April 2024 but stopped subsequently due to intolerance  Interval history-  patient overall feels well presently.  She is off endocrine therapy.  Denies any new aches and pains anywhere.  Appetite and weight have remained stable  ECOG PS- 1 Pain scale- 0   Review of systems- Review of Systems  Constitutional:  Negative for chills, fever, malaise/fatigue and weight loss.  HENT:  Negative for congestion, ear discharge and nosebleeds.   Eyes:  Negative for blurred vision.  Respiratory:  Negative for cough, hemoptysis, sputum production, shortness of breath and wheezing.   Cardiovascular:  Negative for chest pain, palpitations, orthopnea and claudication.  Gastrointestinal:  Negative for abdominal pain, blood in stool, constipation, diarrhea, heartburn, melena, nausea and vomiting.  Genitourinary:  Negative for dysuria, flank pain, frequency, hematuria and urgency.  Musculoskeletal:  Negative for back pain, joint pain and myalgias.  Skin:  Negative for rash.  Neurological:  Negative for dizziness, tingling, focal weakness, seizures, weakness and headaches.  Endo/Heme/Allergies:  Does not bruise/bleed easily.  Psychiatric/Behavioral:  Negative for depression and suicidal ideas. The patient does not have insomnia.       Allergies  Allergen Reactions   Alendronate Diarrhea    Other reaction(s): Dizziness, Other (See Comments) Joint pain   Nadolol     Other reaction(s): Dizziness Per patient, Felt like the back of head was going to explode   Statins     Other reaction(s): Muscle Pain Mevacor-myalgias.  Crestor-Just didn't feel right.     Past Medical History:  Diagnosis Date   Arthritis    Breast cancer (HCC) 2000 and 2016   right breast ca twice.  Mastectomy in 2016  Cataract    Diabetes mellitus without complication (HCC)    GERD (gastroesophageal reflux disease)    Hx of migraines    from ages [age]   Hypercholesteremia    Hypertension    Kidney stone on right side     Personal history of radiation therapy 2000   right breast ca   Thyroid disease    Varicose vein of leg      Past Surgical History:  Procedure Laterality Date   BREAST BIOPSY Right 2000   breast ca   BREAST BIOPSY Right 2016   invasive mammary-mastectomy   BREAST BIOPSY Right 05/27/2022   US  RT BREAST BX W LOC DEV 1ST LESION IMG BX SPEC US  GUIDE 05/27/2022 [facility]   BREAST LUMPECTOMY Right 2000   breast ca with rad tx and tamoxifen   CATARACT EXTRACTION W/ INTRAOCULAR LENS  IMPLANT, BILATERAL     MASTECTOMY Right 2016   + complete mastectomy and letrozole  tx   SIMPLE MASTECTOMY WITH AXILLARY SENTINEL NODE BIOPSY Right 08/21/2014   Procedure: SIMPLE MASTECTOMY WITH AXILLARY SENTINEL NODE BIOPSY;  Surgeon: Unknown Sharps, MD;  Location: [facility] ORS;  Service: General;  Laterality: Right;   TONSILLECTOMY     VARICOSE VEIN SURGERY Left    laser surgery    Social History   Socioeconomic History   Marital status: Married    Spouse name: Not on file   Number of children: Not on file   Years of education: Not on file   Highest education level: Not on file  Occupational History   Not on file  Tobacco Use   Smoking status: Former    Current packs/day: 0.00    Average packs/day: 0.5 packs/day for 10.0 years (5.0 ttl pk-yrs)    Types: Cigarettes    Start date: 08/04/1960    Quit date: 08/05/1970    Years since quitting: 53.3   Smokeless tobacco: Never  Vaping Use   Vaping status: Never Used  Substance and Sexual Activity   Alcohol use: No   Drug use: No   Sexual activity: Yes    Birth control/protection: Post-menopausal  Other Topics Concern   Not on file  Social History Narrative   Not on file   Social Drivers of Health   Financial Resource Strain: Low Risk  (09/14/2023)   Received [facility]   Overall Financial Resource Strain (CARDIA)    Difficulty of Paying Living Expenses: Not hard at all  Food Insecurity: No Food Insecurity (09/14/2023)    Received from [facility]   Hunger Vital Sign    Within the past 12 months, you worried that your food would run out before you got the money to buy more.: Never true    Within the past 12 months, the food you bought just didn't last and you didn't have money to get more.: Never true  Transportation Needs: No Transportation Needs (09/14/2023)   Received from [facility] - Transportation    In the past 12 months, has lack of transportation kept you from medical appointments or from getting medications?: No    Lack of Transportation (Non-Medical): No  Physical Activity: Not on file  Stress: Not on file  Social Connections: Not on file  Intimate Partner Violence: Not At Risk (06/01/2022)   Humiliation, Afraid, Rape, and Kick questionnaire    Fear of Current or Ex-Partner: No    Emotionally Abused: No    Physically Abused: No  Sexually Abused: No    Family History  Problem Relation Age of Onset   Osteoarthritis Mother    Heart disease Father    Dementia Sister    Breast cancer Daughter 19     Current Outpatient Medications:    acetaminophen  (TYLENOL ) 500 MG tablet, Take 500 mg by mouth every 6 (six) hours as needed for mild pain., Disp: , Rfl:    allopurinol (ZYLOPRIM) 300 MG tablet, Take 300 mg by mouth daily., Disp: , Rfl:    colchicine 0.6 MG tablet, Take 0.6 mg by mouth as needed. (Patient not taking: Reported on 06/01/2023), Disp: , Rfl:    furosemide (LASIX) 20 MG tablet, Take by mouth., Disp: , Rfl:    gabapentin (NEURONTIN) 100 MG capsule, Take by mouth., Disp: , Rfl:    hydrochlorothiazide  (HYDRODIURIL ) 12.5 MG tablet, Take by mouth., Disp: , Rfl:    letrozole  (FEMARA ) 2.5 MG tablet, Take 1 tablet (2.5 mg total) by mouth daily. (Patient not taking: Reported on 06/01/2023), Disp: 30 tablet, Rfl: 3   levothyroxine  (SYNTHROID , LEVOTHROID) 100 MCG tablet, Take 100 mcg by mouth daily before breakfast., Disp: , Rfl:    lisinopril   (ZESTRIL ) 20 MG tablet, Take by mouth., Disp: , Rfl:    metFORMIN  (GLUCOPHAGE ) 500 MG tablet, Take 500 mg by mouth every morning. , Disp: , Rfl:   Physical exam:  There were no vitals filed for this visit.  Physical Exam Cardiovascular:     Rate and Rhythm: Normal rate and regular rhythm.     Heart sounds: Normal heart sounds.  Pulmonary:     Effort: Pulmonary effort is normal.     Breath sounds: Normal breath sounds.  Skin:    General: Skin is warm and dry.  Neurological:     Mental Status: She is alert and oriented to person, place, and time.         Latest Ref Rng & Units 06/01/2022    3:21 PM  CMP  Glucose 70 - 99 mg/dL 885   BUN 8 - 23 mg/dL 25   Creatinine 9.55 - 1.00 mg/dL 8.89   Sodium 864 - 854 mmol/L 139   Potassium 3.5 - 5.1 mmol/L 4.3   Chloride 98 - 111 mmol/L 100   CO2 22 - 32 mmol/L 27   Calcium  8.9 - 10.3 mg/dL 9.7   Total Protein 6.5 - 8.1 g/dL 7.5   Total Bilirubin 0.3 - 1.2 mg/dL 0.5   Alkaline Phos 38 - 126 U/L 70   AST 15 - 41 U/L 25   ALT 0 - 44 U/L 22       Latest Ref Rng & Units 06/01/2022    3:21 PM  CBC  WBC 4.0 - 10.5 K/uL 8.0   Hemoglobin 12.0 - 15.0 g/dL 86.2   Hematocrit 63.9 - 46.0 % 42.4   Platelets 150 - 400 K/uL 313     No images are attached to the encounter.  No results found.   Assessment and plan- Patient is a 83 y.o. female with h/o recurrent breast cancer here for routine f/u   Breast Cancer Surveillance-  Leukocytosis-  Acute kidney injury-   Add fluids today Feb mammogram 6 mo (after mammo)- lab, Dr Melanee for breast cancer surveillance- [place]  Patient does not have any evidence of chest wall recurrence based on today's exam.  No palpable bilateral axillary adenopathy.  She is comfortable with her decision of not pursuing endocrine therapy due to intolerable side effects.  CBC  with differential CMP CA 27-29 and I will see her in 6 months   Visit Diagnosis No diagnosis found.    Dr. Annah Skene, MD, MPH [facility]  [place] [phone] 11/29/2023 10:05 AM

## 2023-11-29 NOTE — Progress Notes (Unsigned)
 Patient here for oncology follow-up appointment, expresses concerns of fatigue, diarrhea and dizziness

## 2023-11-30 ENCOUNTER — Ambulatory Visit: Payer: Self-pay | Admitting: Nurse Practitioner

## 2023-12-02 ENCOUNTER — Ambulatory Visit
Admission: RE | Admit: 2023-12-02 | Discharge: 2023-12-02 | Disposition: A | Discharge status: Home / Self Care | Payer: Self-pay | Source: Ambulatory Visit | Attending: Internal Medicine | Admitting: Internal Medicine

## 2023-12-02 DIAGNOSIS — R0789 Other chest pain: Secondary | ICD-10-CM | POA: Insufficient documentation

## 2023-12-03 ENCOUNTER — Ambulatory Visit
Admission: RE | Admit: 2023-12-03 | Discharge: 2023-12-03 | Disposition: A | Discharge status: Home / Self Care | Payer: Self-pay | Source: Ambulatory Visit | Attending: Nurse Practitioner | Admitting: Nurse Practitioner

## 2023-12-03 ENCOUNTER — Other Ambulatory Visit: Payer: Self-pay

## 2023-12-03 DIAGNOSIS — C50911 Malignant neoplasm of unspecified site of right female breast: Secondary | ICD-10-CM | POA: Insufficient documentation

## 2023-12-03 MED ORDER — GADOBUTROL 1 MMOL/ML IV SOLN
7.0000 mL | Freq: Once | INTRAVENOUS | Status: AC | PRN
  Administered 2023-12-03: 7 mL via INTRAVENOUS

## 2023-12-16 ENCOUNTER — Other Ambulatory Visit: Payer: Self-pay | Admitting: Student

## 2023-12-16 DIAGNOSIS — R42 Dizziness and giddiness: Secondary | ICD-10-CM

## 2023-12-22 ENCOUNTER — Ambulatory Visit
Admission: RE | Admit: 2023-12-22 | Discharge: 2023-12-22 | Disposition: A | Discharge status: Home / Self Care | Source: Ambulatory Visit | Attending: Student | Admitting: Student

## 2023-12-22 DIAGNOSIS — R42 Dizziness and giddiness: Secondary | ICD-10-CM | POA: Diagnosis present

## 2023-12-31 ENCOUNTER — Other Ambulatory Visit: Payer: Self-pay | Admitting: Physical Medicine & Rehabilitation

## 2023-12-31 DIAGNOSIS — M5412 Radiculopathy, cervical region: Secondary | ICD-10-CM

## 2023-12-31 DIAGNOSIS — M5414 Radiculopathy, thoracic region: Secondary | ICD-10-CM

## 2023-12-31 DIAGNOSIS — G8929 Other chronic pain: Secondary | ICD-10-CM

## 2024-01-03 ENCOUNTER — Inpatient Hospital Stay: Attending: Oncology

## 2024-01-03 DIAGNOSIS — C9210 Chronic myeloid leukemia, BCR/ABL-positive, not having achieved remission: Secondary | ICD-10-CM | POA: Diagnosis not present

## 2024-01-03 DIAGNOSIS — D72829 Elevated white blood cell count, unspecified: Secondary | ICD-10-CM | POA: Diagnosis present

## 2024-01-03 LAB — CBC WITH DIFFERENTIAL/PLATELET
Abs Immature Granulocytes: 5.29 K/uL — ABNORMAL HIGH (ref 0.00–0.07)
Basophils Absolute: 1 K/uL — ABNORMAL HIGH (ref 0.0–0.1)
Basophils Relative: 4 %
Eosinophils Absolute: 0.9 K/uL — ABNORMAL HIGH (ref 0.0–0.5)
Eosinophils Relative: 4 %
HCT: 35.1 % — ABNORMAL LOW (ref 36.0–46.0)
Hemoglobin: 11.4 g/dL — ABNORMAL LOW (ref 12.0–15.0)
Immature Granulocytes: 21 %
Lymphocytes Relative: 16 %
Lymphs Abs: 4.2 K/uL — ABNORMAL HIGH (ref 0.7–4.0)
MCH: 27.9 pg (ref 26.0–34.0)
MCHC: 32.5 g/dL (ref 30.0–36.0)
MCV: 86 fL (ref 80.0–100.0)
Monocytes Absolute: 2.2 K/uL — ABNORMAL HIGH (ref 0.1–1.0)
Monocytes Relative: 9 %
Neutro Abs: 12 K/uL — ABNORMAL HIGH (ref 1.7–7.7)
Neutrophils Relative %: 46 %
Platelets: 310 K/uL (ref 150–400)
RBC: 4.08 MIL/uL (ref 3.87–5.11)
RDW: 16 % — ABNORMAL HIGH (ref 11.5–15.5)
Smear Review: NORMAL
WBC: 25.5 K/uL — ABNORMAL HIGH (ref 4.0–10.5)
nRBC: 0.1 % (ref 0.0–0.2)

## 2024-01-03 LAB — C-REACTIVE PROTEIN: CRP: 0.7 mg/dL (ref <1.0)

## 2024-01-03 LAB — COMPREHENSIVE METABOLIC PANEL WITH GFR
ALT: 17 U/L (ref 0–44)
AST: 28 U/L (ref 15–41)
Albumin: 3.8 g/dL (ref 3.5–5.0)
Alkaline Phosphatase: 69 U/L (ref 38–126)
Anion gap: 10 (ref 5–15)
BUN: 40 mg/dL — ABNORMAL HIGH (ref 8–23)
CO2: 25 mmol/L (ref 22–32)
Calcium: 9.8 mg/dL (ref 8.9–10.3)
Chloride: 104 mmol/L (ref 98–111)
Creatinine, Ser: 1.81 mg/dL — ABNORMAL HIGH (ref 0.44–1.00)
GFR, Estimated: 27 mL/min — ABNORMAL LOW (ref >60)
Glucose, Bld: 135 mg/dL — ABNORMAL HIGH (ref 70–99)
Potassium: 4.6 mmol/L (ref 3.5–5.1)
Sodium: 139 mmol/L (ref 135–145)
Total Bilirubin: 0.7 mg/dL (ref 0.0–1.2)
Total Protein: 6.9 g/dL (ref 6.5–8.1)

## 2024-01-03 LAB — LACTATE DEHYDROGENASE: LDH: 291 U/L — ABNORMAL HIGH (ref 98–192)

## 2024-01-03 LAB — SEDIMENTATION RATE: Sed Rate: 52 mm/h — ABNORMAL HIGH (ref 0–30)

## 2024-01-05 ENCOUNTER — Encounter: Payer: Self-pay | Admitting: Nurse Practitioner

## 2024-01-05 NOTE — Telephone Encounter (Signed)
 Some of her labs are still pending and I could see her in about 10 days time to discuss

## 2024-01-06 LAB — COMP PANEL: LEUKEMIA/LYMPHOMA

## 2024-01-07 LAB — BCR-ABL1 FISH
Cells Analyzed: 200
Cells Counted: 200

## 2024-01-17 ENCOUNTER — Telehealth: Payer: Self-pay | Admitting: Pharmacist

## 2024-01-17 ENCOUNTER — Encounter: Payer: Self-pay | Admitting: Oncology

## 2024-01-17 ENCOUNTER — Ambulatory Visit
Admission: RE | Admit: 2024-01-17 | Discharge: 2024-01-17 | Disposition: A | Discharge status: Home / Self Care | Source: Ambulatory Visit | Attending: Oncology | Admitting: Oncology

## 2024-01-17 ENCOUNTER — Telehealth: Payer: Self-pay | Admitting: Pharmacy Technician

## 2024-01-17 ENCOUNTER — Inpatient Hospital Stay: Attending: Oncology | Admitting: Oncology

## 2024-01-17 ENCOUNTER — Inpatient Hospital Stay

## 2024-01-17 ENCOUNTER — Other Ambulatory Visit: Payer: Self-pay

## 2024-01-17 ENCOUNTER — Other Ambulatory Visit (HOSPITAL_COMMUNITY): Payer: Self-pay

## 2024-01-17 VITALS — BP 125/51 | HR 62 | Temp 96.3°F | Resp 18 | Ht 67.0 in | Wt 151.3 lb

## 2024-01-17 DIAGNOSIS — C9210 Chronic myeloid leukemia, BCR/ABL-positive, not having achieved remission: Secondary | ICD-10-CM

## 2024-01-17 DIAGNOSIS — Z87891 Personal history of nicotine dependence: Secondary | ICD-10-CM | POA: Insufficient documentation

## 2024-01-17 DIAGNOSIS — C50911 Malignant neoplasm of unspecified site of right female breast: Secondary | ICD-10-CM | POA: Diagnosis present

## 2024-01-17 DIAGNOSIS — I129 Hypertensive chronic kidney disease with stage 1 through stage 4 chronic kidney disease, or unspecified chronic kidney disease: Secondary | ICD-10-CM | POA: Diagnosis not present

## 2024-01-17 DIAGNOSIS — E1122 Type 2 diabetes mellitus with diabetic chronic kidney disease: Secondary | ICD-10-CM | POA: Diagnosis not present

## 2024-01-17 DIAGNOSIS — R001 Bradycardia, unspecified: Secondary | ICD-10-CM | POA: Diagnosis not present

## 2024-01-17 DIAGNOSIS — Z79899 Other long term (current) drug therapy: Secondary | ICD-10-CM | POA: Insufficient documentation

## 2024-01-17 DIAGNOSIS — Z7984 Long term (current) use of oral hypoglycemic drugs: Secondary | ICD-10-CM | POA: Diagnosis not present

## 2024-01-17 DIAGNOSIS — Z853 Personal history of malignant neoplasm of breast: Secondary | ICD-10-CM | POA: Diagnosis not present

## 2024-01-17 DIAGNOSIS — N189 Chronic kidney disease, unspecified: Secondary | ICD-10-CM | POA: Diagnosis not present

## 2024-01-17 DIAGNOSIS — Z803 Family history of malignant neoplasm of breast: Secondary | ICD-10-CM | POA: Diagnosis not present

## 2024-01-17 DIAGNOSIS — Z923 Personal history of irradiation: Secondary | ICD-10-CM | POA: Diagnosis not present

## 2024-01-17 DIAGNOSIS — Z9011 Acquired absence of right breast and nipple: Secondary | ICD-10-CM | POA: Insufficient documentation

## 2024-01-17 DIAGNOSIS — D649 Anemia, unspecified: Secondary | ICD-10-CM | POA: Diagnosis not present

## 2024-01-17 LAB — CMP (CANCER CENTER ONLY)
ALT: 21 U/L (ref 0–44)
AST: 34 U/L (ref 15–41)
Albumin: 4.4 g/dL (ref 3.5–5.0)
Alkaline Phosphatase: 88 U/L (ref 38–126)
Anion gap: 12 (ref 5–15)
BUN: 31 mg/dL — ABNORMAL HIGH (ref 8–23)
CO2: 25 mmol/L (ref 22–32)
Calcium: 10 mg/dL (ref 8.9–10.3)
Chloride: 100 mmol/L (ref 98–111)
Creatinine: 1.95 mg/dL — ABNORMAL HIGH (ref 0.44–1.00)
GFR, Estimated: 25 mL/min — ABNORMAL LOW (ref >60)
Glucose, Bld: 108 mg/dL — ABNORMAL HIGH (ref 70–99)
Potassium: 4.7 mmol/L (ref 3.5–5.1)
Sodium: 137 mmol/L (ref 135–145)
Total Bilirubin: 0.7 mg/dL (ref 0.0–1.2)
Total Protein: 7.8 g/dL (ref 6.5–8.1)

## 2024-01-17 LAB — CBC WITH DIFFERENTIAL (CANCER CENTER ONLY)
Abs Immature Granulocytes: 11.8 K/uL — ABNORMAL HIGH (ref 0.00–0.07)
Basophils Absolute: 2.5 K/uL — ABNORMAL HIGH (ref 0.0–0.1)
Basophils Relative: 6 %
Eosinophils Absolute: 1.1 K/uL — ABNORMAL HIGH (ref 0.0–0.5)
Eosinophils Relative: 3 %
HCT: 38.7 % (ref 36.0–46.0)
Hemoglobin: 12.7 g/dL (ref 12.0–15.0)
Immature Granulocytes: 28 %
Lymphocytes Relative: 16 %
Lymphs Abs: 6.8 K/uL — ABNORMAL HIGH (ref 0.7–4.0)
MCH: 28.2 pg (ref 26.0–34.0)
MCHC: 32.8 g/dL (ref 30.0–36.0)
MCV: 85.8 fL (ref 80.0–100.0)
Monocytes Absolute: 3 K/uL — ABNORMAL HIGH (ref 0.1–1.0)
Monocytes Relative: 7 %
Neutro Abs: 17.1 K/uL — ABNORMAL HIGH (ref 1.7–7.7)
Neutrophils Relative %: 40 %
Platelet Count: 318 K/uL (ref 150–400)
RBC: 4.51 MIL/uL (ref 3.87–5.11)
RDW: 17.3 % — ABNORMAL HIGH (ref 11.5–15.5)
Smear Review: NORMAL
WBC Count: 42.4 K/uL — ABNORMAL HIGH (ref 4.0–10.5)
nRBC: 0.3 % — ABNORMAL HIGH (ref 0.0–0.2)

## 2024-01-17 LAB — URIC ACID: Uric Acid, Serum: 6.6 mg/dL (ref 2.5–7.1)

## 2024-01-17 MED ORDER — DASATINIB 100 MG PO TABS: 100.0000 mg | ORAL_TABLET | Freq: Every day | ORAL | Status: DC

## 2024-01-17 NOTE — Telephone Encounter (Signed)
 Oral Oncology Patient Advocate Encounter   Began application for assistance for Sprycel through [employer].   Application will be submitted upon completion of necessary supporting documentation.   [employer] phone number : [phone].   Mingo Siegert (Patty) Chet Burnet, CPhT  [facility], Zelda Salmon, [facility] - Oral Chemotherapy Patient Advocate Specialist III Phone: [phone]  Fax: [phone]

## 2024-01-17 NOTE — Progress Notes (Signed)
 Patient would like to know how blood work results are. Otherwise, no new or acute concerns at this time.

## 2024-01-17 NOTE — Telephone Encounter (Addendum)
 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  CBC w/ Diff and CMP from 01/03/24 assessed, noted patient with Scr of 1.81 mg/dL (CrCl ~76 mL/min) - per package insert, no renal dose adjustments are required for Sprycel. Prescription dose and frequency assessed for appropriateness.  Current medication list in Epic reviewed, DDIs with Sprycel identified: Category D drug-drug interaction between Sprycel and Acetaminophen  due to concomitant hepatotoxicity risk when used together. Noted patient only using PRN and LFTs are WNL. No changes in therapy warranted at this time. Category C drug-drug interaction between Sprycel and Aspirin - Sprycel may increase antiplatelet effects of ASA leading to increased risk of brusing/bleeding. Patient's Hgb and pltc currently stable - will discuss with patient monitoring for increased s/sx of bleeding. No changes in therapy warranted at this time.   Evaluated chart and no patient barriers to medication adherence noted.   Patient agreement for treatment documented in MD note on 01/17/24.  Since patient does not have prescription drug insurance - Oral Chemotherapy clinic will proceed with applying for patient assistance at this time.   Oral Oncology Clinic will continue to follow for insurance authorization, copayment issues, initial counseling and start date.  Asberry Macintosh, PharmD, BCPS, BCOP Hematology/Oncology Clinical Pharmacist [phone] 01/17/2024 11:33 AM

## 2024-01-17 NOTE — Progress Notes (Signed)
 Hematology/Oncology Consult note [facility]  Telephone:([phone] Fax:([phone]  Patient Care Team: Don Lauraine Collar, NP as PCP - General (Internal Medicine) Claudene Larinda Bolder, MD (Inactive) as Referring Physician (Surgery) Georgina Shasta POUR, RN as Oncology Nurse Navigator Melanee Annah BROCKS, MD as Consulting Physician (Oncology)   Name of the patient: Sydney Parks  [id number]  09/13/1940   Date of visit: 01/17/24  Diagnosis- 1.  History of recurrent breast cancer presently in remission 2.  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  She had lumpectomy, postlumpectomy radiation and 5 years of tamoxifen back then.  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  Tumor was T1b N0 M0.  She underwent mastectomy and was started on letrozole  by Dr. Wilder.  She did not require postmastectomy radiation or chemotherapy.  It appears that patient did not complete 5 years of letrozole  and was lost to follow-up.  Patient self palpated a mass along her mastectomy scar which led toLeft breast mammogram as well as right chest wall ultrasound.  Mammogram showed 1.3 cm medial right breast/chest mass which contacts and appears to invade underlying pectoralis muscle.  No abnormal appearing right axillary lymph nodes.  No mammographic appearance of left breast malignancy.   Systemic scans were negative for metastatic disease.  Patient was seen by Dr. Iva at [facility] and underwent definitive excision of the postmastectomy recurrence.  Final pathSurgery showed 1.6 cm grade 2 invasive mammary carcinoma with negative margins.  ER 100% positive, PR 80% positive and HER2 negative.  Patient was also seen by radiation oncology at [facility] and declined adjuvant radiation. She started  letrozole  in April 2024 but stopped subsequently due to intolerance  Patient was seen by NP Tinnie Dawn in August 2025 for evaluation of leukocytosis.Flow cytometry showed leukocytosis with left shifted aberrant granulocytes and monocytes as well as absolute basophilia and eosinophilia concerning for myeloproliferative process.  No increase in peripheral blasts.  BCR-ABL FISH testing was consistent with 71.5% nuclei positive for BCR-ABL gene fusion signals consistent with CML.  Interval history- Sydney Parks is an 83 year old female with chronic myeloid leukemia who presents with elevated white blood cell count and fatigue.  She is experiencing significant fatigue. She has a diagnosis of chronic myeloid leukemia (CML) and has an elevated white blood cell count of 25.5 as of January 03, 2024. A year ago, her white blood cell count was normal at 8, and a month ago, it was 22. No acute blasts were seen in her blood tests, and her platelet count remains normal. She is currently taking ten pills every morning for various conditions, including a diuretic for heart issues.  She has concerns about pain in her armpit, which led to an MRI. The MRI showed no evidence of breast cancer recurrence or chest wall recurrence. She is not currently taking any medication for breast cancer due to intolerance to the pill. Her last mammogram for the left breast was in February 2025, and she is up to date with her mammograms.   ECOG PS- 2 Pain scale- 0   Review of systems- Review of Systems  Constitutional:  Positive for malaise/fatigue. Negative for chills, fever and weight loss.  HENT:  Negative for congestion, ear discharge and nosebleeds.   Eyes:  Negative for blurred vision.  Respiratory:  Negative for cough, hemoptysis, sputum production, shortness of breath and wheezing.   Cardiovascular:  Negative for chest pain, palpitations, orthopnea and claudication.  Gastrointestinal:  Negative for abdominal pain,  blood in stool, constipation, diarrhea, heartburn, melena, nausea and vomiting.  Genitourinary:  Negative for dysuria, flank pain, frequency, hematuria and urgency.  Musculoskeletal:  Negative for back pain, joint pain and myalgias.  Skin:  Negative for rash.  Neurological:  Negative for dizziness, tingling, focal weakness, seizures, weakness and headaches.  Endo/Heme/Allergies:  Does not bruise/bleed easily.  Psychiatric/Behavioral:  Negative for depression and suicidal ideas. The patient does not have insomnia.       Allergies  Allergen Reactions   Alendronate Diarrhea    Other reaction(s): Dizziness, Other (See Comments) Joint pain   Nadolol     Other reaction(s): Dizziness Per patient, Felt like the back of head was going to explode   Statins     Other reaction(s): Muscle Pain Mevacor-myalgias.  Crestor-Just didn't feel right.     Past Medical History:  Diagnosis Date   Arthritis    Breast cancer (HCC) 2000 and 2016   right breast ca twice.  Mastectomy in 2016   Cataract    Diabetes mellitus without complication (HCC)    GERD (gastroesophageal reflux disease)    Hx of migraines    from ages [age]   Hypercholesteremia    Hypertension    Kidney stone on right side    Personal history of radiation therapy 2000   right breast ca   Thyroid disease    Varicose vein of leg      Past Surgical History:  Procedure Laterality Date   BREAST BIOPSY Right 2000   breast ca   BREAST BIOPSY Right 2016   invasive mammary-mastectomy   BREAST BIOPSY Right 05/27/2022   US  RT BREAST BX W LOC DEV 1ST LESION IMG BX SPEC US  GUIDE 05/27/2022 [facility]   BREAST LUMPECTOMY Right 2000   breast ca with rad tx and tamoxifen   CATARACT EXTRACTION W/ INTRAOCULAR LENS  IMPLANT, BILATERAL     MASTECTOMY Right 2016   + complete mastectomy and letrozole  tx   SIMPLE MASTECTOMY WITH AXILLARY SENTINEL NODE BIOPSY Right 08/21/2014   Procedure: SIMPLE MASTECTOMY WITH AXILLARY SENTINEL  NODE BIOPSY;  Surgeon: Unknown Sharps, MD;  Location: [facility] ORS;  Service: General;  Laterality: Right;   TONSILLECTOMY     VARICOSE VEIN SURGERY Left    laser surgery    Social History   Socioeconomic History   Marital status: Married    Spouse name: Not on file   Number of children: Not on file   Years of education: Not on file   Highest education level: Not on file  Occupational History   Not on file  Tobacco Use   Smoking status: Former    Current packs/day: 0.00    Average packs/day: 0.5 packs/day for 10.0 years (5.0 ttl pk-yrs)    Types: Cigarettes    Start date: 08/04/1960    Quit date: 08/05/1970    Years since quitting: 53.4   Smokeless tobacco: Never  Vaping Use   Vaping status: Never Used  Substance and Sexual Activity   Alcohol use: No   Drug use: No   Sexual activity: Yes    Birth control/protection: Post-menopausal  Other Topics Concern   Not on file  Social History Narrative   Not on file   Social Drivers of Corporate investment banker  Strain: Low Risk  (09/14/2023)   Received from [facility]   Overall Financial Resource Strain (CARDIA)    Difficulty of Paying Living Expenses: Not hard at all  Food Insecurity: No Food Insecurity (09/14/2023)   Received from [facility]   Hunger Vital Sign    Within the past 12 months, you worried that your food would run out before you got the money to buy more.: Never true    Within the past 12 months, the food you bought just didn't last and you didn't have money to get more.: Never true  Transportation Needs: No Transportation Needs (09/14/2023)   Received from [facility] - Transportation    In the past 12 months, has lack of transportation kept you from medical appointments or from getting medications?: No    Lack of Transportation (Non-Medical): No  Physical Activity: Not on file  Stress: Not on file  Social Connections: Not on file  Intimate Partner  Violence: Not At Risk (06/01/2022)   Humiliation, Afraid, Rape, and Kick questionnaire    Fear of Current or Ex-Partner: No    Emotionally Abused: No    Physically Abused: No    Sexually Abused: No    Family History  Problem Relation Age of Onset   Osteoarthritis Mother    Heart disease Father    Dementia Sister    Breast cancer Daughter 6     Current Outpatient Medications:    acetaminophen  (TYLENOL ) 500 MG tablet, Take 500 mg by mouth every 6 (six) hours as needed for mild pain., Disp: , Rfl:    allopurinol (ZYLOPRIM) 300 MG tablet, Take 300 mg by mouth daily., Disp: , Rfl:    aspirin EC 81 MG tablet, Take 81 mg by mouth., Disp: , Rfl:    dasatinib (SPRYCEL) 100 MG tablet, Take 1 tablet (100 mg total) by mouth daily., Disp: , Rfl:    furosemide (LASIX) 20 MG tablet, Take by mouth., Disp: , Rfl:    gabapentin (NEURONTIN) 100 MG capsule, Take by mouth., Disp: , Rfl:    hydrochlorothiazide  (HYDRODIURIL ) 12.5 MG tablet, Take by mouth., Disp: , Rfl:    levothyroxine  (SYNTHROID , LEVOTHROID) 100 MCG tablet, Take 100 mcg by mouth daily before breakfast., Disp: , Rfl:    lisinopril  (ZESTRIL ) 20 MG tablet, Take by mouth., Disp: , Rfl:    meclizine (ANTIVERT) 25 MG tablet, Take 25 mg by mouth 3 (three) times daily., Disp: , Rfl:    Melatonin 10 MG TABS, Take 1 tablet by mouth at bedtime., Disp: , Rfl:    metFORMIN  (GLUCOPHAGE ) 500 MG tablet, Take 500 mg by mouth every morning. , Disp: , Rfl:    spironolactone (ALDACTONE) 25 MG tablet, Take 25 mg by mouth daily., Disp: , Rfl:    colchicine 0.6 MG tablet, Take 0.6 mg by mouth as needed. (Patient not taking: Reported on 11/29/2023), Disp: , Rfl:    letrozole  (FEMARA ) 2.5 MG tablet, Take 1 tablet (2.5 mg total) by mouth daily. (Patient not taking: Reported on 11/29/2023), Disp: 30 tablet, Rfl: 3  Physical exam:  Vitals:   01/17/24 1051  BP: (!) 125/51  Pulse: 62  Resp: 18  Temp: (!) 96.3 F (35.7 C)  TempSrc: Tympanic  SpO2: 100%   Weight: 151 lb 4.8 oz (68.6 kg)  Height: 5' 7 (1.702 m)   Physical Exam Cardiovascular:     Rate and Rhythm: Normal rate and regular rhythm.  Heart sounds: Normal heart sounds.  Pulmonary:     Effort: Pulmonary effort is normal.     Breath sounds: Normal breath sounds.  Abdominal:     General: Bowel sounds are normal.     Palpations: Abdomen is soft.     Comments: No palpable splenomegaly  Skin:    General: Skin is warm and dry.  Neurological:     Mental Status: She is alert and oriented to person, place, and time.      I have personally reviewed labs listed below:    Latest Ref Rng & Units 01/17/2024   12:03 PM  CMP  Glucose 70 - 99 mg/dL 891   BUN 8 - 23 mg/dL 31   Creatinine 9.55 - 1.00 mg/dL 8.04   Sodium 864 - 854 mmol/L 137   Potassium 3.5 - 5.1 mmol/L 4.7   Chloride 98 - 111 mmol/L 100   CO2 22 - 32 mmol/L 25   Calcium  8.9 - 10.3 mg/dL 89.9   Total Protein 6.5 - 8.1 g/dL 7.8   Total Bilirubin 0.0 - 1.2 mg/dL 0.7   Alkaline Phos 38 - 126 U/L 88   AST 15 - 41 U/L 34   ALT 0 - 44 U/L 21       Latest Ref Rng & Units 01/17/2024   12:03 PM  CBC  WBC 4.0 - 10.5 K/uL 42.4   Hemoglobin 12.0 - 15.0 g/dL 87.2   Hematocrit 63.9 - 46.0 % 38.7   Platelets 150 - 400 K/uL 318    I have personally reviewed Radiology images listed below: No images are attached to the encounter.  MR BRAIN WO CONTRAST Result Date: 12/26/2023 EXAM: MRI BRAIN WITHOUT CONTRAST 12/22/2023 01:32:17 PM TECHNIQUE: Multiplanar multisequence MRI of the head/brain was performed without the administration of intravenous contrast. COMPARISON: None available. CLINICAL HISTORY: No surg; NKI; c/o dizzines 3-4 months; occasional HA's. Hx breast cancer. 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 Multifocal hyperintense T2-weighted signal within the cerebral white matter, most commonly due to chronic small vessel disease. No acute infarct. No intracranial hemorrhage. No mass. No midline shift.  No hydrocephalus. The sella is unremarkable. Normal flow voids. ORBITS: No acute abnormality. SINUSES AND MASTOIDS: No acute abnormality. BONES AND SOFT TISSUES: Normal marrow signal. No acute soft tissue abnormality. IMPRESSION: 1. No acute intracranial abnormality. 2. 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 3. Multifocal hyperintense T2-weighted signal within the cerebral white matter, most commonly due to chronic small vessel disease. Electronically signed by: Franky Stanford MD 12/26/2023 11:00 PM EDT RP Workstation: [id number]     Assessment and plan- Patient is a 83 y.o. female with history of recurrent ER positive breast cancer presently in remission here for new diagnosis of chronic phase CML here to discuss further management  Chronic phase chronic myeloid leukemia (BCR/ABL-positive) Chronic phase CML confirmed by BCR/ABL-positive FISH testing. Intermediate Sokal risk based on age, normal platelet count, normal spleen size on physical exam and absence of peripheral blasts. No evidence of accelerated or acute phase. Treatment necessary to prevent progression to acute leukemia. Dasatinib chosen due to lower cardiac risk profile compared to nilotinib. - Start dasatinib after insurance approval and medication interaction review.Discussed risks and benefits of Dasatinib including all but not limited to edema, skin rash, nausea vomiting diarrhea low blood counts risk of infections and pleural effusion. - Perform baseline EKG and chest x-ray to assess cardiac and pulmonary status. - Monitor blood work Q2 weeks for the first 2-3 months, then monthly. - Schedule follow-up  appointment in one month. - Provide written information about dasatinib and discuss potential side effects. - Communicate with cardiologist regarding dasatinib initiation. - Perform abdominal ultrasound to assess spleen size. - Will check CBC with differential CMP uric acid, BCR-ABL PCR testing and hepatitis B testing today.  Chronic kidney disease,  unspecified stage Elevated creatinine from 1.1 to 1.8 over the past year, indicating worsening kidney function. - Refer to nephrology for further evaluation and management of kidney function.  Anemia, unspecified Hemoglobin decreased from 13.7 to 11.4 over the past year, indicating mild anemia. Cause not specified, but may be related to CML or other underlying conditions.  Fatigue, possibly related to chronic myeloid leukemia Fatigue present, possibly related to CML. Other potential contributing factors include heart condition and anemia. - Monitor fatigue symptoms after starting dasatinib to assess improvement.   Visit Diagnosis 1. CML (chronic myelocytic leukemia) (HCC)   2. Recurrent breast cancer, right (HCC)      Dr. Annah Skene, MD, MPH [facility] [phone] 01/17/2024 12:47 PM

## 2024-01-18 LAB — HEPATITIS B CORE ANTIBODY, IGM: Hep B C IgM: NONREACTIVE

## 2024-01-18 LAB — HEPATITIS B SURFACE ANTIGEN: Hepatitis B Surface Ag: NONREACTIVE

## 2024-01-18 LAB — HEPATITIS B SURFACE ANTIBODY, QUANTITATIVE: Hep B S AB Quant (Post): 20.2 m[IU]/mL

## 2024-01-19 ENCOUNTER — Other Ambulatory Visit: Payer: Self-pay | Admitting: Oncology

## 2024-01-19 DIAGNOSIS — C9210 Chronic myeloid leukemia, BCR/ABL-positive, not having achieved remission: Secondary | ICD-10-CM

## 2024-01-19 DIAGNOSIS — C50911 Malignant neoplasm of unspecified site of right female breast: Secondary | ICD-10-CM

## 2024-01-19 DIAGNOSIS — R161 Splenomegaly, not elsewhere classified: Secondary | ICD-10-CM

## 2024-01-19 NOTE — Telephone Encounter (Signed)
 Oral Oncology Patient Advocate Encounter  I have obtained the patient's and provider's signatures.  I am waiting to hear back from the patient on what her household gross annual income is.  Once I have the income information I will be able to submit the application.  Sydney Parks (Patty) Chet Burnet, CPhT  [facility], [place], [facility] - Oral Chemotherapy Patient Advocate Specialist III Phone: [phone]  Fax: [phone]

## 2024-01-20 LAB — BCR-ABL1, CML/ALL, PCR, QUANT
E1A2 Transcript: 0.0051 %
Interpretation (BCRAL):: POSITIVE — AB
b2a2 transcript: 2.3234 %
b3a2 transcript: 19.8066 %

## 2024-01-21 NOTE — Telephone Encounter (Signed)
 Oral Oncology Patient Advocate Encounter  I have asked the patient for income information multiple times but she has been wary to provide this information.   I provided her with an explanation as to why this is needed and gave her the phone number to the patient assistance foundation to verify the details.  Without the income information I am not able to submit the application.  I am still waiting on a response from the patient on how she would like to proceed.  Sydney Parks (Patty) Chet Burnet, CPhT  [facility], [place], [facility] - Oral Chemotherapy Patient Advocate Specialist III Phone: [phone]  Fax: [phone]

## 2024-01-24 ENCOUNTER — Telehealth: Payer: Self-pay | Admitting: Pharmacy Technician

## 2024-01-24 ENCOUNTER — Ambulatory Visit: Admitting: Oncology

## 2024-01-24 ENCOUNTER — Encounter: Payer: Self-pay | Admitting: Oncology

## 2024-01-24 NOTE — Telephone Encounter (Signed)
 Encounter opened in error.  Vivien Barretto (Patty) Chet Burnet, CPhT  [facility], [place], [facility] - Oral Chemotherapy Patient Advocate Specialist III Phone: [phone]  Fax: [phone]

## 2024-01-24 NOTE — Telephone Encounter (Signed)
 Oral Oncology Patient Advocate Encounter   Submitted application for assistance for Sprycel to  [employer].   Application submitted via e-fax to [phone]   [employer] Patient [employer] phone number [phone].   I will continue to check the status until final determination.   Selena Swaminathan (Patty) Chet Burnet, CPhT  [facility], [place], [facility] - Oral Chemotherapy Patient Advocate Specialist III Phone: [phone]  Fax: [phone]

## 2024-01-25 NOTE — Telephone Encounter (Signed)
 Patient referred to [facility] for CKD on 01/17/24.  Outbound call; spoke to representative who cannot locate referral in their inbox.  Resent referral; fax confirmation received.  Will keep note open to follow up on (send patient updated my chart message with status).

## 2024-01-26 ENCOUNTER — Ambulatory Visit
Admission: RE | Admit: 2024-01-26 | Discharge: 2024-01-26 | Disposition: A | Discharge status: Home / Self Care | Source: Ambulatory Visit | Attending: Oncology | Admitting: Oncology

## 2024-01-26 DIAGNOSIS — C50911 Malignant neoplasm of unspecified site of right female breast: Secondary | ICD-10-CM | POA: Diagnosis present

## 2024-01-26 DIAGNOSIS — R161 Splenomegaly, not elsewhere classified: Secondary | ICD-10-CM | POA: Diagnosis present

## 2024-01-26 MED ORDER — DASATINIB 100 MG PO TABS: 100.0000 mg | ORAL_TABLET | Freq: Every day | ORAL | 2 refills | Status: DC

## 2024-01-26 NOTE — Addendum Note (Signed)
 Addended by: RODGERS RENAEE SAILOR on: 01/26/2024 05:56 PM   Modules accepted: Orders

## 2024-01-26 NOTE — Telephone Encounter (Signed)
 [facility]

## 2024-01-27 NOTE — Telephone Encounter (Signed)
 Outbound call to check status of referral; spoke to representative who indicated the patient has been scheduled to see them on Thurs 11/6.  No further follow up needed.

## 2024-01-27 NOTE — Telephone Encounter (Deleted)
Patient has been scheduled for infusions

## 2024-01-31 ENCOUNTER — Other Ambulatory Visit

## 2024-01-31 NOTE — Telephone Encounter (Signed)
 Oral Oncology Patient Advocate Encounter  Called to check on the status of the patient's application. Per the representative the application is still under review but we should receive an update either today or tomorrow.  [employer] phone number: [phone]  I will continue to check the status until final determination.  Sydney Parks (Patty) Sydney Parks, CPhT  [facility], Sydney Parks, [facility] - Oral Chemotherapy Patient Advocate Specialist III Phone: [phone]  Fax: [phone]

## 2024-01-31 NOTE — Telephone Encounter (Signed)
 Oral Oncology Patient Advocate Encounter   Received notification that the application for assistance for Sprycel through [employer] has been approved.   [employer] phone number [phone].   Effective dates: 01/31/2024 through 04/05/2024  Medication will be filled at [facility]  I have spoken to the patient.  Sydney Parks (Patty) Chet Burnet, CPhT  [facility], [place], [facility] - Oral Chemotherapy Patient Advocate Specialist III Phone: [phone]  Fax: [phone]

## 2024-02-02 ENCOUNTER — Telehealth: Payer: Self-pay

## 2024-02-02 NOTE — Telephone Encounter (Signed)
 PGY2 Oncology Pharmacy Resident Encounter - Oral Chemo Initial Patient Education  I spoke with patient by phone for overview of new oral chemotherapy medication: Sprycel (dasatinib) for the treatment of CML, planned duration until disease progression or unacceptable drug toxicity.   Planned Start Date: Today or tomorrow, patient is expecting to receive the medication by mail today. Treatment regimen: Take 1 tablet (100 mg) by mouth daily Treatment intent: Control  Patient Education Counseled patient on administration, dosing, side effects, monitoring, drug-food interactions, safe handling, storage, and disposal. Reviewed with patient importance of keeping a medication schedule and plan for any missed doses. Supportive care and prophylaxis needs were discussed (if applicable).  Relevant drug-drug interactions were discussed (if applicable).  Advise effects include but are not limited to:  Decrease platelets Patient might have increased bleeding while on this medication (6%). Advised to monitoring for increased bruising. If they notice any blood in the toilet or wounds that don't stop bleeding to let their provider know. Informed the patient it is ok to continue taking the ASA, but may increase risk or bruising and bleeding (patients taking ASA were included in the clinical trial) Decrease white count Patient might have increased risk of infection while on this medication (1% febrile neutropenia). Advised them to call the cancer center if they experience a fever of 100.56F. Recommended they avoid taking Tylenol  if they have a fever unless directed to by their provider.  Decrease hemoglobin Patient might have increased fatigue while on this medication (8%). Advised the patient to rest if needed, but to let their provider know if it negative impacts their everyday activities. Fluid retention Patient might have increased fluid retention while on this medication (23%). Advised patient might need to  use their Lasix more often, but if this is the case to let their provider know. Let their provider know if they start of have SOB or trouble controlling their increased fluid retention.  Headache Patient might experience headaches while on this medication (12%). Advised patient can take tylenol  if needed (labs will be monitored for LFTs). Make sure before taking they don't have an fever.  Diarrhea Patient might experience diarrhea while on this medication (18%). Advised they can take OTC loperamide following the instructions on the bottle. They need to let their provider know if they continue to have > 4 loose stools above baseline.  Patient should avoid taking H2RA and PPIs while on this medication. They can take TUMS if needed for GERD but need to separate it from the medication by 2 hours.   Patient Understanding and Adherence After discussion with patient no patient barriers to medication adherence identified.  Patient voiced understanding and appreciation.  All questions answered. Medication handout provided/mailed.  Communication and Learning Assessment Primary learner: Evalene Mallow (patient's daughter) Barriers to learning: No barriers Preferred language: English Learning preferences: Listening Reading  Evaluation of Patient Distress Distress thermometer completed during telephone call and reviewed with patient. Due to score (6 - 7), social work is indicated but the referral has not been sent per patient's daughter request. They may want to pursue this in the future, but right now the patient is still deciding how they want to proceed with their treatment plan.   Follow-up and Contact Patient knows when and how to call the office with questions or concerns about non-urgent side effects.  Provided patient with Oral Chemotherapy Navigation Clinic phone number.  Oral Chemotherapy Navigation Clinic will continue to follow.  Thank you for allowing pharmacy to participate in this  patient's care.  Alfonso MARLA Buys, PharmD Pharmacy Resident  02/02/2024 11:56 AM

## 2024-02-02 NOTE — Telephone Encounter (Signed)
 Per pt's daughter med to be delivered today 02/02/24 from [facility]

## 2024-02-06 ENCOUNTER — Encounter: Payer: Self-pay | Admitting: Oncology

## 2024-02-07 ENCOUNTER — Telehealth: Payer: Self-pay | Admitting: *Deleted

## 2024-02-07 ENCOUNTER — Other Ambulatory Visit

## 2024-02-07 DIAGNOSIS — R197 Diarrhea, unspecified: Secondary | ICD-10-CM

## 2024-02-07 DIAGNOSIS — C9210 Chronic myeloid leukemia, BCR/ABL-positive, not having achieved remission: Secondary | ICD-10-CM

## 2024-02-07 NOTE — Telephone Encounter (Addendum)
 Patient's daughter, medical POA Evalene [phone]), called requesting appt be made with [facility] today.

## 2024-02-07 NOTE — Telephone Encounter (Signed)
 Spoke with patient's daughter Evalene, she reports that her mom started taking her loperamide yesterday and is has helped to slow down the diarrhea. She has also been encouraging her mom to stay hydrated during this time.

## 2024-02-07 NOTE — Telephone Encounter (Signed)
 Spoke with daughter Sydney Parks to f/u on mychart visit.  Family reports that pt has had 5 loose stools alone today. Pt advised to use imodium for symptom control.  [facility] apt offered today, but family declined. Pt has apt tomorrow for labs at 11am. Pt would like to be seen in [facility] after this apt. Family agreed to have patient come earlier at 1030 am and pt can see Morna, NP at 1045.- msg sent to scheduling to add pt.  Of another note-Per daughter Patient has been extremely emotional since on Sprycel.

## 2024-02-07 NOTE — Telephone Encounter (Signed)
 Sydney Parks can you please reach out to her since she is on Gleevec?  I am okay with reducing the dose of Gleevec as well to see if she would tolerate that better

## 2024-02-07 NOTE — Telephone Encounter (Signed)
 Spoke to daughter Tawni reviewed that diarrhea nor emotional lability are common reported side effects of Sprycel.  Asked how patient is doing today; daughter indicated that she will contact her mother around 10:00am - 10:30am (she usually lets them sleep in a little more).  Advised to have mother start Imodium for diarrhea; encouraged hydration and the use of supplemental drinks (Boost, Ensure, et cetera) in the instance her appetite is not there much.  Also informed if her condition as worsened since yesterday to give us  a call at the center so she can be seen by symptom management.  If just having slight diarrhea to start Imodium and continue to keep us  posted how she's doing.  Daughter verbalized understanding.

## 2024-02-08 ENCOUNTER — Inpatient Hospital Stay

## 2024-02-08 ENCOUNTER — Other Ambulatory Visit: Payer: Self-pay

## 2024-02-08 ENCOUNTER — Inpatient Hospital Stay: Attending: Oncology

## 2024-02-08 ENCOUNTER — Inpatient Hospital Stay
Admission: EM | Admit: 2024-02-08 | Discharge: 2024-02-10 | DRG: 683 | Disposition: A | Discharge status: Home / Self Care | Attending: Obstetrics and Gynecology | Admitting: Obstetrics and Gynecology

## 2024-02-08 ENCOUNTER — Emergency Department

## 2024-02-08 ENCOUNTER — Encounter: Payer: Self-pay | Admitting: Oncology

## 2024-02-08 ENCOUNTER — Inpatient Hospital Stay: Admitting: Nurse Practitioner

## 2024-02-08 VITALS — BP 130/36 | HR 58 | Temp 98.0°F | Ht 66.0 in | Wt 154.0 lb

## 2024-02-08 DIAGNOSIS — R197 Diarrhea, unspecified: Secondary | ICD-10-CM

## 2024-02-08 DIAGNOSIS — Z79890 Hormone replacement therapy (postmenopausal): Secondary | ICD-10-CM

## 2024-02-08 DIAGNOSIS — E1122 Type 2 diabetes mellitus with diabetic chronic kidney disease: Secondary | ICD-10-CM | POA: Diagnosis present

## 2024-02-08 DIAGNOSIS — E871 Hypo-osmolality and hyponatremia: Secondary | ICD-10-CM

## 2024-02-08 DIAGNOSIS — Z7984 Long term (current) use of oral hypoglycemic drugs: Secondary | ICD-10-CM

## 2024-02-08 DIAGNOSIS — I129 Hypertensive chronic kidney disease with stage 1 through stage 4 chronic kidney disease, or unspecified chronic kidney disease: Secondary | ICD-10-CM | POA: Diagnosis present

## 2024-02-08 DIAGNOSIS — N179 Acute kidney failure, unspecified: Secondary | ICD-10-CM

## 2024-02-08 DIAGNOSIS — I10 Essential (primary) hypertension: Secondary | ICD-10-CM | POA: Diagnosis present

## 2024-02-08 DIAGNOSIS — Z87891 Personal history of nicotine dependence: Secondary | ICD-10-CM | POA: Insufficient documentation

## 2024-02-08 DIAGNOSIS — Z888 Allergy status to other drugs, medicaments and biological substances status: Secondary | ICD-10-CM

## 2024-02-08 DIAGNOSIS — N189 Chronic kidney disease, unspecified: Secondary | ICD-10-CM | POA: Diagnosis present

## 2024-02-08 DIAGNOSIS — Z923 Personal history of irradiation: Secondary | ICD-10-CM

## 2024-02-08 DIAGNOSIS — E875 Hyperkalemia: Secondary | ICD-10-CM | POA: Diagnosis present

## 2024-02-08 DIAGNOSIS — Z8249 Family history of ischemic heart disease and other diseases of the circulatory system: Secondary | ICD-10-CM | POA: Diagnosis not present

## 2024-02-08 DIAGNOSIS — R54 Age-related physical debility: Secondary | ICD-10-CM | POA: Diagnosis present

## 2024-02-08 DIAGNOSIS — Z9011 Acquired absence of right breast and nipple: Secondary | ICD-10-CM

## 2024-02-08 DIAGNOSIS — Z853 Personal history of malignant neoplasm of breast: Secondary | ICD-10-CM | POA: Insufficient documentation

## 2024-02-08 DIAGNOSIS — E8721 Acute metabolic acidosis: Secondary | ICD-10-CM | POA: Diagnosis present

## 2024-02-08 DIAGNOSIS — E7800 Pure hypercholesterolemia, unspecified: Secondary | ICD-10-CM | POA: Diagnosis present

## 2024-02-08 DIAGNOSIS — E860 Dehydration: Secondary | ICD-10-CM | POA: Diagnosis present

## 2024-02-08 DIAGNOSIS — C9210 Chronic myeloid leukemia, BCR/ABL-positive, not having achieved remission: Secondary | ICD-10-CM | POA: Insufficient documentation

## 2024-02-08 DIAGNOSIS — K219 Gastro-esophageal reflux disease without esophagitis: Secondary | ICD-10-CM | POA: Diagnosis present

## 2024-02-08 DIAGNOSIS — I872 Venous insufficiency (chronic) (peripheral): Secondary | ICD-10-CM | POA: Diagnosis present

## 2024-02-08 DIAGNOSIS — R531 Weakness: Secondary | ICD-10-CM

## 2024-02-08 DIAGNOSIS — Z79899 Other long term (current) drug therapy: Secondary | ICD-10-CM | POA: Diagnosis not present

## 2024-02-08 DIAGNOSIS — M109 Gout, unspecified: Secondary | ICD-10-CM | POA: Diagnosis present

## 2024-02-08 DIAGNOSIS — E039 Hypothyroidism, unspecified: Secondary | ICD-10-CM | POA: Diagnosis present

## 2024-02-08 DIAGNOSIS — Z803 Family history of malignant neoplasm of breast: Secondary | ICD-10-CM

## 2024-02-08 LAB — BASIC METABOLIC PANEL WITH GFR
Anion gap: 14 (ref 5–15)
BUN: 60 mg/dL — ABNORMAL HIGH (ref 8–23)
CO2: 20 mmol/L — ABNORMAL LOW (ref 22–32)
Calcium: 8.5 mg/dL — ABNORMAL LOW (ref 8.9–10.3)
Chloride: 100 mmol/L (ref 98–111)
Creatinine, Ser: 2.82 mg/dL — ABNORMAL HIGH (ref 0.44–1.00)
GFR, Estimated: 16 mL/min — ABNORMAL LOW (ref >60)
Glucose, Bld: 101 mg/dL — ABNORMAL HIGH (ref 70–99)
Potassium: 4.6 mmol/L (ref 3.5–5.1)
Sodium: 134 mmol/L — ABNORMAL LOW (ref 135–145)

## 2024-02-08 LAB — CMP (CANCER CENTER ONLY)
ALT: 15 U/L (ref 0–44)
AST: 25 U/L (ref 15–41)
Albumin: 3.6 g/dL (ref 3.5–5.0)
Alkaline Phosphatase: 71 U/L (ref 38–126)
Anion gap: 10 (ref 5–15)
BUN: 67 mg/dL — ABNORMAL HIGH (ref 8–23)
CO2: 19 mmol/L — ABNORMAL LOW (ref 22–32)
Calcium: 8.4 mg/dL — ABNORMAL LOW (ref 8.9–10.3)
Chloride: 96 mmol/L — ABNORMAL LOW (ref 98–111)
Creatinine: 3.45 mg/dL — ABNORMAL HIGH (ref 0.44–1.00)
GFR, Estimated: 13 mL/min — ABNORMAL LOW (ref >60)
Glucose, Bld: 102 mg/dL — ABNORMAL HIGH (ref 70–99)
Potassium: 5.2 mmol/L — ABNORMAL HIGH (ref 3.5–5.1)
Sodium: 125 mmol/L — ABNORMAL LOW (ref 135–145)
Total Bilirubin: 0.6 mg/dL (ref 0.0–1.2)
Total Protein: 6.8 g/dL (ref 6.5–8.1)

## 2024-02-08 LAB — CBC WITH DIFFERENTIAL (CANCER CENTER ONLY)
Abs Immature Granulocytes: 9.83 K/uL — ABNORMAL HIGH (ref 0.00–0.07)
Basophils Absolute: 1.5 K/uL — ABNORMAL HIGH (ref 0.0–0.1)
Basophils Relative: 3 %
Eosinophils Absolute: 0.9 K/uL — ABNORMAL HIGH (ref 0.0–0.5)
Eosinophils Relative: 2 %
HCT: 33.2 % — ABNORMAL LOW (ref 36.0–46.0)
Hemoglobin: 11.1 g/dL — ABNORMAL LOW (ref 12.0–15.0)
Immature Granulocytes: 20 %
Lymphocytes Relative: 22 %
Lymphs Abs: 10.8 K/uL — ABNORMAL HIGH (ref 0.7–4.0)
MCH: 28.3 pg (ref 26.0–34.0)
MCHC: 33.4 g/dL (ref 30.0–36.0)
MCV: 84.7 fL (ref 80.0–100.0)
Monocytes Absolute: 2.9 K/uL — ABNORMAL HIGH (ref 0.1–1.0)
Monocytes Relative: 6 %
Neutro Abs: 23.6 K/uL — ABNORMAL HIGH (ref 1.7–7.7)
Neutrophils Relative %: 47 %
Platelet Count: 269 K/uL (ref 150–400)
RBC: 3.92 MIL/uL (ref 3.87–5.11)
RDW: 17.2 % — ABNORMAL HIGH (ref 11.5–15.5)
Smear Review: NORMAL
WBC Count: 49.5 K/uL — ABNORMAL HIGH (ref 4.0–10.5)
nRBC: 0.2 % (ref 0.0–0.2)

## 2024-02-08 LAB — URINALYSIS, ROUTINE W REFLEX MICROSCOPIC
Bilirubin Urine: NEGATIVE
Glucose, UA: NEGATIVE mg/dL
Ketones, ur: NEGATIVE mg/dL
Nitrite: NEGATIVE
Protein, ur: NEGATIVE mg/dL
Specific Gravity, Urine: 1.005 (ref 1.005–1.030)
Squamous Epithelial / HPF: >50 /HPF (ref 0–5)
pH: 5 (ref 5.0–8.0)

## 2024-02-08 LAB — MAGNESIUM
Magnesium: 2.3 mg/dL (ref 1.7–2.4)
Magnesium: 2.3 mg/dL (ref 1.7–2.4)

## 2024-02-08 LAB — SODIUM: Sodium: 131 mmol/L — ABNORMAL LOW (ref 135–145)

## 2024-02-08 LAB — BLOOD GAS, VENOUS: Patient temperature: 37

## 2024-02-08 LAB — SODIUM, URINE, RANDOM: Sodium, Ur: 53 mmol/L

## 2024-02-08 LAB — OSMOLALITY, URINE: Osmolality, Ur: 222 mosm/kg — ABNORMAL LOW (ref 300–900)

## 2024-02-08 MED ORDER — ACETAMINOPHEN 650 MG RE SUPP: 650.0000 mg | Freq: Four times a day (QID) | RECTAL | Status: DC | PRN

## 2024-02-08 MED ORDER — MELATONIN 5 MG PO TABS
5.0000 mg | ORAL_TABLET | Freq: Every day | ORAL | Status: DC
  Administered 2024-02-08: 5 mg via ORAL
  Filled 2024-02-08 (×2): qty 1

## 2024-02-08 MED ORDER — ACETAMINOPHEN 325 MG PO TABS
650.0000 mg | ORAL_TABLET | Freq: Four times a day (QID) | ORAL | Status: DC | PRN
  Administered 2024-02-08 – 2024-02-09 (×2): 650 mg via ORAL
  Filled 2024-02-08 (×2): qty 2

## 2024-02-08 MED ORDER — SODIUM CHLORIDE 0.9 % IV BOLUS
1000.0000 mL | Freq: Once | INTRAVENOUS | Status: AC
  Administered 2024-02-08: 1000 mL via INTRAVENOUS

## 2024-02-08 MED ORDER — HEPARIN SODIUM (PORCINE) 5000 UNIT/ML IJ SOLN
5000.0000 [IU] | Freq: Three times a day (TID) | INTRAMUSCULAR | Status: DC
  Administered 2024-02-08 – 2024-02-10 (×6): 5000 [IU] via SUBCUTANEOUS
  Filled 2024-02-08 (×6): qty 1

## 2024-02-08 MED ORDER — LACTATED RINGERS IV SOLN: INTRAVENOUS | Status: DC

## 2024-02-08 MED ORDER — SODIUM ZIRCONIUM CYCLOSILICATE 10 G PO PACK
10.0000 g | PACK | Freq: Once | ORAL | Status: AC
  Administered 2024-02-08: 10 g via ORAL
  Filled 2024-02-08: qty 1

## 2024-02-08 MED ORDER — SODIUM CHLORIDE 0.9% FLUSH
3.0000 mL | Freq: Two times a day (BID) | INTRAVENOUS | Status: DC
  Administered 2024-02-08 – 2024-02-10 (×4): 3 mL via INTRAVENOUS

## 2024-02-08 MED ORDER — LEVOTHYROXINE SODIUM 100 MCG PO TABS
100.0000 ug | ORAL_TABLET | Freq: Every day | ORAL | Status: DC
  Administered 2024-02-09 – 2024-02-10 (×2): 100 ug via ORAL
  Filled 2024-02-08 (×2): qty 1

## 2024-02-08 NOTE — Progress Notes (Signed)
 Hematology/Oncology Consult note [facility]  Telephone:([phone] [phone]  Patient Care Team: Don Sydney Collar, NP as PCP - General (Internal Medicine) Sydney Larinda Bolder, MD (Inactive) as Referring Physician (Surgery) Sydney Shasta POUR, RN as Oncology Nurse Navigator Sydney Annah BROCKS, MD as Consulting Physician (Oncology) Sydney Annah BROCKS, MD as Consulting Physician (Oncology)   Name of the patient: Sydney Parks  [id number]  1940/11/26   Date of visit: 02/08/24  Diagnosis- 1.  History of recurrent breast cancer presently in remission 2.  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  She had lumpectomy, postlumpectomy radiation and 5 years of tamoxifen back then.  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  Tumor was T1b N0 M0.  She underwent mastectomy and was started on letrozole  by Dr. Wilder.  She did not require postmastectomy radiation or chemotherapy.  It appears that patient did not complete 5 years of letrozole  and was lost to follow-up.  Patient self palpated a mass along her mastectomy scar which led toLeft breast mammogram as well as right chest wall ultrasound.  Mammogram showed 1.3 cm medial right breast/chest mass which contacts and appears to invade underlying pectoralis muscle.  No abnormal appearing right axillary lymph nodes.  No mammographic appearance of left breast malignancy.   Systemic scans were negative for metastatic disease.  Patient was seen by Dr. Iva at [facility] and underwent definitive excision of the postmastectomy recurrence.  Final pathSurgery showed 1.6 cm grade 2 invasive mammary carcinoma with negative margins.  ER 100%  positive, PR 80% positive and HER2 negative.  Patient was also seen by radiation oncology at [facility] and declined adjuvant radiation. She started letrozole  in April 2024 but stopped subsequently due to intolerance  Patient was seen by NP Sydney Parks in August 2025 for evaluation of leukocytosis.Flow cytometry showed leukocytosis with left shifted aberrant granulocytes and monocytes as well as absolute basophilia and eosinophilia concerning for myeloproliferative process.  No increase in peripheral blasts.  BCR-ABL FISH testing was consistent with 71.5% nuclei positive for BCR-ABL gene fusion signals consistent with CML.  Interval history- Sydney Parks presented today in the clinic in a wheelchair with her daughter.  She reports feeling extremely weak.  She did start on Sprycel on 1029 for her CML.  Daughter reports since starting Sprycel she has experienced 5-6 watery stools per day.  She reports spending most of her time in bed with a very poor appetite and extreme weakness.  They had called into the clinic yesterday to report diarrhea at that time they started using Imodium in which they report diarrhea did start to slow and she has not had any watery diarrhea today.  During my visit today she is very tearful, she reports I thought this pill was going to make me feel better.  CBC, CMP, magnesium were all completed in clinic today.  I did review this case with Dr. Melanee as well as Isaiah our specialty pharmacist.  Patient reports some mild dizziness, appears to be dry mucous membranes are dry.  She denies any chest pain no fluid retention no shortness of breath.   ECOG PS- 2 Pain scale- 0   Review of systems- Review of Systems  Constitutional:  Positive for malaise/fatigue and weight loss. Negative for chills and fever.  HENT:  Negative for congestion, ear discharge and nosebleeds.   Eyes:  Negative for blurred vision.  Respiratory:  Negative for cough, hemoptysis, sputum production, shortness of  breath and wheezing.   Cardiovascular:  Negative for chest pain, palpitations, orthopnea and claudication.  Gastrointestinal:  Positive for diarrhea. Negative for abdominal pain, blood in stool, constipation, heartburn, melena, nausea and vomiting.  Genitourinary:  Negative for dysuria, flank pain, frequency, hematuria and urgency.  Musculoskeletal:  Negative for back pain, joint pain and myalgias.  Skin:  Negative for rash.  Neurological:  Negative for dizziness, tingling, focal weakness, seizures, weakness and headaches.  Endo/Heme/Allergies:  Does not bruise/bleed easily.  Psychiatric/Behavioral:  Negative for depression and suicidal ideas. The patient does not have insomnia.       Allergies  Allergen Reactions   Alendronate Diarrhea    Other reaction(s): Dizziness, Other (See Comments) Joint pain   Nadolol     Other reaction(s): Dizziness Per patient, Felt like the back of head was going to explode   Statins     Other reaction(s): Muscle Pain Mevacor-myalgias.  Crestor-Just didn't feel right.     Past Medical History:  Diagnosis Date   Arthritis    Breast cancer (HCC) 2000 and 2016   right breast ca twice.  Mastectomy in 2016   Cataract    Diabetes mellitus without complication (HCC)    GERD (gastroesophageal reflux disease)    Hx of migraines    from ages [age]   Hypercholesteremia    Hypertension    Kidney stone on right side    Personal history of radiation therapy 2000   right breast ca   Thyroid disease    Varicose vein of leg      Past Surgical History:  Procedure Laterality Date   BREAST BIOPSY Right 2000   breast ca   BREAST BIOPSY Right 2016   invasive mammary-mastectomy   BREAST BIOPSY Right 05/27/2022   US  RT BREAST BX W LOC DEV 1ST LESION IMG BX SPEC US  GUIDE 05/27/2022 [facility]   BREAST LUMPECTOMY Right 2000   breast ca with rad tx and tamoxifen   CATARACT EXTRACTION W/ INTRAOCULAR LENS  IMPLANT, BILATERAL     MASTECTOMY Right 2016    + complete mastectomy and letrozole  tx   SIMPLE MASTECTOMY WITH AXILLARY SENTINEL NODE BIOPSY Right 08/21/2014   Procedure: SIMPLE MASTECTOMY WITH AXILLARY SENTINEL NODE BIOPSY;  Surgeon: Unknown Sharps, MD;  Location: [facility] ORS;  Service: General;  Laterality: Right;   TONSILLECTOMY     VARICOSE VEIN SURGERY Left    laser surgery    Social History   Socioeconomic History   Marital status: Married    Spouse name: Not on file   Number of children: Not on file   Years of education: Not on file   Highest education level: Not on file  Occupational History   Not on file  Tobacco Use   Smoking status: Former    Current packs/day: 0.00    Average packs/day: 0.5 packs/day for 10.0 years (5.0 ttl pk-yrs)    Types: Cigarettes    Start date: 08/04/1960    Quit date: 08/05/1970    Years since quitting: 53.5   Smokeless tobacco: Never  Vaping Use   Vaping status: Never Used  Substance and Sexual Activity   Alcohol use: No   Drug use: No   Sexual activity: Yes    Birth control/protection: Post-menopausal  Other Topics Concern   Not on file  Social History Narrative   Not on file   Social Drivers of Health   Financial Resource Strain: Low Risk  (09/14/2023)   Received from [facility]   Overall Financial Resource Strain (CARDIA)    Difficulty of Paying Living Expenses: Not hard at all  Food Insecurity: No Food Insecurity (09/14/2023)   Received from [facility]   Hunger Vital Sign    Within the past 12 months, you worried that your food would run out before you got the money to buy more.: Never true    Within the past 12 months, the food you bought just didn't last and you didn't have money to get more.: Never true  Transportation Needs: No Transportation Needs (09/14/2023)   Received from [facility] - Transportation    In the past 12 months, has lack of transportation kept you from medical appointments or from getting  medications?: No    Lack of Transportation (Non-Medical): No  Physical Activity: Not on file  Stress: Not on file  Social Connections: Not on file  Intimate Partner Violence: Not At Risk (06/01/2022)   Humiliation, Afraid, Rape, and Kick questionnaire    Fear of Current or Ex-Partner: No    Emotionally Abused: No    Physically Abused: No    Sexually Abused: No    Family History  Problem Relation Age of Onset   Osteoarthritis Mother    Heart disease Father    Dementia Sister    Breast cancer Daughter 28     Current Outpatient Medications:    acetaminophen  (TYLENOL ) 500 MG tablet, Take 500 mg by mouth every 6 (six) hours as needed for mild pain., Disp: , Rfl:    allopurinol (ZYLOPRIM) 300 MG tablet, Take 300 mg by mouth daily., Disp: , Rfl:    aspirin EC 81 MG tablet, Take 81 mg by mouth., Disp: , Rfl:    dasatinib (SPRYCEL) 100 MG tablet, Take 1 tablet (100 mg total) by mouth daily., Disp: 30 tablet, Rfl: 2   furosemide (LASIX) 20 MG tablet, Take by mouth., Disp: , Rfl:    gabapentin (NEURONTIN) 100 MG capsule, Take by mouth., Disp: , Rfl:    hydrochlorothiazide  (HYDRODIURIL ) 12.5 MG tablet, Take by mouth., Disp: , Rfl:    levothyroxine  (SYNTHROID , LEVOTHROID) 100 MCG tablet, Take 100 mcg by mouth daily before breakfast., Disp: , Rfl:    lisinopril  (ZESTRIL ) 20 MG tablet, Take by mouth., Disp: , Rfl:    meclizine (ANTIVERT) 25 MG tablet, Take 25 mg by mouth 3 (three) times daily., Disp: , Rfl:    Melatonin 10 MG TABS, Take 1 tablet by mouth at bedtime., Disp: , Rfl:    metFORMIN  (GLUCOPHAGE ) 500 MG tablet, Take 500 mg by mouth every morning. , Disp: , Rfl:    spironolactone (ALDACTONE) 25 MG tablet, Take 25 mg by mouth daily., Disp: , Rfl:    colchicine 0.6 MG tablet, Take 0.6 mg by mouth as needed. (Patient not taking: Reported on 02/08/2024), Disp: , Rfl:    letrozole  (FEMARA ) 2.5 MG tablet, Take 1 tablet (2.5 mg total) by mouth daily. (Patient not taking: Reported on 02/08/2024),  Disp: 30 tablet, Rfl: 3  Physical exam:  Vitals:   02/08/24 1054  BP: (!) 130/36  Pulse: (!) 58  Temp: 98 F (36.7 C)  TempSrc: Oral  SpO2: 100%  Weight: 154 lb (69.9 kg)  Height: 5' 6 (  1.676 m)   Physical Exam Constitutional:      Appearance: She is ill-appearing.  HENT:     Mouth/Throat:     Comments: Dry mouth Cardiovascular:     Rate and Rhythm: Normal rate and regular rhythm.     Heart sounds: Normal heart sounds.  Pulmonary:     Effort: Pulmonary effort is normal.     Breath sounds: Normal breath sounds.  Abdominal:     General: Bowel sounds are normal.     Palpations: Abdomen is soft.     Comments: No palpable splenomegaly  Skin:    General: Skin is warm and dry.  Neurological:     Mental Status: She is alert and oriented to person, place, and time.  Psychiatric:     Comments: Tearful during visit      I have personally reviewed labs listed below:    Latest Ref Rng & Units 02/08/2024   10:41 AM  CMP  Glucose 70 - 99 mg/dL 897   BUN 8 - 23 mg/dL 67   Creatinine 9.55 - 1.00 mg/dL 6.54   Sodium 864 - 854 mmol/L 125   Potassium 3.5 - 5.1 mmol/L 5.2   Chloride 98 - 111 mmol/L 96   CO2 22 - 32 mmol/L 19   Calcium  8.9 - 10.3 mg/dL 8.4   Total Protein 6.5 - 8.1 g/dL 6.8   Total Bilirubin 0.0 - 1.2 mg/dL 0.6   Alkaline Phos 38 - 126 U/L 71   AST 15 - 41 U/L 25   ALT 0 - 44 U/L 15       Latest Ref Rng & Units 02/08/2024   10:41 AM  CBC  WBC 4.0 - 10.5 K/uL 49.5   Hemoglobin 12.0 - 15.0 g/dL 88.8   Hematocrit 63.9 - 46.0 % 33.2   Platelets 150 - 400 K/uL 269    I have personally reviewed Radiology images listed below: No images are attached to the encounter.  US  SPLEEN (ABDOMEN LIMITED) Result Date: 01/29/2024 EXAM: Right Upper Quadrant Abdominal Ultrasound 01/26/2024 01:59:48 PM TECHNIQUE: Real-time ultrasonography of the right upper quadrant of the abdomen was performed. COMPARISON: None available. CLINICAL HISTORY: spleomegaly, breast cancer.  FINDINGS: SPLEEN: The spleen measures 12.7 x 12.6 x 4.9 cm, volume 410 ml. IMPRESSION: 1. Spleen measures 12.7 x 12.6 x 4.9 cm, volume 410 ml, consistent with splenomegaly. Electronically signed by: Greig Pique MD 01/29/2024 12:41 AM EDT RP Workstation: [id number]   DG Chest 2 View Result Date: 01/18/2024 CLINICAL DATA:  Breast cancer EXAM: CHEST - 2 VIEW COMPARISON:  None Available. FINDINGS: The heart size and mediastinal contours are within normal limits. Both lungs are clear. The visualized skeletal structures are unremarkable. IMPRESSION: No acute abnormality of the lungs. Electronically Signed   By: Marolyn JONETTA Jaksch M.D.   On: 01/18/2024 09:48     Assessment and plan-  Diarrhea: improved today with imodium use secondary to sprycel, hold sprycel send to ED for rehydration and management  Acute renal failure secondary to diarrhea:  Hold sprycel, send to ED for aggressive rehydration and renal ultrasound Creatinine 3.45, BUN 67  Hyponatremia: likely secondary to dehydration/diarrhea.  Na 125 send to ED for repletion and rehydration   Hyperkalemia: likely secondary to acute renal failure K 5.2 send to ED for rehydration and further evaluation and management  Call report to Triage nurse Morna in ED at [facility] discussed acute renal failure, labs, and current medication regimen and diarrhea  Plan: Send to ED due  to acute renal failure Stop Sprycel F/U inpatient post admission        Morna Husband [facility] [phone] 02/08/2024 11:32 AM

## 2024-02-08 NOTE — ED Triage Notes (Signed)
 First Nurse Note: Patient to ED from the Cancer [facility] for acute kidney failure due to new medication- sprycel that was started on 10/29. Since Saturday having lack of appetite and diarrhea. Potassium- 5.2, Cre 3.45, Na 125

## 2024-02-08 NOTE — ED Triage Notes (Signed)
 See first nurse note. Pt here POV with daughters from cancer center. Pt has lab results in system from 1041 today.

## 2024-02-08 NOTE — ED Provider Notes (Signed)
 [facility] Provider Note    Event Date/Time   First MD Initiated Contact with Patient 02/08/24 1156     (approximate)   History   abnormal labs   HPI  Sydney Parks is a 83 y.o. female who was recently started on new medication Sprycel on 10/29 has been having increasing diarrhea and lack of appetite so sent to the emergency room due to abnormal blood work at the cancer center.  Patient's potassium was 5.2 creatinine 3.45 and sodium of 125   I reviewed the note today from the oncology office.  She does get a history of prior breast cancer.  She is followed by them for concerns for elevated leukocytosis with signs of CML.  Because of this she was started on a new medication Dasatinib   Patient does report that she did have a fall 2 days ago where she did hit her head.  She did not lose consciousness.  She reports it was mechanical in nature.  She denies any neck pain.  She reports otherwise feeling in her normal self since then without any headaches or confusion.  They do report that since starting this new medication that she has been having significant amount of weakness not been wanting to eat and drink.  Physical Exam   Triage Vital Signs: ED Triage Vitals  Encounter Vitals Group     BP 02/08/24 1143 (!) 137/33     Girls Systolic BP Percentile --      Girls Diastolic BP Percentile --      Boys Systolic BP Percentile --      Boys Diastolic BP Percentile --      Pulse Rate 02/08/24 1143 (!) 59     Resp 02/08/24 1143 16     Temp 02/08/24 1143 98.4 F (36.9 C)     Temp Source 02/08/24 1143 Oral     SpO2 02/08/24 1143 100 %     Weight 02/08/24 1143 154 lb (69.9 kg)     Height 02/08/24 1143 5' 6 (1.676 m)     Head Circumference --      Peak Flow --      Pain Score 02/08/24 1147 0     Pain Loc --      Pain Education --      Exclude from Growth Chart --     Most recent vital signs: Vitals:   02/08/24 1143  BP: (!) 137/33  Pulse: (!) 59   Resp: 16  Temp: 98.4 F (36.9 C)  SpO2: 100%     General: Awake, no distress.  CV:  Good peripheral perfusion.  Resp:  Normal effort.  Abd:  No distention.  Other:  No chest wall tenderness no abdominal tenderness able to lift up both legs up off the bed.  No obvious hematoma to the head noted No C-spine tenderness with full range of motion of neck without any numbness, tingling.  ED Results / Procedures / Treatments   Labs (all labs ordered are listed, but only abnormal results are displayed) Labs Reviewed  URINALYSIS, ROUTINE W REFLEX MICROSCOPIC - Abnormal; Notable for the following components:      Result Value   Color, Urine YELLOW (*)    APPearance CLOUDY (*)    Hgb urine dipstick SMALL (*)    Leukocytes,Ua SMALL (*)    Bacteria, UA FEW (*)    All other components within normal limits     EKG  My interpretation of EKG:  Normal sinus rate of 60 without any ST elevation or T wave versions, normal intervals  RADIOLOGY I have reviewed the ct personally and interpreted    PROCEDURES:  Critical Care performed: No  Procedures   MEDICATIONS ORDERED IN ED: Medications - No data to display   IMPRESSION / MDM / ASSESSMENT AND PLAN / ED COURSE  I reviewed the triage vital signs and the nursing notes.   Patient's presentation is most consistent with acute presentation with potential threat to life or bodily function.  Patient comes in with weakness suspect related to medication as she reports increasing fatigue decreased p.o. intake.  Patient's blood work was reviewed which shows AKI.  However patient does also report a fall 2 days ago and hitting her head she is good no evidence of cervical injury as she has no pain or tenderness full range of motion of neck and no numbness or tingling but we discussed out of precaution doing CT head to make sure no evidence of intercranial hemorrhage with a would like to proceed with.  She denies any chest pain or shortness of  breath to suggest ACS.  At she has no infectious symptoms although her white count is elevated it is chronically elevated.  She does not meet sepsis criteria otherwise.   Blood work was reviewed where patient had normal magnesium.  CBC shows creatinine of 3.45 low bicarb of 19 and potassium of 5.2.  CBC shows elevated white count of 49  Awaiting final read of CT scan but I do not see evidence of intracranial hemorrhage urine without evidence of UTI.  Given patient's weakness with AKI will discuss with hospital team for admission  The patient is on the cardiac monitor to evaluate for evidence of arrhythmia and/or significant heart rate changes.      FINAL CLINICAL IMPRESSION(S) / ED DIAGNOSES   Final diagnoses:  AKI (acute kidney injury)  CML (chronic myelocytic leukemia) (HCC)  Weakness     Rx / DC Orders   ED Discharge Orders     None        Note:  This document was prepared using Dragon voice recognition software and may include unintentional dictation errors.   Ernest Ronal BRAVO, MD 02/08/24 1450

## 2024-02-08 NOTE — ED Notes (Signed)
 Called CCMD to place pt on monitoring

## 2024-02-08 NOTE — ED Notes (Signed)
 Patient ambulatory to bathroom and back to room with stand by assist.

## 2024-02-08 NOTE — H&P (Addendum)
 History and Physical    Sydney Parks FMW:[id number] DOB: 09/03/1940 DOA: 02/08/2024  DOS: the patient was seen and examined on 02/08/2024  PCP: Don Lauraine Collar, NP   Patient coming from: Home  I have personally briefly reviewed patient's old medical records in [facility] and CareEverywhere  HPI:   Sydney Parks is a 83 y.o. 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   Pt states she has been doing well last week but was started on Sprycel on Thursday and developed diarrhea since taking it. She states this occurred on the same day as she took the medication and pt reports few hours of prolonged diarrheal episode after taking this medication. She denies any nausea or vomiting or abdominal pain. She denies any fevers or chills.    On arrival to the ED patient was noted to be HDS stable.  EDP reviewed lab work done at the cancer center and ordered a UA.  Given hyponatremia at 125, EDP ordered 1 L NS.  TRH was then consulted for admission.   Review of Systems: As mentioned in the history of present illness. All other systems reviewed and are negative.   Past Medical History:  Diagnosis Date   Arthritis    Breast cancer (HCC) 2000 and 2016   right breast ca twice.  Mastectomy in 2016   Cataract    Diabetes mellitus without complication (HCC)    GERD (gastroesophageal reflux disease)    Hx of migraines    from ages [age]   Hypercholesteremia    Hypertension    Kidney stone on right side    Personal history of radiation therapy 2000   right breast ca   Thyroid disease    Varicose vein of leg     Past Surgical History:  Procedure Laterality Date   BREAST BIOPSY Right 2000   breast ca   BREAST BIOPSY Right 2016   invasive mammary-mastectomy    BREAST BIOPSY Right 05/27/2022   US  RT BREAST BX W LOC DEV 1ST LESION IMG BX SPEC US  GUIDE 05/27/2022 [facility]   BREAST LUMPECTOMY Right 2000   breast ca with rad tx and tamoxifen   CATARACT EXTRACTION W/ INTRAOCULAR LENS  IMPLANT, BILATERAL     MASTECTOMY Right 2016   + complete mastectomy and letrozole  tx   SIMPLE MASTECTOMY WITH AXILLARY SENTINEL NODE BIOPSY Right 08/21/2014   Procedure: SIMPLE MASTECTOMY WITH AXILLARY SENTINEL NODE BIOPSY;  Surgeon: Unknown Sharps, MD;  Location: [facility] ORS;  Service: General;  Laterality: Right;   TONSILLECTOMY     VARICOSE VEIN SURGERY Left    laser surgery     Allergies  Allergen Reactions   Alendronate Diarrhea    Other reaction(s): Dizziness, Other (See Comments) Joint pain   Nadolol     Other reaction(s): Dizziness Per patient, Felt like the back of head was going to explode   Statins     Other reaction(s): Muscle Pain Mevacor-myalgias.  Crestor-Just didn't feel right.    Family History  Problem Relation Age of Onset   Osteoarthritis Mother    Heart disease Father    Dementia Sister    Breast cancer Daughter 27    Prior to Admission medications   Medication Sig Start Date End Date Taking? Authorizing Provider  acetaminophen  (  TYLENOL ) 500 MG tablet Take 500 mg by mouth every 6 (six) hours as needed for mild pain.   Yes [provider]  allopurinol (ZYLOPRIM) 300 MG tablet Take 300 mg by mouth daily. 09/09/21  Yes [provider]  aspirin EC 81 MG tablet Take 81 mg by mouth. 12/07/23  Yes [provider]  dasatinib (SPRYCEL) 100 MG tablet Take 1 tablet (100 mg total) by mouth daily. 01/26/24  Yes Melanee Annah BROCKS, MD  furosemide (LASIX) 20 MG tablet Take 20 mg by mouth daily. 05/06/22  Yes [provider]  gabapentin (NEURONTIN) 100 MG capsule Take 200 mg by mouth 2 (two) times daily.   Yes [provider]  hydrochlorothiazide  (HYDRODIURIL ) 12.5 MG tablet Take 12.5 mg by mouth daily.  05/18/23 05/17/24 Yes [provider]  levothyroxine  (SYNTHROID , LEVOTHROID) 100 MCG tablet Take 100 mcg by mouth daily before breakfast.   Yes [provider]  lisinopril  (ZESTRIL ) 40 MG tablet Take 40 mg by mouth daily. 05/06/22  Yes [provider]  meclizine (ANTIVERT) 25 MG tablet Take 25 mg by mouth 3 (three) times daily. 12/15/23  Yes [provider]  Melatonin 10 MG TABS Take 1 tablet by mouth at bedtime.   Yes [provider]  metFORMIN  (GLUCOPHAGE ) 500 MG tablet Take 500 mg by mouth every morning.  07/03/14  Yes [provider]  spironolactone (ALDACTONE) 25 MG tablet Take 25 mg by mouth daily. 10/22/23 10/21/24 Yes [provider]  colchicine 0.6 MG tablet Take 0.6 mg by mouth as needed. Patient not taking: No sig reported 07/01/21   [provider]  letrozole  (FEMARA ) 2.5 MG tablet Take 1 tablet (2.5 mg total) by mouth daily. Patient not taking: No sig reported 06/01/22   Melanee Annah BROCKS, MD    Social History:  reports that she quit smoking about 53 years ago. Her smoking use included cigarettes. She started smoking about 63 years ago. She has a 5 pack-year smoking history. She has never used smokeless tobacco. She reports that she does not drink alcohol and does not use drugs. Lives with husband and son.  Tobacco- Very remote use of smoking for 6 years. EtOH- Denies use.  Illicit drug use- denies use.  IADLs/ADLs- can perform independently at baseline    Physical Exam: Vitals:   02/08/24 1530 02/08/24 1600 02/08/24 1612 02/08/24 1630  BP: (!) 125/40 (!) 139/52  (!) 141/46  Pulse: (!) 59 61  (!) 137  Resp: 12 13  17   Temp:   97.7 F (36.5 C)   TempSrc:   Oral   SpO2: 100% 100%  98%  Weight:      Height:         Gen: NAD HENT: Dry MM CV: Sinus rate and rhythm, good radial pulse Resp: CTAB Abd: No TTP MSK: No symmetry Skin: No lesions noted on examined skin Neuro: Alert and oriented x 4 Psych:  Pleasant mood   Labs on Admission: I have personally reviewed following labs and imaging studies  CBC: Recent Labs  Lab 02/08/24 1041  WBC 49.5*  NEUTROABS 23.6*  HGB 11.1*  HCT 33.2*  MCV 84.7  PLT 269   Basic Metabolic Panel: Recent Labs  Lab 02/08/24 1041  NA 125*  K 5.2*  CL 96*  CO2 19*  GLUCOSE 102*  BUN 67*  CREATININE 3.45*  CALCIUM  8.4*  MG 2.3   GFR: Estimated Creatinine Clearance: 11.6 mL/min (A) (by C-G formula based on SCr of 3.45  mg/dL (H)). Liver Function Tests: Recent Labs  Lab 02/08/24 1041  AST 25  ALT 15  ALKPHOS 71  BILITOT 0.6  PROT 6.8  ALBUMIN 3.6   No results for input(s): LIPASE, AMYLASE in the last 168 hours. No results for input(s): AMMONIA in the last 168 hours. Coagulation Profile: No results for input(s): INR, PROTIME in the last 168 hours. Cardiac Enzymes: No results for input(s): CKTOTAL, CKMB, CKMBINDEX, TROPONINI, TROPONINIHS in the last 168 hours. BNP (last 3 results) No results for input(s): BNP in the last 8760 hours. HbA1C: No results for input(s): HGBA1C in the last 72 hours. CBG: No results for input(s): GLUCAP in the last 168 hours. Lipid Profile: No results for input(s): CHOL, HDL, LDLCALC, TRIG, CHOLHDL, LDLDIRECT in the last 72 hours. Thyroid Function Tests: No results for input(s): TSH, T4TOTAL, FREET4, T3FREE, THYROIDAB in the last 72 hours. Anemia Panel: No results for input(s): VITAMINB12, FOLATE, FERRITIN, TIBC, IRON, RETICCTPCT in the last 72 hours. Urine analysis:    Component Value Date/Time   COLORURINE YELLOW (A) 02/08/2024 1251   APPEARANCEUR CLOUDY (A) 02/08/2024 1251   LABSPEC 1.005 02/08/2024 1251   PHURINE 5.0 02/08/2024 1251   GLUCOSEU NEGATIVE 02/08/2024 1251   HGBUR SMALL (A) 02/08/2024 1251   BILIRUBINUR NEGATIVE 02/08/2024 1251   KETONESUR NEGATIVE 02/08/2024 1251   PROTEINUR NEGATIVE 02/08/2024 1251   NITRITE NEGATIVE  02/08/2024 1251   LEUKOCYTESUR SMALL (A) 02/08/2024 1251    Radiological Exams on Admission: I have personally reviewed images CT HEAD WO CONTRAST ( ) Result Date: 02/08/2024 CLINICAL DATA:  Minor head trauma. EXAM: CT HEAD WITHOUT CONTRAST TECHNIQUE: Contiguous axial images were obtained from the base of the skull through the vertex without intravenous contrast. RADIATION DOSE REDUCTION: This exam was performed according to the departmental dose-optimization program which includes automated exposure control, adjustment of the mA and/or kV according to patient size and/or use of iterative reconstruction technique. COMPARISON:  MRI brain 12/22/2023 FINDINGS: Brain: Ventricles, cisterns and other CSF spaces are within normal. Evidence of minimal chronic ischemic microvascular disease. 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 No mass, mass effect, shift of midline structures or acute hemorrhage. Vascular: No hyperdense vessel or unexpected calcification. Skull: Normal. Negative for fracture or focal lesion. Sinuses/Orbits: Visualized orbits are normal. Paranasal sinuses are well aerated. There are hypoplastic frontal sinuses. Other: None. IMPRESSION: 1. No acute findings. 2. 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 Electronically Signed   By: Toribio Agreste M.D.   On: 02/08/2024 15:18    EKG: My personal interpretation of EKG shows: Sinus rhythm without any acute ST changes.  No evidence of hyperkalemia on EKG.   Assessment/Plan Principal Problem:   AKI (acute kidney injury) Active Problems:   Hypertension   Hypothyroidism   Gout   CML (chronic myelocytic leukemia) (HCC)   Hyponatremia   Hyperkalemia   Patient with CML recently started on imatinib with side effect of diarrhea which is a common side effect presented with weakness and found to have significant electrolyte derangements along with a significant AKI.  Patient is status post 1 L IVF.   Holding further fluids until repeat sodium can be performed.  Less risk of overcorrection as hyponatremia is moderate at 125.  My goal would be to slowly correct.  The AKI was also due to concomitant use of multiple antihypertensives including 3 diuretics.  Urine studies ordered.  Renal ultrasound ordered.  Trend daily creatinine, renally dose meds, avoid nephrotoxic agents.  Hyponatremia:  Secondary to poor oral intake, AKI and diarrhea.  On admission sodium was 125.  Patient status post 1 L.  Will repeat sodium level and change fluids accordingly.  Goal is 131-133.  But risk of overcorrection is less given the moderate nature of hyponatremia.  Urine studies ordered.  Unable to add to previous specimen given insufficient specimen.  Hyperkalemia: Secondary to AKI.  1 dose of Lokelma given.  No ECG changes noted.  Will repeat BMP in the p.m. to reassess renal function and hyperkalemia.   CML: Recently diagnosed and started on imatinib.  Holding further with treatment and will consult oncology.  Appreciate their recommendations.  Hypertension: Holding home meds.  Hypothyroidism: Continue home Synthroid .  Hyperlipidemia: Not on statin.  Will defer initiation or continuation of this to outpatient given her age.  Type 2 diabetes mellitus: Monitor CBGs.  Holding home metformin .  Gout: Holding home meds currently.  Restart when AKI resolves.  May need to be renally dosed given concern for CKD as well.  Recurrent breast cancer: In remission.  Patient discussed with oncologist not to pursue antiestrogen therapy.  VTE prophylaxis:  SQ Heparin  Diet: Regular Code Status:  Full Code Telemetry:  Admission status: Inpatient, Progressive Patient is from: Home Anticipated d/c is to: Home Anticipated d/c is in: 2-3 days   Family Communication: Updated at bedside, updated daughter Evalene. Please update daughter daily.   Consults called: Oncology   Severity of Illness: The appropriate patient  status for this patient is INPATIENT. Inpatient status is judged to be reasonable and necessary in order to provide the required intensity of service to ensure the patient's safety. The patient's presenting symptoms, physical exam findings, and initial radiographic and laboratory data in the context of their chronic comorbidities is felt to place them at high risk for further clinical deterioration. Furthermore, it is not anticipated that the patient will be medically stable for discharge from the hospital within 2 midnights of admission.   * I certify that at the point of admission it is my clinical judgment that the patient will require inpatient hospital care spanning beyond 2 midnights from the point of admission due to high intensity of service, high risk for further deterioration and high frequency of surveillance required.DEWAINE Morene Bathe, MD Jolynn DEL. [facility]

## 2024-02-09 DIAGNOSIS — N179 Acute kidney failure, unspecified: Secondary | ICD-10-CM | POA: Diagnosis not present

## 2024-02-09 LAB — BASIC METABOLIC PANEL WITH GFR
Anion gap: 9 (ref 5–15)
BUN: 56 mg/dL — ABNORMAL HIGH (ref 8–23)
CO2: 18 mmol/L — ABNORMAL LOW (ref 22–32)
Calcium: 8.1 mg/dL — ABNORMAL LOW (ref 8.9–10.3)
Chloride: 105 mmol/L (ref 98–111)
Creatinine, Ser: 2.54 mg/dL — ABNORMAL HIGH (ref 0.44–1.00)
GFR, Estimated: 18 mL/min — ABNORMAL LOW (ref >60)
Glucose, Bld: 138 mg/dL — ABNORMAL HIGH (ref 70–99)
Potassium: 4.9 mmol/L (ref 3.5–5.1)
Sodium: 132 mmol/L — ABNORMAL LOW (ref 135–145)

## 2024-02-09 LAB — CBC
HCT: 28.2 % — ABNORMAL LOW (ref 36.0–46.0)
Hemoglobin: 9.4 g/dL — ABNORMAL LOW (ref 12.0–15.0)
MCH: 28.3 pg (ref 26.0–34.0)
MCHC: 33.3 g/dL (ref 30.0–36.0)
MCV: 84.9 fL (ref 80.0–100.0)
Platelets: 230 K/uL (ref 150–400)
RBC: 3.32 MIL/uL — ABNORMAL LOW (ref 3.87–5.11)
RDW: 17.2 % — ABNORMAL HIGH (ref 11.5–15.5)
WBC: 22.4 K/uL — ABNORMAL HIGH (ref 4.0–10.5)
nRBC: 0.2 % (ref 0.0–0.2)

## 2024-02-09 LAB — SODIUM
Sodium: 133 mmol/L — ABNORMAL LOW (ref 135–145)
Sodium: 133 mmol/L — ABNORMAL LOW (ref 135–145)

## 2024-02-09 LAB — BLOOD GAS, VENOUS
Bicarbonate: 21.2 mmol/L — AB (ref 20.0–28.0)
Patient temperature: 37 mmol/L — AB (ref 0.0–2.0)
pCO2, Ven: 43 mmHg — ABNORMAL LOW (ref 44–60)
pH, Ven: 7.3 (ref 7.25–7.43)
pO2, Ven: 21.2 mmHg (ref 32–45)

## 2024-02-09 LAB — GLUCOSE, CAPILLARY
Glucose-Capillary: 84 mg/dL (ref 70–99)
Glucose-Capillary: 94 mg/dL (ref 70–99)
Glucose-Capillary: 126 mg/dL — ABNORMAL HIGH (ref 70–99)

## 2024-02-09 MED ORDER — LOPERAMIDE HCL 2 MG PO CAPS
2.0000 mg | ORAL_CAPSULE | ORAL | Status: DC | PRN
Start: 2024-02-09 — End: 2024-02-10
  Administered 2024-02-10: 2 mg via ORAL
  Filled 2024-02-09: qty 1

## 2024-02-09 MED ORDER — ORAL CARE MOUTH RINSE
15.0000 mL | OROMUCOSAL | Status: DC | PRN
Start: 2024-02-09 — End: 2024-02-10

## 2024-02-09 MED ORDER — HYDRALAZINE HCL 20 MG/ML IJ SOLN: 10.0000 mg | Freq: Four times a day (QID) | INTRAMUSCULAR | Status: DC | PRN

## 2024-02-09 MED ORDER — SODIUM CHLORIDE 0.9 % IV SOLN: INTRAVENOUS | Status: DC

## 2024-02-09 NOTE — Plan of Care (Signed)

## 2024-02-09 NOTE — Evaluation (Signed)
 Physical Therapy Evaluation Patient Details Name: Sydney Parks MRN: [id number] DOB: April 29, 1940 Today's Date: 02/09/2024  History of Present Illness  Sydney Parks is a 83 y.o. 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   Clinical Impression  Pt admitted with above diagnosis. Pt currently with functional limitations due to the deficits listed below (see PT Problem List). Pt received reclined in bed agreeable to PT with daughter at bedside. 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 Pt lives with family and has 24/7 support/assist as needed. Lab entering room beginning of session needing to complete blood draw prior to PT eval.   To date, pt requiring minA for bed mobility with min multi modal cuing for UE use on railings to assist. Pt with mild dizziness reported requiring minA+1 for STS to RW with VC's for hand placement. Pt with initial posterior lean but corrects with education and CGA. Pt ambulating in room 40' using RW at supervision level. Slow and steady cadence noted but no LOB. Able to stand statically at window for 2-3 minutes at RW prior to reports needing seated rest in recliner due to fatigue and nausea. Upon sitting education provided on d/c options, DME needs and benefits of DME for reduced falls risk and energy conservation. Pt and family in agreement to POC. Pt will benefit from skilled PT services to address acute weakness to maximize return to PLOF.       If plan is discharge home, recommend the following: A little help with walking and/or transfers;Assistance with cooking/housework;Assist for transportation;Help with stairs or ramp for entrance;A little help with bathing/dressing/bathroom   Can travel by private  vehicle        Equipment Recommendations Rolling walker (2 wheels)  Recommendations for Other Services       Functional Status Assessment Patient has had a recent decline in their functional status and demonstrates the ability to make significant improvements in function in a reasonable and predictable amount of time.     Precautions / Restrictions Precautions Precautions: Fall Recall of Precautions/Restrictions: Intact Restrictions Weight Bearing Restrictions Per Provider Order: No      Mobility  Bed Mobility Overal bed mobility: Needs Assistance Bed Mobility: Supine to Sit     Supine to sit: Min assist, HOB elevated, Used rails       Patient Response: Cooperative  Transfers Overall transfer level: Needs assistance Equipment used: Rolling walker (2 wheels) Transfers: Sit to/from Stand Sit to Stand: Min assist           General transfer comment: bouts of momentum. VC's for hand placement.    Ambulation/Gait Ambulation/Gait assistance: Supervision Gait Distance (Feet): 40 Feet Assistive device: Rolling walker (2 wheels) Gait Pattern/deviations: Step-through pattern, Decreased step length - right, Decreased step length - left       General Gait Details: slow and steady cadence with correct use of RW.  Stairs            Wheelchair Mobility     Tilt Bed Tilt Bed Patient Response: Cooperative  Modified Rankin (Stroke Patients Only)       Balance Overall balance assessment: Needs assistance Sitting-balance support: Bilateral upper extremity supported, Feet supported Sitting balance-Leahy Scale: Fair     Standing balance support: Bilateral upper  extremity supported, During functional activity, Reliant on assistive device for balance Standing balance-Leahy Scale: Fair Standing balance comment: reliant on RW                             Pertinent Vitals/Pain Pain Assessment Pain Assessment: No/denies pain    Home Living  Family/patient expects to be discharged to:: Private residence Living Arrangements: Spouse/significant other;Other relatives;Children Available Help at Discharge: Family;Available 24 hours/day Type of Home: House Home Access: Stairs to enter Entrance Stairs-Rails: Right;Left;Can reach both Entrance Stairs-Number of Steps: 2 from garage   Home Layout: One level Home Equipment: BSC/3in1 Additional Comments: spouse has PD and utilizes RW, upright walker.    Prior Function Prior Level of Function : Independent/Modified Independent;Driving             Mobility Comments: caregiver for spouse. ADLs Comments: Typically drives short errands but family has been assisting with driving tasks lately.     Extremity/Trunk Assessment   Upper Extremity Assessment Upper Extremity Assessment: Defer to OT evaluation    Lower Extremity Assessment Lower Extremity Assessment: Generalized weakness       Communication   Communication Communication: No apparent difficulties    Cognition Arousal: Alert Behavior During Therapy: Lability   PT - Cognitive impairments: No apparent impairments                       PT - Cognition Comments: Per daughter at bedside, was mildly confused about her location when woken up by her but successfully re-oriented. Following commands: Intact       Cueing Cueing Techniques: Verbal cues     General Comments General comments (skin integrity, edema, etc.): VSS at rest and post ambulation.    Exercises Other Exercises Other Exercises: Role of PT in acute setting, d/c recs, safe use of DME, use of DME for reduced falls risk and energy conservation.   Assessment/Plan    PT Assessment Patient needs continued PT services  PT Problem List Decreased strength;Decreased activity tolerance;Decreased balance;Decreased mobility       PT Treatment Interventions DME instruction;Gait training;Patient/family education;Stair training;Functional mobility  training;Therapeutic activities;Therapeutic exercise;Balance training;Neuromuscular re-education    PT Goals (Current goals can be found in the Care Plan section)  Acute Rehab PT Goals Patient Stated Goal: to improve strength, return home with family support PT Goal Formulation: With patient Time For Goal Achievement: 02/23/24 Potential to Achieve Goals: Good    Frequency Min 3X/week     Co-evaluation               AM-PAC PT 6 Clicks Mobility  Outcome Measure Help needed turning from your back to your side while in a flat bed without using bedrails?: A Little Help needed moving from lying on your back to sitting on the side of a flat bed without using bedrails?: A Little Help needed moving to and from a bed to a chair (including a wheelchair)?: A Little Help needed standing up from a chair using your arms (e.g., wheelchair or bedside chair)?: A Little Help needed to walk in hospital room?: A Little Help needed climbing 3-5 steps with a railing? : A Lot 6 Click Score: 17    End of Session Equipment Utilized During Treatment: Gait belt Activity Tolerance: Patient limited by fatigue Patient left: in chair;with call bell/phone within reach;with family/visitor present Nurse Communication: Mobility status PT Visit Diagnosis: Other abnormalities of gait and mobility (R26.89);Muscle weakness (generalized) (  M62.81);Difficulty in walking, not elsewhere classified (R26.2)    Time: 9085-9052 PT Time Calculation (min) (ACUTE ONLY): 33 min   Charges:   PT Evaluation $PT Eval Moderate Complexity: 1 Mod PT Treatments $Therapeutic Activity: 8-22 mins PT General Charges $$ ACUTE PT VISIT: 1 Visit        Dorina HERO. Fairly IV, PT, DPT Physical Therapist- [place]  [facility] 02/09/2024, 10:32 AM

## 2024-02-09 NOTE — Progress Notes (Signed)
 OT Cancellation Note  Patient Details Name: Sydney Parks MRN: [id number] DOB: 1940/07/27   Cancelled Treatment:    Reason Eval/Treat Not Completed: Other (comment). Order received, chart reviewed. On x2 attempts pt with MD at bed side engaged in family / pt discussion. Will hold and re-attempt to initiate services as pt available.   Dhwani Venkatesh, M.S. OTR/L  02/09/24, 4:00 PM  ascom [phone]

## 2024-02-09 NOTE — Progress Notes (Addendum)
 PROGRESS NOTE    Sydney Parks  FMW:[id number] DOB: 1940/08/28 DOA: 02/08/2024 PCP: Don Lauraine Collar, NP    Brief Narrative:  Sydney Parks is a 83 y.o. 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   Pt states she has been doing well last week but was started on Sprycel on Thursday and developed diarrhea since taking it. She states this occurred on the same day as she took the medication and pt reports few hours of prolonged diarrheal episode after taking this medication. She denies any nausea or vomiting or abdominal pain. She denies any fevers or chills.    On arrival to the ED patient was noted to be HDS stable.  EDP reviewed lab work done at the cancer center and ordered a UA.  Given hyponatremia at 125, EDP ordered 1 L NS.  TRH was then consulted for admission.   Assessment and Plan:  # AKI - Patient presented with Cr 3.45, BUN 67, baseline Cr ~ 1.8-1.95 - Etiology likely dehydration secondary to diarrhea and decreased PO intake secondary to initiation of imatinib in the setting of ongoing Lasix, spironolactone, hydrochlorothiazide , and lisinopril  use - Renal ultrasound unremarkable  - Cr improved to 2.54 with IVFs - Continue IVFs for 1 day  # Hyperkalemia - In the setting of AKI and initiation of imatinib  - K elevated to 5.2 on admission - Received Lokelma 10 g once  - Improved to 4.9  # Hypotonic Hyponatremia - Presented with a Na of 125 - Likely multifactorial in the setting of AKI, dehydration, decreased PO intake - Improved with IVFs - Na 133 - Continue IVFs  # Diarrhea, POA - Onset coincided with initiation of imatinib therapy for CML but was improving prior to admission on Loperamide - PRN loperamide ordered  # CML - Follows with Dr. Melanee  with Oncology - Was recently started on imatinib therapy, first dose 10/30, but did not tolerate it. Has since stopped.  - Oncology consulted, Dr. Melanee indicated she will see the patient   # Hypothyroidism - Continue home Synthroid   # Primary hypertension - Home antihypertensives lisinopril , spironolactone, hydrochlorothiazide , and lasix held due to AKI - PRN IV hydralazine 10 mg ordered - Will resume as AKI resolves  # History of recurrent breast cancer in remission - In remission - Follows with oncology  # Gout - Home allopurinol held due to renal function decline   DVT prophylaxis: heparin injection 5,000 Units Start: 02/08/24 1600  Code Status:   Code Status: Full Code Family Communication: Discussed extensively with patient's two daughters at bedside, all questions and concerns addressed Disposition Plan: Pending PT/OT eval Level of care: Progressive  Consultants:  Oncology  Procedures:  None  Antimicrobials: None    Subjective: Sydney Parks was seen at bedside today. Her two daughters were at bedside. No acute events overnight. She reports fatigue and lethargy throughout the day. Has continued to have decreased PO intake due to poor appetite. Denies chest pain, SOB, fevers, chills, nausea, vomiting, abdominal pain.   Objective: Vitals:   02/09/24 1138 02/09/24 1400 02/09/24 1545 02/09/24 1637  BP: (!) 151/49  (!) 131/37 (!) 138/35  Pulse: 72  73 67  Resp:   14   Temp: 100 F (37.8 C) 99.4 F (37.4 C) 99.2 F (  37.3 C)   TempSrc: Oral     SpO2: 100%  99%   Weight:      Height:        Intake/Output Summary (Last 24 hours) at 02/09/2024 1813 Last data filed at 02/09/2024 1002 Gross per 24 hour  Intake 840 ml  Output --  Net 840 ml   Filed Weights   02/08/24 1143  Weight: 69.9 kg    Examination:  General exam: Appears calm and comfortable  Respiratory system: Clear to auscultation. Respiratory effort normal. Cardiovascular system: S1 & S2 heard, RRR.  No JVD, murmurs, rubs, gallops or clicks. No pedal edema. Gastrointestinal system: Abdomen is nondistended, soft and nontender. No organomegaly or masses felt. Normal bowel sounds heard. Central nervous system: Alert and oriented. No focal neurological deficits. Extremities: Symmetric 5 x 5 power. Skin: No rashes, lesions or ulcers Psychiatry: Judgement and insight appear normal. Mood & affect appropriate.     Data Reviewed: I have personally reviewed following labs and imaging studies  CBC: Recent Labs  Lab 02/08/24 1041 02/09/24 0526  WBC 49.5* 22.4*  NEUTROABS 23.6*  --   HGB 11.1* 9.4*  HCT 33.2* 28.2*  MCV 84.7 84.9  PLT 269 230   Basic Metabolic Panel: Recent Labs  Lab 02/08/24 1041 02/08/24 1621 02/08/24 2239 02/09/24 0526 02/09/24 0930 02/09/24 1520  NA 125* 131* 134* 132* 133* 133*  K 5.2*  --  4.6 4.9  --   --   CL 96*  --  100 105  --   --   CO2 19*  --  20* 18*  --   --   GLUCOSE 102*  --  101* 138*  --   --   BUN 67*  --  60* 56*  --   --   CREATININE 3.45*  --  2.82* 2.54*  --   --   CALCIUM  8.4*  --  8.5* 8.1*  --   --   MG 2.3 2.3  --   --   --   --    GFR: Estimated Creatinine Clearance: 15.7 mL/min (A) (by C-G formula based on SCr of 2.54 mg/dL (H)). Liver Function Tests: Recent Labs  Lab 02/08/24 1041  AST 25  ALT 15  ALKPHOS 71  BILITOT 0.6  PROT 6.8  ALBUMIN 3.6   No results for input(s): LIPASE, AMYLASE in the last 168 hours. No results for input(s): AMMONIA in the last 168 hours. Coagulation Profile: No results for input(s): INR, PROTIME in the last 168 hours. Cardiac Enzymes: No results for input(s): CKTOTAL, CKMB, CKMBINDEX, TROPONINI in the last 168 hours. BNP (last 3 results) No results for input(s): PROBNP in the last 8760 hours. HbA1C: No results for input(s): HGBA1C in the last 72 hours. CBG: Recent Labs  Lab 02/09/24 0800 02/09/24 1138 02/09/24 1638  GLUCAP 84 94 126*   Lipid Profile: No  results for input(s): CHOL, HDL, LDLCALC, TRIG, CHOLHDL, LDLDIRECT in the last 72 hours. Thyroid Function Tests: No results for input(s): TSH, T4TOTAL, FREET4, T3FREE, THYROIDAB in the last 72 hours. Anemia Panel: No results for input(s): VITAMINB12, FOLATE, FERRITIN, TIBC, IRON, RETICCTPCT in the last 72 hours. Sepsis Labs: No results for input(s): PROCALCITON, LATICACIDVEN in the last 168 hours.  No results found for this or any previous visit (from the past 240 hours).       Radiology Studies: US  RENAL Result Date: 02/08/2024 CLINICAL DATA:  Acute kidney injury. EXAM: RENAL / URINARY TRACT ULTRASOUND COMPLETE COMPARISON:  None Available. FINDINGS: Right Kidney: Renal measurements: 8.6 cm x 4.0 cm x 5.1 cm = volume: 91 mL. There is increased echogenicity of the renal parenchyma. A right-sided extrarenal pelvis is noted. No mass or hydronephrosis visualized. Left Kidney: Renal measurements: 9.9 cm x 5.0 cm x 5.1 cm = volume: 131 mL. There is increased echogenicity of the renal parenchyma. No mass or hydronephrosis visualized. Bladder: Appears normal for degree of bladder distention. The bilateral ureteral jets are visualized. Other: None. IMPRESSION: Bilateral echogenic kidneys which likely represents sequelae associated with medical renal disease. Electronically Signed   By: Suzen Dials M.D.   On: 02/08/2024 17:52   CT HEAD WO CONTRAST ( ) Result Date: 02/08/2024 CLINICAL DATA:  Minor head trauma. EXAM: CT HEAD WITHOUT CONTRAST TECHNIQUE: Contiguous axial images were obtained from the base of the skull through the vertex without intravenous contrast. RADIATION DOSE REDUCTION: This exam was performed according to the departmental dose-optimization program which includes automated exposure control, adjustment of the mA and/or kV according to patient size and/or use of iterative reconstruction technique. COMPARISON:  MRI brain 12/22/2023 FINDINGS:  Brain: Ventricles, cisterns and other CSF spaces are within normal. Evidence of minimal chronic ischemic microvascular disease. 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 No mass, mass effect, shift of midline structures or acute hemorrhage. Vascular: No hyperdense vessel or unexpected calcification. Skull: Normal. Negative for fracture or focal lesion. Sinuses/Orbits: Visualized orbits are normal. Paranasal sinuses are well aerated. There are hypoplastic frontal sinuses. Other: None. IMPRESSION: 1. No acute findings. 2. 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 Electronically Signed   By: Toribio Agreste M.D.   On: 02/08/2024 15:18        Scheduled Meds:  heparin  5,000 Units Subcutaneous Q8H   levothyroxine   100 mcg Oral Q0600   melatonin  5 mg Oral QHS   sodium chloride  flush  3 mL Intravenous Q12H   Continuous Infusions:  sodium chloride  75 mL/hr at 02/09/24 1541     LOS: 1 day      Melford Tullier Al-Sultani, MD [facility]   If 7PM-7AM, please contact night-coverage  02/09/2024, 6:13 PM

## 2024-02-10 DIAGNOSIS — N179 Acute kidney failure, unspecified: Secondary | ICD-10-CM | POA: Diagnosis not present

## 2024-02-10 LAB — MAGNESIUM: Magnesium: 2.3 mg/dL (ref 1.7–2.4)

## 2024-02-10 LAB — CBC
HCT: 26.6 % — ABNORMAL LOW (ref 36.0–46.0)
Hemoglobin: 8.8 g/dL — ABNORMAL LOW (ref 12.0–15.0)
MCH: 28.4 pg (ref 26.0–34.0)
MCHC: 33.1 g/dL (ref 30.0–36.0)
MCV: 85.8 fL (ref 80.0–100.0)
Platelets: 219 K/uL (ref 150–400)
RBC: 3.1 MIL/uL — ABNORMAL LOW (ref 3.87–5.11)
RDW: 17.5 % — ABNORMAL HIGH (ref 11.5–15.5)
WBC: 16.1 K/uL — ABNORMAL HIGH (ref 4.0–10.5)
nRBC: 0.4 % — ABNORMAL HIGH (ref 0.0–0.2)

## 2024-02-10 LAB — RENAL FUNCTION PANEL
Albumin: 2.9 g/dL — ABNORMAL LOW (ref 3.5–5.0)
Anion gap: 8 (ref 5–15)
BUN: 43 mg/dL — ABNORMAL HIGH (ref 8–23)
CO2: 20 mmol/L — ABNORMAL LOW (ref 22–32)
Calcium: 8.4 mg/dL — ABNORMAL LOW (ref 8.9–10.3)
Chloride: 110 mmol/L (ref 98–111)
Creatinine, Ser: 2.05 mg/dL — ABNORMAL HIGH (ref 0.44–1.00)
GFR, Estimated: 24 mL/min — ABNORMAL LOW (ref >60)
Glucose, Bld: 84 mg/dL (ref 70–99)
Phosphorus: 4.3 mg/dL (ref 2.5–4.6)
Potassium: 4.3 mmol/L (ref 3.5–5.1)
Sodium: 138 mmol/L (ref 135–145)

## 2024-02-10 LAB — UREA NITROGEN, URINE: Urea Nitrogen, Ur: 217 mg/dL

## 2024-02-10 LAB — GLUCOSE, CAPILLARY: Glucose-Capillary: 69 mg/dL — ABNORMAL LOW (ref 70–99)

## 2024-02-10 NOTE — Discharge Summary (Signed)
 Sydney Parks FMW:[id number] DOB: 1940/09/02 DOA: 02/08/2024  PCP: Don Lauraine Collar, NP  Admit date: 02/08/2024 Discharge date: 02/10/2024  Time spent: 35 minutes  Recommendations for Outpatient Follow-up:  Oncology f/u 1 week, bmp then Pcp f/u 1-2 weeks, attention to blood pressure, ongoing need for diuretic, and glucose control at that time     Discharge Diagnoses:  Principal Problem:   AKI (acute kidney injury) Active Problems:   Hypertension   Hypothyroidism   Gout   CML (chronic myelocytic leukemia) (HCC)   Hyponatremia   Hyperkalemia   Discharge Condition: improved  Diet recommendation: regular  Filed Weights   02/08/24 1143 02/10/24 0500  Weight: 69.9 kg 69.4 kg    History of present illness:  From admission h and p Sydney Parks is a 83 y.o. 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     Pt states she has been doing well last week but was started on Sprycel on Thursday and developed diarrhea since taking it. She states this occurred on the same day as she took the medication and pt reports few hours of prolonged diarrheal episode after taking this medication. She denies any nausea or vomiting or abdominal pain. She denies any fevers or chills.      On arrival to the ED patient was noted to be HDS stable.  EDP reviewed lab work done at the cancer center and ordered a UA.  Given hyponatremia at 125, EDP ordered 1 L NS.  TRH was then consulted for admission.  Hospital Course:   Patient presents with nausea and diarrhea after taking sprycel, this in the setting of continuing home lasix, spiro, hydrochlorothiazide , and lisinopril . Found to have AKI. Diarrhea has mostly resolved. Treated with IV fluids. Kidney function has close to returned to  recent baseline. Feeling much better. Evaluated by PT who advised home health which we have ordered (already has rolling walker). Discharged home with plan to hold home lisinopril , hydrochlorothiazide , furosemide, spironolactone, and metformin . Appears is on diuretics for venous insufficiency, if indeed that's the case then a better approach would probably be compression therapy. Should need for future diuretic arise, would start with one agent and titrate slowly with close monitoring of kidney function. Here low normal glucose so metformin  held, may be appropriate to resume as appetite improves.   Procedures: none   Consultations: oncology  Discharge Exam: Vitals:   02/10/24 0446 02/10/24 0823  BP: (!) 113/36 (!) 134/41  Pulse: (!) 54 61  Resp: 18 16  Temp: 97.7 F (36.5 C) 98 F (36.7 C)  SpO2: 96% 97%    General: NAD Cardiovascular: RRR Respiratory: CTAB Ext: warm, no edema Abdomen: soft, non-tender  Discharge Instructions   Discharge Instructions     Diet - low sodium heart healthy   Complete by: As directed    Increase activity slowly   Complete by: As directed       Allergies as of 02/10/2024       Reactions   Alendronate Diarrhea   Other reaction(s): Dizziness, Other (See Comments) Joint pain   Nadolol    Other reaction(s): Dizziness Per patient, Felt like the back of head was going to explode   Statins    Other reaction(s): Muscle Pain Mevacor-myalgias.  Crestor-Just didn't feel right.  Medication List     PAUSE taking these medications    dasatinib 100 MG tablet Wait to take this until your doctor or other care provider tells you to start again. Commonly known as: SPRYCEL Take 1 tablet (100 mg total) by mouth daily.   furosemide 20 MG tablet Wait to take this until your doctor or other care provider tells you to start again. Commonly known as: LASIX Take 20 mg by mouth daily.   hydrochlorothiazide  12.5 MG tablet Wait to take this  until your doctor or other care provider tells you to start again. Commonly known as: HYDRODIURIL  Take 12.5 mg by mouth daily.   lisinopril  40 MG tablet Wait to take this until your doctor or other care provider tells you to start again. Commonly known as: ZESTRIL  Take 40 mg by mouth daily.   metFORMIN  500 MG tablet Wait to take this until your doctor or other care provider tells you to start again. Commonly known as: GLUCOPHAGE  Take 500 mg by mouth every morning.   spironolactone 25 MG tablet Wait to take this until your doctor or other care provider tells you to start again. Commonly known as: ALDACTONE Take 25 mg by mouth daily.       STOP taking these medications    letrozole  2.5 MG tablet Commonly known as: FEMARA        TAKE these medications    acetaminophen  500 MG tablet Commonly known as: TYLENOL  Take 500 mg by mouth every 6 (six) hours as needed for mild pain.   allopurinol 300 MG tablet Commonly known as: ZYLOPRIM Take 300 mg by mouth daily.   aspirin EC 81 MG tablet Take 81 mg by mouth.   colchicine 0.6 MG tablet Take 0.6 mg by mouth as needed.   gabapentin 100 MG capsule Commonly known as: NEURONTIN Take 200 mg by mouth 2 (two) times daily.   levothyroxine  100 MCG tablet Commonly known as: SYNTHROID  Take 100 mcg by mouth daily before breakfast.   meclizine 25 MG tablet Commonly known as: ANTIVERT Take 25 mg by mouth 3 (three) times daily.   Melatonin 10 MG Tabs Take 1 tablet by mouth at bedtime.       Allergies  Allergen Reactions   Alendronate Diarrhea    Other reaction(s): Dizziness, Other (See Comments) Joint pain   Nadolol     Other reaction(s): Dizziness Per patient, Felt like the back of head was going to explode   Statins     Other reaction(s): Muscle Pain Mevacor-myalgias.  Crestor-Just didn't feel right.    Follow-up Information     Melanee Annah BROCKS, MD Follow up.   Specialty: Oncology Why: next week Contact  information: [address] [place] [address] [phone]         Don Lauraine Collar, NP Follow up.   Specialty: Internal Medicine Contact information: [address] [facility] [place] [address] [phone]         Don Lauraine Collar, NP Follow up.   Specialty: Internal Medicine Why: 1-2 weeks Contact information: [address] [facility] - [facility]IMARY [facility] [place] [address] [phone]                  The results of significant diagnostics from this hospitalization (including imaging, microbiology, ancillary and laboratory) are listed below for reference.    Significant Diagnostic Studies: US  RENAL Result Date: 02/08/2024 CLINICAL DATA:  Acute kidney injury. EXAM: RENAL / URINARY  TRACT ULTRASOUND COMPLETE COMPARISON:  None Available. FINDINGS: Right Kidney: Renal measurements: 8.6 cm x 4.0 cm x 5.1 cm = volume: 91 mL. There is increased echogenicity of the renal parenchyma. A right-sided extrarenal pelvis is noted. No mass or hydronephrosis visualized. Left Kidney: Renal measurements: 9.9 cm x 5.0 cm x 5.1 cm = volume: 131 mL. There is increased echogenicity of the renal parenchyma. No mass or hydronephrosis visualized. Bladder: Appears normal for degree of bladder distention. The bilateral ureteral jets are visualized. Other: None. IMPRESSION: Bilateral echogenic kidneys which likely represents sequelae associated with medical renal disease. Electronically Signed   By: Suzen Dials M.D.   On: 02/08/2024 17:52   CT HEAD WO CONTRAST ( ) Result Date: 02/08/2024 CLINICAL DATA:  Minor head trauma. EXAM: CT HEAD WITHOUT CONTRAST TECHNIQUE: Contiguous axial images were obtained from the base of the skull through the vertex without intravenous contrast. RADIATION DOSE REDUCTION: This exam was performed according to the departmental dose-optimization program which includes automated exposure control,  adjustment of the mA and/or kV according to patient size and/or use of iterative reconstruction technique. COMPARISON:  MRI brain 12/22/2023 FINDINGS: Brain: Ventricles, cisterns and other CSF spaces are within normal. Evidence of minimal chronic ischemic microvascular disease. 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 No mass, mass effect, shift of midline structures or acute hemorrhage. Vascular: No hyperdense vessel or unexpected calcification. Skull: Normal. Negative for fracture or focal lesion. Sinuses/Orbits: Visualized orbits are normal. Paranasal sinuses are well aerated. There are hypoplastic frontal sinuses. Other: None. IMPRESSION: 1. No acute findings. 2. 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 Electronically Signed   By: Toribio Agreste M.D.   On: 02/08/2024 15:18   US  SPLEEN (ABDOMEN LIMITED) Result Date: 01/29/2024 EXAM: Right Upper Quadrant Abdominal Ultrasound 01/26/2024 01:59:48 PM TECHNIQUE: Real-time ultrasonography of the right upper quadrant of the abdomen was performed. COMPARISON: None available. CLINICAL HISTORY: spleomegaly, breast cancer. FINDINGS: SPLEEN: The spleen measures 12.7 x 12.6 x 4.9 cm, volume 410 ml. IMPRESSION: 1. Spleen measures 12.7 x 12.6 x 4.9 cm, volume 410 ml, consistent with splenomegaly. Electronically signed by: Greig Pique MD 01/29/2024 12:41 AM EDT RP Workstation: [id number]   DG Chest 2 View Result Date: 01/18/2024 CLINICAL DATA:  Breast cancer EXAM: CHEST - 2 VIEW COMPARISON:  None Available. FINDINGS: The heart size and mediastinal contours are within normal limits. Both lungs are clear. The visualized skeletal structures are unremarkable. IMPRESSION: No acute abnormality of the lungs. Electronically Signed   By: Marolyn JONETTA Jaksch M.D.   On: 01/18/2024 09:48    Microbiology: No results found for this or any previous visit (from the past 240 hours).   Labs: Basic Metabolic Panel: Recent Labs  Lab  02/08/24 1041 02/08/24 1621 02/08/24 2239 02/09/24 0526 02/09/24 0930 02/09/24 1520 02/10/24 0405  NA 125* 131* 134* 132* 133* 133* 138  K 5.2*  --  4.6 4.9  --   --  4.3  CL 96*  --  100 105  --   --  110  CO2 19*  --  20* 18*  --   --  20*  GLUCOSE 102*  --  101* 138*  --   --  84  BUN 67*  --  60* 56*  --   --  43*  CREATININE 3.45*  --  2.82* 2.54*  --   --  2.05*  CALCIUM  8.4*  --  8.5* 8.1*  --   --  8.4*  MG 2.3 2.3  --   --   --   --  2.3  PHOS  --   --   --   --   --   --  4.3   Liver Function Tests: Recent Labs  Lab 02/08/24 1041 02/10/24 0405  AST 25  --   ALT 15  --   ALKPHOS 71  --   BILITOT 0.6  --   PROT 6.8  --   ALBUMIN 3.6 2.9*   No results for input(s): LIPASE, AMYLASE in the last 168 hours. No results for input(s): AMMONIA in the last 168 hours. CBC: Recent Labs  Lab 02/08/24 1041 02/09/24 0526 02/10/24 0405  WBC 49.5* 22.4* 16.1*  NEUTROABS 23.6*  --   --   HGB 11.1* 9.4* 8.8*  HCT 33.2* 28.2* 26.6*  MCV 84.7 84.9 85.8  PLT 269 230 219   Cardiac Enzymes: No results for input(s): CKTOTAL, CKMB, CKMBINDEX, TROPONINI in the last 168 hours. BNP: BNP (last 3 results) No results for input(s): BNP in the last 8760 hours.  ProBNP (last 3 results) No results for input(s): PROBNP in the last 8760 hours.  CBG: Recent Labs  Lab 02/09/24 0800 02/09/24 1138 02/09/24 1638 02/10/24 0825  GLUCAP 84 94 126* 69*       Signed:  Devaughn KATHEE Ban MD.  [facility] 02/10/2024, 9:21 AM

## 2024-02-10 NOTE — Evaluation (Signed)
 Occupational Therapy Evaluation Patient Details Name: Sydney Parks MRN: [id number] DOB: September 22, 1940 Today's Date: 02/10/2024   History of Present Illness   Sydney Parks is a 83 y.o. 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    Clinical Impressions Sydney Parks was seen for OT evaluation this date. Prior to hospital admission, pt was IND including caregiving for her spouse with Parkinsons (assist for bathing and IADLs). Pt lives in house with 2 STE, daughters available as needed. Pt presents to acute OT demonstrating impaired ADL performance and functional mobility 2/2 decreased activity tolerance. Pt currently requires SUPERVISION for toilet t/f no AD, improves to MOD I with RW use. IND don/doff underwear sitting. Educated pt/family on DME recs, d/c recs, and falls safety. All education complete, will sign off. Upon hospital discharge, recommend no OT follow up.    If plan is discharge home, recommend the following:   Help with stairs or ramp for entrance     Functional Status Assessment   Patient has had a recent decline in their functional status and demonstrates the ability to make significant improvements in function in a reasonable and predictable amount of time.     Equipment Recommendations   None recommended by OT     Recommendations for Other Services         Precautions/Restrictions   Precautions Precautions: None Recall of Precautions/Restrictions: Intact Restrictions Weight Bearing Restrictions Per Provider Order: No     Mobility Bed Mobility Overal bed mobility: Modified Independent                  Transfers Overall transfer level: Needs assistance Equipment used: None Transfers: Sit to/from Stand Sit to Stand:  Supervision                  Balance Overall balance assessment: Mild deficits observed, not formally tested                                         ADL either performed or assessed with clinical judgement   ADL Overall ADL's : Needs assistance/impaired                                       General ADL Comments: SUPERVISION for toilet t/f no AD, improves to MOD I with RW use. IND don/doff underwear sitting.      Pertinent Vitals/Pain Pain Assessment Pain Assessment: Faces Faces Pain Scale: Hurts a little bit Pain Location: abdomen with toileting Pain Descriptors / Indicators: Discomfort, Grimacing Pain Intervention(s): Limited activity within patient's tolerance, Repositioned     Extremity/Trunk Assessment Upper Extremity Assessment Upper Extremity Assessment: Overall WFL for tasks assessed   Lower Extremity Assessment Lower Extremity Assessment: Overall WFL for tasks assessed       Communication Communication Communication: No apparent difficulties   Cognition Arousal: Alert Behavior During Therapy: WFL for tasks assessed/performed Cognition: No apparent impairments                               Following commands: Intact  Home Living Family/patient expects to be discharged to:: Private residence Living Arrangements: Spouse/significant other;Other relatives;Children Available Help at Discharge: Family;Available 24 hours/day Type of Home: House Home Access: Stairs to enter [employer] of Steps: 2 from garage Entrance Stairs-Rails: Right;Left;Can reach both Home Layout: One level     Bathroom Shower/Tub: Tub/shower unit;Walk-in shower   Bathroom Toilet: Standard Bathroom Accessibility: Yes   Home Equipment: BSC/3in1   Additional Comments: spouse has parkinsons      Prior Functioning/Environment Prior Level of Function : Independent/Modified Independent;Driving              Mobility Comments: caregiver for spouse. ADLs Comments: Typically drives short errands but family has been assisting with driving tasks lately.    OT Problem List: Decreased activity tolerance        OT Goals(Current goals can be found in the care plan section)   Acute Rehab OT Goals Patient Stated Goal: to go home OT Goal Formulation: With patient/family Time For Goal Achievement: 02/10/24 Potential to Achieve Goals: Good   AM-PAC OT 6 Clicks Daily Activity     Outcome Measure Help from another person eating meals?: None Help from another person taking care of personal grooming?: None Help from another person toileting, which includes using toliet, bedpan, or urinal?: A Little Help from another person bathing (including washing, rinsing, drying)?: A Little Help from another person to put on and taking off regular upper body clothing?: None Help from another person to put on and taking off regular lower body clothing?: None 6 Click Score: 22   End of Session Equipment Utilized During Treatment: Rolling walker (2 wheels) Nurse Communication: Other (comment) (loose stool and cramping complaints)  Activity Tolerance: Patient tolerated treatment well Patient left: with family/visitor present (seated on toilet with daughters in room)  OT Visit Diagnosis: Other abnormalities of gait and mobility (R26.89);Muscle weakness (generalized) (M62.81)                Time: 9079-9054 OT Time Calculation (min): 25 min Charges:  OT General Charges $OT Visit: 1 Visit OT Evaluation $OT Eval Low Complexity: 1 Low OT Treatments $Self Care/Home Management : 8-22 mins  Elston Slot, M.S. OTR/L  02/10/24, 9:54 AM  ascom [phone]

## 2024-02-10 NOTE — TOC Transition Note (Signed)
 Transition of Care [facility]) - Discharge Note   Patient Details  Name: Sydney Parks MRN: [id number] Date of Birth: [birth year]  [facility] CM/SW Contact:  Lauraine JAYSON Carpen, LCSW Phone Number: 02/10/2024, 10:15 AM   Clinical Narrative:  Patient has orders to discharge home today. CSW met with patient. Daughters at bedside. CSW introduced role and explained that PT recommendations would be discussed. Patient and daughters are agreeable to home health and prefer [facility] because patient's husband is active with them. They accepted for PT. Daughters will buy her a RW. No further concerns. CSW signing off.  Final next level of care: Home w Home Health Services Barriers to Discharge: No Barriers Identified   Patient Goals and CMS Choice     Choice offered to / list presented to : Patient, Adult Children      Discharge Placement                Patient to be transferred to facility by: Daughters Name of family member notified: Devere and Evalene Patient and family notified of of transfer: 02/10/24  Discharge Plan and Services Additional resources added to the After Visit Summary for                            [facility] Arranged: PT [facility] Agency: [employer] [facility] Date [facility] Agency Contacted: 02/10/24   Representative spoke with at [facility] Agency: [place]  Social Drivers of Health (SDOH) Interventions SDOH Screenings   Food Insecurity: No Food Insecurity (02/09/2024)  Housing: Low Risk  (02/09/2024)  Transportation Needs: No Transportation Needs (02/09/2024)  Utilities: Not At Risk (02/09/2024)  Depression (PHQ2-9): High Risk (02/08/2024)  Financial Resource Strain: Low Risk  (09/14/2023)   Received from [facility]  Social Connections: Unknown (02/09/2024)  Tobacco Use: Medium Risk (02/08/2024)     Readmission Risk Interventions     No data to display

## 2024-02-10 NOTE — Consult Note (Signed)
 Hematology/Oncology Consult note [facility] [phone] [phone]  Patient Care Team: Don Lauraine Collar, NP as PCP - General (Internal Medicine) Claudene Larinda Bolder, MD (Inactive) as Referring Physician (Surgery) Georgina Shasta POUR, RN as Oncology Nurse Navigator Melanee Annah BROCKS, MD as Consulting Physician (Oncology) Melanee Annah BROCKS, MD as Consulting Physician (Oncology)   Name of the patient: Sydney Parks  [id number]  Dec 29, 1940    Reason for consult: History of CML on Dasatinib admitted  for weakness and diarrhea   Requesting physician: Dr. Daria Bitter  Date of visit:02/09/24   History of presenting illness-patient is a 83 year old female with Past medical history of breast cancer.  She recently had chest wall recurrence which was resected and she did not wish to take adjuvant endocrine therapy.  She was then seen in our clinic in August 2025 for leukocytosis.  BCR-ABL FISH testing was consistent with 71.5% nuclei positive for BCR-ABL gene fusion signals consistent with CML.  Patient was started on Dasatinib when she took the first dose on 02/03/2024.  1 day after taking the dose patient felt significantly fatigued and has been having nausea vomiting diarrhea and unable to eat much.  She has also been feeling depressed since starting the medication.  She was seen at the cancer center and was found to have AKI on CKD.  Her baseline creatinine runs around 1.8 and her creatinine had doubled to 3.45 from baseline of 1.8.  She was therefore admitted to the hospital.  Presently patient is receiving IV fluids and gradually feeling better.  She still feels fatigued and appetite is not returned back to normal.  ECOG PS- 3  Pain scale- 0   Review of systems- Review of Systems  Constitutional:  Positive for malaise/fatigue. Negative for chills, fever and weight loss.  HENT:  Negative for congestion, ear discharge and nosebleeds.   Eyes:  Negative for  blurred vision.  Respiratory:  Negative for cough, hemoptysis, sputum production, shortness of breath and wheezing.   Cardiovascular:  Negative for chest pain, palpitations, orthopnea and claudication.  Gastrointestinal:  Positive for diarrhea, nausea and vomiting. Negative for abdominal pain, blood in stool, constipation, heartburn and melena.  Genitourinary:  Negative for dysuria, flank pain, frequency, hematuria and urgency.  Musculoskeletal:  Negative for back pain, joint pain and myalgias.  Skin:  Negative for rash.  Neurological:  Negative for dizziness, tingling, focal weakness, seizures, weakness and headaches.  Endo/Heme/Allergies:  Does not bruise/bleed easily.  Psychiatric/Behavioral:  Negative for depression and suicidal ideas. The patient does not have insomnia.     Allergies  Allergen Reactions   Alendronate Diarrhea    Other reaction(s): Dizziness, Other (See Comments) Joint pain   Nadolol     Other reaction(s): Dizziness Per patient, Felt like the back of head was going to explode   Statins     Other reaction(s): Muscle Pain Mevacor-myalgias.  Crestor-Just didn't feel right.    Patient Active Problem List   Diagnosis Date Noted   AKI (acute kidney injury) 02/08/2024   Hyponatremia 02/08/2024   Hyperkalemia 02/08/2024   CML (chronic myelocytic leukemia) (HCC) 01/17/2024   Recurrent breast cancer, right (HCC) 06/01/2022   Chronic venous insufficiency 09/17/2021   Lymphedema 09/17/2021   Aortic insufficiency 10/05/2018   PVC's (premature ventricular contractions) 09/16/2018   History of right breast cancer 07/19/2018   Gout 07/05/2017   Malignant neoplasm of breast (HCC) 03/06/2015   Type 2 diabetes mellitus (HCC) 03/06/2015   Hypercholesterolemia 03/06/2015  S/P mastectomy 08/21/2014   Breast cancer (HCC) 08/21/2014   Arthritis 08/08/2014   Diabetes mellitus, type 2 (HCC) 08/08/2014   Hypercholesteremia 08/08/2014   Hypertension 08/08/2014    Hypothyroidism 08/08/2014   Osteopenia 04/06/2001     Past Medical History:  Diagnosis Date   Arthritis    Breast cancer (HCC) 2000 and 2016   right breast ca twice.  Mastectomy in 2016   Cataract    Diabetes mellitus without complication (HCC)    GERD (gastroesophageal reflux disease)    Hx of migraines    from ages [age]   Hypercholesteremia    Hypertension    Kidney stone on right side    Personal history of radiation therapy 2000   right breast ca   Thyroid disease    Varicose vein of leg      Past Surgical History:  Procedure Laterality Date   BREAST BIOPSY Right 2000   breast ca   BREAST BIOPSY Right 2016   invasive mammary-mastectomy   BREAST BIOPSY Right 05/27/2022   US  RT BREAST BX W LOC DEV 1ST LESION IMG BX SPEC US  GUIDE 05/27/2022 [facility]   BREAST LUMPECTOMY Right 2000   breast ca with rad tx and tamoxifen   CATARACT EXTRACTION W/ INTRAOCULAR LENS  IMPLANT, BILATERAL     MASTECTOMY Right 2016   + complete mastectomy and letrozole  tx   SIMPLE MASTECTOMY WITH AXILLARY SENTINEL NODE BIOPSY Right 08/21/2014   Procedure: SIMPLE MASTECTOMY WITH AXILLARY SENTINEL NODE BIOPSY;  Surgeon: Unknown Sharps, MD;  Location: [facility] ORS;  Service: General;  Laterality: Right;   TONSILLECTOMY     VARICOSE VEIN SURGERY Left    laser surgery    Social History   Socioeconomic History   Marital status: Married    Spouse name: Not on file   Number of children: Not on file   Years of education: Not on file   Highest education level: Not on file  Occupational History   Not on file  Tobacco Use   Smoking status: Former    Current packs/day: 0.00    Average packs/day: 0.5 packs/day for 10.0 years (5.0 ttl pk-yrs)    Types: Cigarettes    Start date: 08/04/1960    Quit date: 08/05/1970    Years since quitting: 53.5   Smokeless tobacco: Never  Vaping Use   Vaping status: Never Used  Substance and Sexual Activity   Alcohol use: No   Drug use: No   Sexual activity: Yes     Birth control/protection: Post-menopausal  Other Topics Concern   Not on file  Social History Narrative   Not on file   Social Drivers of Health   Financial Resource Strain: Low Risk  (09/14/2023)   Received from [facility]   Overall Financial Resource Strain (CARDIA)    Difficulty of Paying Living Expenses: Not hard at all  Food Insecurity: No Food Insecurity (02/09/2024)   Hunger Vital Sign    Worried About Running Out of Food in the Last Year: Never true    Ran Out of Food in the Last Year: Never true  Transportation Needs: No Transportation Needs (02/09/2024)   PRAPARE - Administrator, Civil Service (Medical): No    Lack of Transportation (Non-Medical): No  Physical Activity: Not on file  Stress: Not on file  Social Connections: Unknown (02/09/2024)   Social Connection and Isolation Panel    Frequency of Communication with Friends and Family: More than three times a  week    Frequency of Social Gatherings with Friends and Family: Three times a week    Attends Religious Services: Patient declined    Active Member of Clubs or Organizations: Patient declined    Attends Banker Meetings: Patient declined    Marital Status: Married  Catering Manager Violence: Not At Risk (02/09/2024)   Humiliation, Afraid, Rape, and Kick questionnaire    Fear of Current or Ex-Partner: No    Emotionally Abused: No    Physically Abused: No    Sexually Abused: No     Family History  Problem Relation Age of Onset   Osteoarthritis Mother    Heart disease Father    Dementia Sister    Breast cancer Daughter 63     Current Facility-Administered Medications:    0.9 %  sodium chloride  infusion, , Intravenous, Continuous, Al-Sultani, Anmar, MD, Last Rate: 75 mL/hr at 02/09/24 1541, New Bag at 02/09/24 1541   acetaminophen  (TYLENOL ) tablet 650 mg, 650 mg, Oral, Q6H PRN, 650 mg at 02/09/24 2235 **OR** acetaminophen  (TYLENOL ) suppository 650 mg, 650 mg,  Rectal, Q6H PRN, Fernand Prost, MD   heparin injection 5,000 Units, 5,000 Units, Subcutaneous, Q8H, Fernand Prost, MD, 5,000 Units at 02/10/24 9371   hydrALAZINE (APRESOLINE) injection 10 mg, 10 mg, Intravenous, Q6H PRN, Al-Sultani, Anmar, MD   levothyroxine  (SYNTHROID ) tablet 100 mcg, 100 mcg, Oral, Q0600, Fernand Prost, MD, 100 mcg at 02/10/24 9371   loperamide (IMODIUM) capsule 2 mg, 2 mg, Oral, PRN, Al-Sultani, Anmar, MD, 2 mg at 02/10/24 0955   melatonin tablet 5 mg, 5 mg, Oral, QHS, Fernand Prost, MD, 5 mg at 02/08/24 2232   Oral care mouth rinse, 15 mL, Mouth Rinse, PRN, Mansy, Jan A, MD   sodium chloride  flush (NS) 0.9 % injection 3 mL, 3 mL, Intravenous, Q12H, Fernand Prost, MD, 3 mL at 02/10/24 0955   Physical exam:  Vitals:   02/09/24 2259 02/10/24 0446 02/10/24 0500 02/10/24 0823  BP: (!) 133/38 (!) 113/36  (!) 134/41  Pulse: 69 (!) 54  61  Resp: 20 18  16   Temp: 99.8 F (37.7 C) 97.7 F (36.5 C)  98 F (36.7 C)  TempSrc:      SpO2: 97% 96%  97%  Weight:   153 lb (69.4 kg)   Height:       Physical Exam Constitutional:      Comments: Frail and fatigued  Cardiovascular:     Rate and Rhythm: Normal rate and regular rhythm.     Heart sounds: Normal heart sounds.  Pulmonary:     Effort: Pulmonary effort is normal.     Breath sounds: Normal breath sounds.  Abdominal:     General: Bowel sounds are normal.     Palpations: Abdomen is soft.  Skin:    General: Skin is warm and dry.  Neurological:     Mental Status: She is alert and oriented to person, place, and time.           Latest Ref Rng & Units 02/10/2024    4:05 AM  CMP  Glucose 70 - 99 mg/dL 84   BUN 8 - 23 mg/dL 43   Creatinine 9.55 - 1.00 mg/dL 7.94   Sodium 864 - 854 mmol/L 138   Potassium 3.5 - 5.1 mmol/L 4.3   Chloride 98 - 111 mmol/L 110   CO2 22 - 32 mmol/L 20   Calcium  8.9 - 10.3 mg/dL 8.4       Latest Ref  Rng & Units 02/10/2024    4:05 AM  CBC  WBC 4.0 - 10.5 K/uL 16.1   Hemoglobin 12.0 -  15.0 g/dL 8.8   Hematocrit 63.9 - 46.0 % 26.6   Platelets 150 - 400 K/uL 219     @IMAGES @  US  RENAL Result Date: 02/08/2024 CLINICAL DATA:  Acute kidney injury. EXAM: RENAL / URINARY TRACT ULTRASOUND COMPLETE COMPARISON:  None Available. FINDINGS: Right Kidney: Renal measurements: 8.6 cm x 4.0 cm x 5.1 cm = volume: 91 mL. There is increased echogenicity of the renal parenchyma. A right-sided extrarenal pelvis is noted. No mass or hydronephrosis visualized. Left Kidney: Renal measurements: 9.9 cm x 5.0 cm x 5.1 cm = volume: 131 mL. There is increased echogenicity of the renal parenchyma. No mass or hydronephrosis visualized. Bladder: Appears normal for degree of bladder distention. The bilateral ureteral jets are visualized. Other: None. IMPRESSION: Bilateral echogenic kidneys which likely represents sequelae associated with medical renal disease. Electronically Signed   By: Suzen Dials M.D.   On: 02/08/2024 17:52   CT HEAD WO CONTRAST ( ) Result Date: 02/08/2024 CLINICAL DATA:  Minor head trauma. EXAM: CT HEAD WITHOUT CONTRAST TECHNIQUE: Contiguous axial images were obtained from the base of the skull through the vertex without intravenous contrast. RADIATION DOSE REDUCTION: This exam was performed according to the departmental dose-optimization program which includes automated exposure control, adjustment of the mA and/or kV according to patient size and/or use of iterative reconstruction technique. COMPARISON:  MRI brain 12/22/2023 FINDINGS: Brain: Ventricles, cisterns and other CSF spaces are within normal. Evidence of minimal chronic ischemic microvascular disease. 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 No mass, mass effect, shift of midline structures or acute hemorrhage. Vascular: No hyperdense vessel or unexpected calcification. Skull: Normal. Negative for fracture or focal lesion. Sinuses/Orbits: Visualized orbits are normal. Paranasal sinuses are well aerated. There are hypoplastic  frontal sinuses. Other: None. IMPRESSION: 1. No acute findings. 2. 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 Electronically Signed   By: Toribio Agreste M.D.   On: 02/08/2024 15:18   US  SPLEEN (ABDOMEN LIMITED) Result Date: 01/29/2024 EXAM: Right Upper Quadrant Abdominal Ultrasound 01/26/2024 01:59:48 PM TECHNIQUE: Real-time ultrasonography of the right upper quadrant of the abdomen was performed. COMPARISON: None available. CLINICAL HISTORY: spleomegaly, breast cancer. FINDINGS: SPLEEN: The spleen measures 12.7 x 12.6 x 4.9 cm, volume 410 ml. IMPRESSION: 1. Spleen measures 12.7 x 12.6 x 4.9 cm, volume 410 ml, consistent with splenomegaly. Electronically signed by: Greig Pique MD 01/29/2024 12:41 AM EDT RP Workstation: [id number]   DG Chest 2 View Result Date: 01/18/2024 CLINICAL DATA:  Breast cancer EXAM: CHEST - 2 VIEW COMPARISON:  None Available. FINDINGS: The heart size and mediastinal contours are within normal limits. Both lungs are clear. The visualized skeletal structures are unremarkable. IMPRESSION: No acute abnormality of the lungs. Electronically Signed   By: Marolyn JONETTA Jaksch M.D.   On: 01/18/2024 09:48    Assessment and plan- Patient is a 83 y.o. female with prior history of breast cancer now admitted for newly diagnosed CML and started on Dasatinib 1 week ago admitted for nausea vomiting diarrhea  At this time we will continue to hold Dasatinib and allow the patient to feel better with IV hydration.  I will follow-up with her as an outpatient next week with repeat BMP and give her more IV fluids if need be.  However in the long run patient needs some treatment for her CML as untreated CML overallI portends  poor prognosis with risk of progression to acute leukemia.  Untreated CML carries a mortality of 10 to 20 %/year and average historical lifespan of about 3 to 5 years.  Less than 50% of the patients live for greater than 5 years.   I will  consider starting the patient on imatinib 300 mg daily instead of reduced dose Dasatinib after a couple of weeks following Thanksgiving and see if she can tolerate that.  This would be a reduced dose as typical dose of imatinib in this chronic phase CML is 400 mg daily.  If patient is unable to tolerate that as well we may have to follow a more supportive approach   Thank you for this kind referral and the opportunity to participate in the care of this  Patient   Visit Diagnosis 1. AKI (acute kidney injury)   2. CML (chronic myelocytic leukemia) (HCC)   3. Weakness     Dr. Annah Skene, MD, MPH [facility] at [facility] [phone] 02/10/2024

## 2024-02-10 NOTE — Plan of Care (Signed)

## 2024-02-11 ENCOUNTER — Encounter: Payer: Self-pay | Admitting: Oncology

## 2024-02-11 ENCOUNTER — Other Ambulatory Visit: Payer: Self-pay

## 2024-02-11 DIAGNOSIS — N179 Acute kidney failure, unspecified: Secondary | ICD-10-CM

## 2024-02-14 ENCOUNTER — Inpatient Hospital Stay

## 2024-02-14 ENCOUNTER — Inpatient Hospital Stay: Admitting: Nurse Practitioner

## 2024-02-14 ENCOUNTER — Encounter: Payer: Self-pay | Admitting: Nurse Practitioner

## 2024-02-14 ENCOUNTER — Other Ambulatory Visit: Payer: Self-pay

## 2024-02-14 VITALS — BP 116/79 | HR 70 | Temp 97.9°F | Resp 18 | Wt 153.4 lb

## 2024-02-14 DIAGNOSIS — C9210 Chronic myeloid leukemia, BCR/ABL-positive, not having achieved remission: Secondary | ICD-10-CM

## 2024-02-14 DIAGNOSIS — F32A Depression, unspecified: Secondary | ICD-10-CM

## 2024-02-14 DIAGNOSIS — N179 Acute kidney failure, unspecified: Secondary | ICD-10-CM

## 2024-02-14 LAB — CBC WITH DIFFERENTIAL (CANCER CENTER ONLY)
Abs Immature Granulocytes: 0.58 K/uL — ABNORMAL HIGH (ref 0.00–0.07)
Basophils Absolute: 0.1 K/uL (ref 0.0–0.1)
Basophils Relative: 1 %
Eosinophils Absolute: 0.3 K/uL (ref 0.0–0.5)
Eosinophils Relative: 3 %
HCT: 28.6 % — ABNORMAL LOW (ref 36.0–46.0)
Hemoglobin: 9.5 g/dL — ABNORMAL LOW (ref 12.0–15.0)
Immature Granulocytes: 6 %
Lymphocytes Relative: 16 %
Lymphs Abs: 1.4 K/uL (ref 0.7–4.0)
MCH: 28.6 pg (ref 26.0–34.0)
MCHC: 33.2 g/dL (ref 30.0–36.0)
MCV: 86.1 fL (ref 80.0–100.0)
Monocytes Absolute: 0.3 K/uL (ref 0.1–1.0)
Monocytes Relative: 4 %
Neutro Abs: 6.5 K/uL (ref 1.7–7.7)
Neutrophils Relative %: 70 %
Platelet Count: 235 K/uL (ref 150–400)
RBC: 3.32 MIL/uL — ABNORMAL LOW (ref 3.87–5.11)
RDW: 17.8 % — ABNORMAL HIGH (ref 11.5–15.5)
Smear Review: NORMAL
WBC Count: 9.3 K/uL (ref 4.0–10.5)
nRBC: 0.2 % (ref 0.0–0.2)

## 2024-02-14 LAB — BASIC METABOLIC PANEL - CANCER CENTER ONLY
Anion gap: 10 (ref 5–15)
BUN: 28 mg/dL — ABNORMAL HIGH (ref 8–23)
CO2: 20 mmol/L — ABNORMAL LOW (ref 22–32)
Calcium: 9.1 mg/dL (ref 8.9–10.3)
Chloride: 103 mmol/L (ref 98–111)
Creatinine: 1.55 mg/dL — ABNORMAL HIGH (ref 0.44–1.00)
GFR, Estimated: 33 mL/min — ABNORMAL LOW (ref >60)
Glucose, Bld: 169 mg/dL — ABNORMAL HIGH (ref 70–99)
Potassium: 4.3 mmol/L (ref 3.5–5.1)
Sodium: 133 mmol/L — ABNORMAL LOW (ref 135–145)

## 2024-02-14 NOTE — Progress Notes (Signed)
 Hematology/Oncology Consult note [facility]  Telephone:([phone] [phone]  Patient Care Team: Don Lauraine Collar, NP as PCP - General (Internal Medicine) Claudene Larinda Bolder, MD (Inactive) as Referring Physician (Surgery) Georgina Shasta POUR, RN as Oncology Nurse Navigator Melanee Annah BROCKS, MD as Consulting Physician (Oncology) Melanee Annah BROCKS, MD as Consulting Physician (Oncology)   Name of the patient: Sydney Parks  [id number]  05/26/1940   Date of visit: 02/14/24  Diagnosis- 1.  History of recurrent breast cancer presently in remission 2.  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  She had lumpectomy, postlumpectomy radiation and 5 years of tamoxifen back then.  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  Tumor was T1b N0 M0.  She underwent mastectomy and was started on letrozole  by Dr. Wilder.  She did not require postmastectomy radiation or chemotherapy.  It appears that patient did not complete 5 years of letrozole  and was lost to follow-up.  Patient self palpated a mass along her mastectomy scar which led toLeft breast mammogram as well as right chest wall ultrasound.  Mammogram showed 1.3 cm medial right breast/chest mass which contacts and appears to invade underlying pectoralis muscle.  No abnormal appearing right axillary lymph nodes.  No mammographic appearance of left breast malignancy.   Systemic scans were negative for metastatic disease.  Patient was seen by Dr. Iva at [facility] and underwent definitive excision of the postmastectomy recurrence.  Final pathSurgery showed 1.6 cm grade 2 invasive mammary carcinoma with negative margins.  ER 100% positive, PR 80% positive and HER2 negative.  Patient was also seen by radiation  oncology at [facility] and declined adjuvant radiation. She started letrozole  in April 2024 but stopped subsequently due to intolerance  Patient was seen by NP Tinnie Dawn in August 2025 for evaluation of leukocytosis.Flow cytometry showed leukocytosis with left shifted aberrant granulocytes and monocytes as well as absolute basophilia and eosinophilia concerning for myeloproliferative process.  No increase in peripheral blasts.  BCR-ABL FISH testing was consistent with 71.5% nuclei positive for BCR-ABL gene fusion signals consistent with CML.  Interval history- Patient presented to the clinic with her daughter today for hospital follow-up.  She reports feeling much better able to walk in facility and not use her wheelchair.  She was started on Sprycel on 1029 which caused diarrhea.  In combination with fluid loss from diarrhea and being on the multiple diuretics patient was admitted to the hospital with acute kidney injury and electrolyte imbalance that has since then been reversed with IV fluid boluses.  She does have a history of diabetes, hospital MD did hold her metformin , Lasix, spironolactone, lisinopril  d/t AKI.  Today her blood pressure is within normal limits.  She does not have any increased peripheral edema.  And her blood sugar was within normal limits as well all blood work improved and stable.  I have advised her to keep a record of her blood sugars twice a day to show her PCP on her follow-up visit.  She is requesting a new PCP provider.  And they do plan to reach out and find her new provider within the [facility] network to follow-up with.  Previous visit she was very emotional, crying, depressed.  She did get tearful couple of times during  her visit but reports these symptoms are improving as well.  Her daughter reports that she has been a lot better emotionally this past week but no completely better.  Not currently on anything for depression or anxiety.  Decision was made to give it more time as  symptoms are improving.  I did offer for them to seek with her social worker they do not wish to do that at this time.  Patient seems to think that she is getting a whole lot better emotionally and will continue to improve as she feels better.  She is currently getting physical therapy and Occupational Therapy in the home.  She reports continuing to feel some weakness but getting much better each week.  No SI or HI.  Her renal function is better than her baseline that is documented.  She does not appear dry today denies any dizziness.  She does not appear to need any IV fluids this visit.  Electrolytes stable and greatly improved as well.  The plan moving forward is her to follow-up with Dr. Melanee in about 3 weeks to discuss treatment options and review repeat labs.  Instructed patient to continue to stay hydrated.  And to call facility with any new symptoms or concerns.   ECOG PS- 2 Pain scale- 0   Review of systems- Review of Systems  Constitutional:  Positive for malaise/fatigue. Negative for chills, fever and weight loss.  HENT:  Negative for congestion, ear discharge and nosebleeds.   Eyes:  Negative for blurred vision.  Respiratory:  Negative for cough, hemoptysis, sputum production, shortness of breath and wheezing.   Cardiovascular:  Negative for chest pain, palpitations, orthopnea and claudication.  Gastrointestinal:  Negative for abdominal pain, blood in stool, constipation, diarrhea, heartburn, melena, nausea and vomiting.  Genitourinary:  Negative for dysuria, flank pain, frequency, hematuria and urgency.  Musculoskeletal:  Negative for back pain, joint pain and myalgias.  Skin:  Negative for rash.  Neurological:  Negative for dizziness, tingling, focal weakness, seizures, weakness and headaches.  Endo/Heme/Allergies:  Does not bruise/bleed easily.  Psychiatric/Behavioral:  Negative for depression and suicidal ideas. The patient does not have insomnia.       Allergies  Allergen  Reactions   Alendronate Diarrhea    Other reaction(s): Dizziness, Other (See Comments) Joint pain   Nadolol     Other reaction(s): Dizziness Per patient, Felt like the back of head was going to explode   Statins     Other reaction(s): Muscle Pain Mevacor-myalgias.  Crestor-Just didn't feel right.     Past Medical History:  Diagnosis Date   Arthritis    Breast cancer (HCC) 2000 and 2016   right breast ca twice.  Mastectomy in 2016   Cataract    Diabetes mellitus without complication (HCC)    GERD (gastroesophageal reflux disease)    Hx of migraines    from ages [age]   Hypercholesteremia    Hypertension    Kidney stone on right side    Personal history of radiation therapy 2000   right breast ca   Thyroid disease    Varicose vein of leg      Past Surgical History:  Procedure Laterality Date   BREAST BIOPSY Right 2000   breast ca   BREAST BIOPSY Right 2016   invasive mammary-mastectomy   BREAST BIOPSY Right 05/27/2022   US  RT BREAST BX W LOC DEV 1ST LESION IMG BX SPEC US  GUIDE 05/27/2022 [facility]   BREAST LUMPECTOMY Right 2000   breast  ca with rad tx and tamoxifen   CATARACT EXTRACTION W/ INTRAOCULAR LENS  IMPLANT, BILATERAL     MASTECTOMY Right 2016   + complete mastectomy and letrozole  tx   SIMPLE MASTECTOMY WITH AXILLARY SENTINEL NODE BIOPSY Right 08/21/2014   Procedure: SIMPLE MASTECTOMY WITH AXILLARY SENTINEL NODE BIOPSY;  Surgeon: Unknown Sharps, MD;  Location: [facility] ORS;  Service: General;  Laterality: Right;   TONSILLECTOMY     VARICOSE VEIN SURGERY Left    laser surgery    Social History   Socioeconomic History   Marital status: Married    Spouse name: Not on file   Number of children: Not on file   Years of education: Not on file   Highest education level: Not on file  Occupational History   Not on file  Tobacco Use   Smoking status: Former    Current packs/day: 0.00    Average packs/day: 0.5 packs/day for 10.0 years (5.0 ttl pk-yrs)     Types: Cigarettes    Start date: 08/04/1960    Quit date: 08/05/1970    Years since quitting: 53.5   Smokeless tobacco: Never  Vaping Use   Vaping status: Never Used  Substance and Sexual Activity   Alcohol use: No   Drug use: No   Sexual activity: Yes    Birth control/protection: Post-menopausal  Other Topics Concern   Not on file  Social History Narrative   Not on file   Social Drivers of Health   Financial Resource Strain: Low Risk  (09/14/2023)   Received from [facility]   Overall Financial Resource Strain (CARDIA)    Difficulty of Paying Living Expenses: Not hard at all  Food Insecurity: No Food Insecurity (02/09/2024)   Hunger Vital Sign    Worried About Running Out of Food in the Last Year: Never true    Ran Out of Food in the Last Year: Never true  Transportation Needs: No Transportation Needs (02/09/2024)   PRAPARE - Administrator, Civil Service (Medical): No    Lack of Transportation (Non-Medical): No  Physical Activity: Not on file  Stress: Not on file  Social Connections: Unknown (02/09/2024)   Social Connection and Isolation Panel    Frequency of Communication with Friends and Family: More than three times a week    Frequency of Social Gatherings with Friends and Family: Three times a week    Attends Religious Services: Patient declined    Active Member of Clubs or Organizations: Patient declined    Attends Banker Meetings: Patient declined    Marital Status: Married  Catering Manager Violence: Not At Risk (02/09/2024)   Humiliation, Afraid, Rape, and Kick questionnaire    Fear of Current or Ex-Partner: No    Emotionally Abused: No    Physically Abused: No    Sexually Abused: No    Family History  Problem Relation Age of Onset   Osteoarthritis Mother    Heart disease Father    Dementia Sister    Breast cancer Daughter 81     Current Outpatient Medications:    acetaminophen  (TYLENOL ) 500 MG tablet, Take 500 mg  by mouth every 6 (six) hours as needed for mild pain., Disp: , Rfl:    allopurinol (ZYLOPRIM) 300 MG tablet, Take 300 mg by mouth daily., Disp: , Rfl:    aspirin EC 81 MG tablet, Take 81 mg by mouth., Disp: , Rfl:    gabapentin (NEURONTIN) 100 MG capsule, Take 200 mg by  mouth 2 (two) times daily., Disp: , Rfl:    levothyroxine  (SYNTHROID , LEVOTHROID) 100 MCG tablet, Take 100 mcg by mouth daily before breakfast., Disp: , Rfl:    meclizine (ANTIVERT) 25 MG tablet, Take 25 mg by mouth 3 (three) times daily., Disp: , Rfl:    Melatonin 10 MG TABS, Take 1 tablet by mouth at bedtime., Disp: , Rfl:    colchicine 0.6 MG tablet, Take 0.6 mg by mouth as needed. (Patient not taking: Reported on 02/14/2024), Disp: , Rfl:    [Paused] dasatinib (SPRYCEL) 100 MG tablet, Take 1 tablet (100 mg total) by mouth daily. (Patient not taking: Reported on 02/14/2024), Disp: 30 tablet, Rfl: 2   [Paused] furosemide (LASIX) 20 MG tablet, Take 20 mg by mouth daily. (Patient not taking: Reported on 02/14/2024), Disp: , Rfl:    [Paused] hydrochlorothiazide  (HYDRODIURIL ) 12.5 MG tablet, Take 12.5 mg by mouth daily. (Patient not taking: Reported on 02/14/2024), Disp: , Rfl:    [Paused] lisinopril  (ZESTRIL ) 40 MG tablet, Take 40 mg by mouth daily. (Patient not taking: Reported on 02/14/2024), Disp: , Rfl:    [Paused] metFORMIN  (GLUCOPHAGE ) 500 MG tablet, Take 500 mg by mouth every morning.  (Patient not taking: Reported on 02/14/2024), Disp: , Rfl:    [Paused] spironolactone (ALDACTONE) 25 MG tablet, Take 25 mg by mouth daily. (Patient not taking: Reported on 02/14/2024), Disp: , Rfl:   Physical exam:  Vitals:   02/14/24 1306  BP: 116/79  Pulse: 70  Resp: 18  Temp: 97.9 F (36.6 C)  TempSrc: Tympanic  Weight: 153 lb 6.4 oz (69.6 kg)   Physical Exam Constitutional:      Appearance: She is ill-appearing.  HENT:     Mouth/Throat:     Comments: Dry mouth Cardiovascular:     Rate and Rhythm: Normal rate and regular  rhythm.     Heart sounds: Normal heart sounds.  Pulmonary:     Effort: Pulmonary effort is normal.     Breath sounds: Normal breath sounds.  Abdominal:     General: Bowel sounds are normal.     Palpations: Abdomen is soft.     Comments: No palpable splenomegaly  Skin:    General: Skin is warm and dry.  Neurological:     Mental Status: She is alert and oriented to person, place, and time.  Psychiatric:     Comments: Tearful during visit      I have personally reviewed labs listed below:    Latest Ref Rng & Units 02/14/2024   12:40 PM  CMP  Glucose 70 - 99 mg/dL 830   BUN 8 - 23 mg/dL 28   Creatinine 9.55 - 1.00 mg/dL 8.44   Sodium 864 - 854 mmol/L 133   Potassium 3.5 - 5.1 mmol/L 4.3   Chloride 98 - 111 mmol/L 103   CO2 22 - 32 mmol/L 20   Calcium  8.9 - 10.3 mg/dL 9.1       Latest Ref Rng & Units 02/14/2024   12:41 PM  CBC  WBC 4.0 - 10.5 K/uL 9.3   Hemoglobin 12.0 - 15.0 g/dL 9.5   Hematocrit 63.9 - 46.0 % 28.6   Platelets 150 - 400 K/uL 235    I have personally reviewed Radiology images listed below: No images are attached to the encounter.  US  RENAL Result Date: 02/08/2024 CLINICAL DATA:  Acute kidney injury. EXAM: RENAL / URINARY TRACT ULTRASOUND COMPLETE COMPARISON:  None Available. FINDINGS: Right Kidney: Renal measurements: 8.6 cm x 4.0  cm x 5.1 cm = volume: 91 mL. There is increased echogenicity of the renal parenchyma. A right-sided extrarenal pelvis is noted. No mass or hydronephrosis visualized. Left Kidney: Renal measurements: 9.9 cm x 5.0 cm x 5.1 cm = volume: 131 mL. There is increased echogenicity of the renal parenchyma. No mass or hydronephrosis visualized. Bladder: Appears normal for degree of bladder distention. The bilateral ureteral jets are visualized. Other: None. IMPRESSION: Bilateral echogenic kidneys which likely represents sequelae associated with medical renal disease. Electronically Signed   By: Suzen Dials M.D.   On: 02/08/2024 17:52    CT HEAD WO CONTRAST ( ) Result Date: 02/08/2024 CLINICAL DATA:  Minor head trauma. EXAM: CT HEAD WITHOUT CONTRAST TECHNIQUE: Contiguous axial images were obtained from the base of the skull through the vertex without intravenous contrast. RADIATION DOSE REDUCTION: This exam was performed according to the departmental dose-optimization program which includes automated exposure control, adjustment of the mA and/or kV according to patient size and/or use of iterative reconstruction technique. COMPARISON:  MRI brain 12/22/2023 FINDINGS: Brain: Ventricles, cisterns and other CSF spaces are within normal. Evidence of minimal chronic ischemic microvascular disease. 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 No mass, mass effect, shift of midline structures or acute hemorrhage. Vascular: No hyperdense vessel or unexpected calcification. Skull: Normal. Negative for fracture or focal lesion. Sinuses/Orbits: Visualized orbits are normal. Paranasal sinuses are well aerated. There are hypoplastic frontal sinuses. Other: None. IMPRESSION: 1. No acute findings. 2. 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 Electronically Signed   By: Toribio Agreste M.D.   On: 02/08/2024 15:18   US  SPLEEN (ABDOMEN LIMITED) Result Date: 01/29/2024 EXAM: Right Upper Quadrant Abdominal Ultrasound 01/26/2024 01:59:48 PM TECHNIQUE: Real-time ultrasonography of the right upper quadrant of the abdomen was performed. COMPARISON: None available. CLINICAL HISTORY: spleomegaly, breast cancer. FINDINGS: SPLEEN: The spleen measures 12.7 x 12.6 x 4.9 cm, volume 410 ml. IMPRESSION: 1. Spleen measures 12.7 x 12.6 x 4.9 cm, volume 410 ml, consistent with splenomegaly. Electronically signed by: Greig Pique MD 01/29/2024 12:41 AM EDT RP Workstation: [id number]   DG Chest 2 View Result Date: 01/18/2024 CLINICAL DATA:  Breast cancer EXAM: CHEST - 2 VIEW COMPARISON:  None Available. FINDINGS: The heart size and  mediastinal contours are within normal limits. Both lungs are clear. The visualized skeletal structures are unremarkable. IMPRESSION: No acute abnormality of the lungs. Electronically Signed   By: Marolyn JONETTA Jaksch M.D.   On: 01/18/2024 09:48     Assessment and plan-  Diarrhea: resolved  AKI secondary to diarrhea:  Resolved Creatinine today 1.55 baseline 1.80-1.90 encouraged continue hydration will closely monitor  Hyponatremia: Improved Na 133 f/u in 3 weeks repeat labs MD visit  Hyperkalemia: resolved K 4.3 f/u in 3 weeks repeat labs and MD visit   Plan:  F/U 12/1 with cbc with diff/ cmp discuss treatment options with Dr. Melanee LP         Morna Husband AGNP-C [facility] [phone] 02/14/2024 3:16 PM

## 2024-02-14 NOTE — Progress Notes (Signed)
 No iv fluids today

## 2024-02-21 ENCOUNTER — Ambulatory Visit (INDEPENDENT_AMBULATORY_CARE_PROVIDER_SITE_OTHER): Admitting: Family Medicine

## 2024-02-21 ENCOUNTER — Encounter: Payer: Self-pay | Admitting: Family Medicine

## 2024-02-21 ENCOUNTER — Telehealth: Payer: Self-pay

## 2024-02-21 VITALS — BP 142/62 | HR 57 | Ht 66.0 in | Wt 156.0 lb

## 2024-02-21 DIAGNOSIS — M1A39X0 Chronic gout due to renal impairment, multiple sites, without tophus (tophi): Secondary | ICD-10-CM

## 2024-02-21 DIAGNOSIS — E039 Hypothyroidism, unspecified: Secondary | ICD-10-CM

## 2024-02-21 DIAGNOSIS — E119 Type 2 diabetes mellitus without complications: Secondary | ICD-10-CM

## 2024-02-21 DIAGNOSIS — I10 Essential (primary) hypertension: Secondary | ICD-10-CM | POA: Diagnosis not present

## 2024-02-21 DIAGNOSIS — C9210 Chronic myeloid leukemia, BCR/ABL-positive, not having achieved remission: Secondary | ICD-10-CM | POA: Diagnosis not present

## 2024-02-21 DIAGNOSIS — N184 Chronic kidney disease, stage 4 (severe): Secondary | ICD-10-CM | POA: Diagnosis not present

## 2024-02-21 DIAGNOSIS — Z7689 Persons encountering health services in other specified circumstances: Secondary | ICD-10-CM

## 2024-02-21 DIAGNOSIS — E7800 Pure hypercholesterolemia, unspecified: Secondary | ICD-10-CM

## 2024-02-21 NOTE — Progress Notes (Unsigned)
 New Patient Office Visit  Subjective   Patient ID: Sydney Parks, female    DOB: July 22, 1940  Age: 83 y.o. MRN: [id number]  CC:  Chief Complaint  Patient presents with   Establish Care   HPI Sydney Parks is an 83 year old female who presents to establish with [facility].   CC: Patient here to establish care  Last PCP: Don Lauraine Collar, NP Specialists: PT twice a week  PMHx:   Serum creatinine 1.55 eGFR 33  Blood pressures: 147/60s   Sprycel- 2nd gen pill (more potent)- diarrhea- blood checked weekly.  Has CKD-- lasix, hydrochlorothiazide , lisinopril , spironolactone  Severely dehydrated  11/4 admitted thru ED until 11/6  Sydney Parks is a 83 y.o. 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   Recent hospitalization for AKI, HTN, hypothyroidism, CML, gout, hyponatremia, and hypokalemia Pcp f/u 1-2 weeks, attention to blood pressure, ongoing need for diuretic, and glucose control at that time   Outpatient Encounter Medications as of 02/21/2024  Medication Sig   acetaminophen  (TYLENOL ) 500 MG tablet Take 500 mg by mouth every 6 (six) hours as needed for mild pain.   allopurinol (ZYLOPRIM) 300 MG tablet Take 300 mg by mouth daily.   aspirin EC 81 MG tablet Take 81 mg by mouth.   gabapentin (NEURONTIN) 100 MG capsule Take 200 mg by mouth 2 (two) times daily.   levothyroxine  (SYNTHROID , LEVOTHROID) 100 MCG tablet Take 100 mcg by mouth daily before breakfast.   meclizine (ANTIVERT) 25 MG tablet Take 25 mg by mouth 3 (three) times daily.   Melatonin 10 MG TABS Take 1 tablet by mouth at bedtime.   colchicine 0.6 MG tablet Take 0.6 mg by mouth as needed. (Patient not taking: Reported on 02/14/2024)   [Paused] dasatinib (SPRYCEL)  100 MG tablet Take 1 tablet (100 mg total) by mouth daily. (Patient not taking: Reported on 02/14/2024)   [Paused] furosemide (LASIX) 20 MG tablet Take 20 mg by mouth daily. (Patient not taking: Reported on 02/14/2024)   [Paused] hydrochlorothiazide  (HYDRODIURIL ) 12.5 MG tablet Take 12.5 mg by mouth daily. (Patient not taking: Reported on 02/14/2024)   [Paused] lisinopril  (ZESTRIL ) 40 MG tablet Take 40 mg by mouth daily. (Patient not taking: Reported on 02/14/2024)   [Paused] metFORMIN  (GLUCOPHAGE ) 500 MG tablet Take 500 mg by mouth every morning.  (Patient not taking: Reported on 02/14/2024)   [Paused] spironolactone (ALDACTONE) 25 MG tablet Take 25 mg by mouth daily. (Patient not taking: Reported on 02/14/2024)   No facility-administered encounter medications on file as of 02/21/2024.    Patient Active Problem List   Diagnosis Date Noted   AKI (acute kidney injury) 02/08/2024   Hyponatremia 02/08/2024   Hyperkalemia 02/08/2024   CML (chronic myelocytic leukemia) (HCC) 01/17/2024   Recurrent breast cancer, right (HCC) 06/01/2022   Chronic venous insufficiency 09/17/2021   Lymphedema 09/17/2021   Aortic insufficiency 10/05/2018   PVC's (premature ventricular contractions) 09/16/2018   History of right breast cancer 07/19/2018   Gout 07/05/2017   Malignant neoplasm of breast (HCC) 03/06/2015   Type 2 diabetes mellitus (HCC) 03/06/2015   Hypercholesterolemia 03/06/2015   S/P mastectomy 08/21/2014   Breast cancer (HCC) 08/21/2014   Arthritis 08/08/2014   Diabetes mellitus, type 2 (HCC) 08/08/2014  Hypercholesteremia 08/08/2014   Hypertension 08/08/2014   Hypothyroidism 08/08/2014   Osteopenia 04/06/2001   Past Medical History:  Diagnosis Date   Arthritis    Breast cancer (HCC) 2000 and 2016   right breast ca twice.  Mastectomy in 2016   Cataract    Diabetes mellitus without complication (HCC)    GERD (gastroesophageal reflux disease)    Hx of migraines    from ages [age]    Hypercholesteremia    Hypertension    Kidney stone on right side    Personal history of radiation therapy 2000   right breast ca   Thyroid disease    Varicose vein of leg    Past Surgical History:  Procedure Laterality Date   BREAST BIOPSY Right 2000   breast ca   BREAST BIOPSY Right 2016   invasive mammary-mastectomy   BREAST BIOPSY Right 05/27/2022   US  RT BREAST BX W LOC DEV 1ST LESION IMG BX SPEC US  GUIDE 05/27/2022 [facility]   BREAST LUMPECTOMY Right 2000   breast ca with rad tx and tamoxifen   CATARACT EXTRACTION W/ INTRAOCULAR LENS  IMPLANT, BILATERAL     MASTECTOMY Right 2016   + complete mastectomy and letrozole  tx   SIMPLE MASTECTOMY WITH AXILLARY SENTINEL NODE BIOPSY Right 08/21/2014   Procedure: SIMPLE MASTECTOMY WITH AXILLARY SENTINEL NODE BIOPSY;  Surgeon: Unknown Sharps, MD;  Location: [facility] ORS;  Service: General;  Laterality: Right;   TONSILLECTOMY     VARICOSE VEIN SURGERY Left    laser surgery   Family History  Problem Relation Age of Onset   Osteoarthritis Mother    Heart disease Father    Dementia Sister    Breast cancer Daughter 73   Social History   Socioeconomic History   Marital status: Married    Spouse name: Not on file   Number of children: Not on file   Years of education: Not on file   Highest education level: Not on file  Occupational History   Not on file  Tobacco Use   Smoking status: Former    Current packs/day: 0.00    Average packs/day: 0.5 packs/day for 10.0 years (5.0 ttl pk-yrs)    Types: Cigarettes    Start date: 08/04/1960    Quit date: 08/05/1970    Years since quitting: 53.5   Smokeless tobacco: Never  Vaping Use   Vaping status: Never Used  Substance and Sexual Activity   Alcohol use: No   Drug use: No   Sexual activity: Yes    Birth control/protection: Post-menopausal  Other Topics Concern   Not on file  Social History Narrative   Not on file   Social Drivers of Health   Financial Resource Strain: Low Risk   (09/14/2023)   Received from [facility]   Overall Financial Resource Strain (CARDIA)    Difficulty of Paying Living Expenses: Not hard at all  Food Insecurity: No Food Insecurity (02/09/2024)   Hunger Vital Sign    Worried About Running Out of Food in the Last Year: Never true    Ran Out of Food in the Last Year: Never true  Transportation Needs: No Transportation Needs (02/09/2024)   PRAPARE - Administrator, Civil Service (Medical): No    Lack of Transportation (Non-Medical): No  Physical Activity: Not on file  Stress: Not on file  Social Connections: Unknown (02/09/2024)   Social Connection and Isolation Panel    Frequency of Communication with Friends and Family: More  than three times a week    Frequency of Social Gatherings with Friends and Family: Three times a week    Attends Religious Services: Patient declined    Active Member of Clubs or Organizations: Patient declined    Attends Banker Meetings: Patient declined    Marital Status: Married  Catering Manager Violence: Not At Risk (02/09/2024)   Humiliation, Afraid, Rape, and Kick questionnaire    Fear of Current or Ex-Partner: No    Emotionally Abused: No    Physically Abused: No    Sexually Abused: No   Outpatient Medications Prior to Visit  Medication Sig Dispense Refill   acetaminophen  (TYLENOL ) 500 MG tablet Take 500 mg by mouth every 6 (six) hours as needed for mild pain.     allopurinol (ZYLOPRIM) 300 MG tablet Take 300 mg by mouth daily.     aspirin EC 81 MG tablet Take 81 mg by mouth.     gabapentin (NEURONTIN) 100 MG capsule Take 200 mg by mouth 2 (two) times daily.     levothyroxine  (SYNTHROID , LEVOTHROID) 100 MCG tablet Take 100 mcg by mouth daily before breakfast.     meclizine (ANTIVERT) 25 MG tablet Take 25 mg by mouth 3 (three) times daily.     Melatonin 10 MG TABS Take 1 tablet by mouth at bedtime.     colchicine 0.6 MG tablet Take 0.6 mg by mouth as needed.  (Patient not taking: Reported on 02/14/2024)     dasatinib (SPRYCEL) 100 MG tablet Take 1 tablet (100 mg total) by mouth daily. (Patient not taking: Reported on 02/14/2024) 30 tablet 2   furosemide (LASIX) 20 MG tablet Take 20 mg by mouth daily. (Patient not taking: Reported on 02/14/2024)     hydrochlorothiazide  (HYDRODIURIL ) 12.5 MG tablet Take 12.5 mg by mouth daily. (Patient not taking: Reported on 02/14/2024)     lisinopril  (ZESTRIL ) 40 MG tablet Take 40 mg by mouth daily. (Patient not taking: Reported on 02/14/2024)     metFORMIN  (GLUCOPHAGE ) 500 MG tablet Take 500 mg by mouth every morning.  (Patient not taking: Reported on 02/14/2024)     spironolactone (ALDACTONE) 25 MG tablet Take 25 mg by mouth daily. (Patient not taking: Reported on 02/14/2024)     No facility-administered medications prior to visit.   Allergies  Allergen Reactions   Alendronate Diarrhea    Other reaction(s): Dizziness, Other (See Comments) Joint pain   Nadolol     Other reaction(s): Dizziness Per patient, Felt like the back of head was going to explode   Statins     Other reaction(s): Muscle Pain Mevacor-myalgias.  Crestor-Just didn't feel right.     ROS      Objective   Today's Vitals   02/21/24 1426  BP: (!) 175/66  Pulse: (!) 57  SpO2: 97%  Weight: 156 lb (70.8 kg)  Height: 5' 6 (1.676 m)  PainSc: 0-No pain    GENERAL: Well-appearing, in NAD. Well nourished.  SKIN: Pink, warm and dry. No rash, lesion, ulceration, or ecchymoses.  Head: Normocephalic. NECK: Trachea midline. Full ROM w/o pain or tenderness. No lymphadenopathy.  EARS: Tympanic membranes are intact, translucent without bulging and without drainage. Appropriate landmarks visualized.  EYES: Conjunctiva clear without exudates. EOMI, PERRL, no drainage present.  NOSE: Septum midline w/o deformity. Nares patent, mucosa pink and non-inflamed w/o drainage. No sinus tenderness.  THROAT: Uvula midline. Oropharynx clear. Tonsils  non-inflamed without exudate. Mucous membranes pink and moist.  RESPIRATORY: Chest wall symmetrical. Respirations even  and non-labored. Breath sounds clear to auscultation bilaterally.  CARDIAC: S1, S2 present, regular rate and rhythm without murmur or gallops. Peripheral pulses 2+ bilaterally.  MSK: Muscle tone and strength appropriate for age. Joints w/o tenderness, redness, or swelling.  EXTREMITIES: Without clubbing, cyanosis, or edema.  NEUROLOGIC: No motor or sensory deficits. Steady, even gait. C2-C12 intact.  PSYCH/MENTAL STATUS: Alert, oriented x 3. Cooperative, appropriate mood and affect.       Assessment & Plan:   ***  Return in about 4 weeks (around 03/20/2024) for HTN & edema follow-up.   Evalene Arts, FNP

## 2024-02-21 NOTE — Telephone Encounter (Signed)
Provider has been advised.

## 2024-02-21 NOTE — Patient Instructions (Signed)

## 2024-02-21 NOTE — Telephone Encounter (Signed)
 Copied from CRM #[id number]. Topic: General - Other >> Feb 21, 2024  1:07 PM Sophia H wrote: Reason for CRM: Kate Lesches - Physical Therapist calling to report at todays PT patients resting heart rate was 53 BPM. Patient was asymptomatic, please reach out if any questions # [phone].

## 2024-02-22 ENCOUNTER — Encounter: Payer: Self-pay | Admitting: Family Medicine

## 2024-02-22 LAB — HEMOGLOBIN A1C
Est. average glucose Bld gHb Est-mCnc: 123 mg/dL
Hgb A1c MFr Bld: 5.9 % — ABNORMAL HIGH (ref 4.8–5.6)

## 2024-02-22 LAB — BASIC METABOLIC PANEL WITH GFR
BUN/Creatinine Ratio: 20 (ref 12–28)
BUN: 19 mg/dL (ref 8–27)
CO2: 22 mmol/L (ref 20–29)
Calcium: 9.4 mg/dL (ref 8.7–10.3)
Chloride: 105 mmol/L (ref 96–106)
Creatinine, Ser: 0.95 mg/dL (ref 0.57–1.00)
Glucose: 100 mg/dL — ABNORMAL HIGH (ref 70–99)
Potassium: 4.7 mmol/L (ref 3.5–5.2)
Sodium: 140 mmol/L (ref 134–144)
eGFR: 59 mL/min/1.73 — ABNORMAL LOW (ref >59)

## 2024-02-24 ENCOUNTER — Ambulatory Visit: Payer: Self-pay | Admitting: Family Medicine

## 2024-02-24 ENCOUNTER — Inpatient Hospital Stay: Admitting: Oncology

## 2024-02-24 ENCOUNTER — Inpatient Hospital Stay

## 2024-02-24 DIAGNOSIS — I872 Venous insufficiency (chronic) (peripheral): Secondary | ICD-10-CM

## 2024-02-24 DIAGNOSIS — I890 Lymphedema, not elsewhere classified: Secondary | ICD-10-CM

## 2024-02-24 MED ORDER — FUROSEMIDE 20 MG PO TABS
ORAL_TABLET | ORAL | 2 refills | Status: AC
Start: 2024-02-24 — End: ?

## 2024-03-01 ENCOUNTER — Telehealth: Payer: Self-pay

## 2024-03-01 NOTE — Telephone Encounter (Signed)
 Outbound call to check status of referral sent 01/25/24 for CKD.  Spoke to [place] who indicated patient was scheduled to see Dr. Marcelino on 02/10/24 at 3:20pm but the patient cancelled the appointment.

## 2024-03-07 ENCOUNTER — Inpatient Hospital Stay: Admitting: Oncology

## 2024-03-07 ENCOUNTER — Encounter: Payer: Self-pay | Admitting: Oncology

## 2024-03-07 ENCOUNTER — Inpatient Hospital Stay: Attending: Oncology

## 2024-03-07 VITALS — BP 138/72 | HR 55 | Temp 96.3°F | Resp 17 | Wt 153.7 lb

## 2024-03-07 DIAGNOSIS — C9210 Chronic myeloid leukemia, BCR/ABL-positive, not having achieved remission: Secondary | ICD-10-CM | POA: Insufficient documentation

## 2024-03-07 DIAGNOSIS — C50911 Malignant neoplasm of unspecified site of right female breast: Secondary | ICD-10-CM

## 2024-03-07 DIAGNOSIS — Z86000 Personal history of in-situ neoplasm of breast: Secondary | ICD-10-CM | POA: Insufficient documentation

## 2024-03-07 DIAGNOSIS — Z7189 Other specified counseling: Secondary | ICD-10-CM | POA: Diagnosis not present

## 2024-03-07 LAB — CBC WITH DIFFERENTIAL (CANCER CENTER ONLY)
Abs Immature Granulocytes: 2.57 K/uL — ABNORMAL HIGH (ref 0.00–0.07)
Basophils Absolute: 0.8 K/uL — ABNORMAL HIGH (ref 0.0–0.1)
Basophils Relative: 5 %
Eosinophils Absolute: 0.4 K/uL (ref 0.0–0.5)
Eosinophils Relative: 3 %
HCT: 33.4 % — ABNORMAL LOW (ref 36.0–46.0)
Hemoglobin: 10.7 g/dL — ABNORMAL LOW (ref 12.0–15.0)
Immature Granulocytes: 15 %
Lymphocytes Relative: 24 %
Lymphs Abs: 4.1 K/uL — ABNORMAL HIGH (ref 0.7–4.0)
MCH: 28.2 pg (ref 26.0–34.0)
MCHC: 32 g/dL (ref 30.0–36.0)
MCV: 87.9 fL (ref 80.0–100.0)
Monocytes Absolute: 1.8 K/uL — ABNORMAL HIGH (ref 0.1–1.0)
Monocytes Relative: 11 %
Neutro Abs: 7 K/uL (ref 1.7–7.7)
Neutrophils Relative %: 42 %
Platelet Count: 338 K/uL (ref 150–400)
RBC: 3.8 MIL/uL — ABNORMAL LOW (ref 3.87–5.11)
RDW: 17.2 % — ABNORMAL HIGH (ref 11.5–15.5)
Smear Review: NORMAL
WBC Count: 16.7 K/uL — ABNORMAL HIGH (ref 4.0–10.5)
nRBC: 0.1 % (ref 0.0–0.2)

## 2024-03-07 LAB — CMP (CANCER CENTER ONLY)
ALT: 14 U/L (ref 0–44)
AST: 28 U/L (ref 15–41)
Albumin: 4 g/dL (ref 3.5–5.0)
Alkaline Phosphatase: 109 U/L (ref 38–126)
Anion gap: 9 (ref 5–15)
BUN: 12 mg/dL (ref 8–23)
CO2: 27 mmol/L (ref 22–32)
Calcium: 9.7 mg/dL (ref 8.9–10.3)
Chloride: 106 mmol/L (ref 98–111)
Creatinine: 1.25 mg/dL — ABNORMAL HIGH (ref 0.44–1.00)
GFR, Estimated: 43 mL/min — ABNORMAL LOW (ref >60)
Glucose, Bld: 106 mg/dL — ABNORMAL HIGH (ref 70–99)
Potassium: 4.8 mmol/L (ref 3.5–5.1)
Sodium: 142 mmol/L (ref 135–145)
Total Bilirubin: 0.3 mg/dL (ref 0.0–1.2)
Total Protein: 6.7 g/dL (ref 6.5–8.1)

## 2024-03-09 ENCOUNTER — Telehealth: Payer: Self-pay | Admitting: Family Medicine

## 2024-03-09 ENCOUNTER — Encounter: Payer: Self-pay | Admitting: Family Medicine

## 2024-03-09 ENCOUNTER — Ambulatory Visit: Admitting: Family Medicine

## 2024-03-09 VITALS — BP 158/64 | HR 56 | Resp 16 | Ht 66.0 in | Wt 153.0 lb

## 2024-03-09 DIAGNOSIS — I700 Atherosclerosis of aorta: Secondary | ICD-10-CM | POA: Insufficient documentation

## 2024-03-09 DIAGNOSIS — I872 Venous insufficiency (chronic) (peripheral): Secondary | ICD-10-CM | POA: Diagnosis not present

## 2024-03-09 DIAGNOSIS — I6522 Occlusion and stenosis of left carotid artery: Secondary | ICD-10-CM | POA: Insufficient documentation

## 2024-03-09 DIAGNOSIS — E7800 Pure hypercholesterolemia, unspecified: Secondary | ICD-10-CM | POA: Diagnosis not present

## 2024-03-09 DIAGNOSIS — I351 Nonrheumatic aortic (valve) insufficiency: Secondary | ICD-10-CM | POA: Diagnosis not present

## 2024-03-09 DIAGNOSIS — N1832 Chronic kidney disease, stage 3b: Secondary | ICD-10-CM

## 2024-03-09 DIAGNOSIS — N184 Chronic kidney disease, stage 4 (severe): Secondary | ICD-10-CM | POA: Insufficient documentation

## 2024-03-09 DIAGNOSIS — I10 Essential (primary) hypertension: Secondary | ICD-10-CM

## 2024-03-09 NOTE — Progress Notes (Signed)
 Established Patient Office Visit  Subjective  Patient ID: Sydney Parks, female    DOB: [birth year]  Age: 83 y.o. MRN: [id number]  Chief Complaint  Patient presents with   Chronic Kidney Disease   Discussed the use of AI scribe software for clinical note transcription with the patient, who gave verbal consent to proceed.  History of Present Illness   Sydney Parks is an 83 year old female with hypertension who presents with concerns about leg swelling and blood pressure management.  She has experienced elevated blood pressure readings, with discrepancies noted between manual and machine readings at a recent cancer center visit. At home, her blood pressure typically ranges from 140-150 mmHg, but she has stopped regular monitoring.  She experiences swelling in her feet, particularly at night, which resolves by morning. She uses Lasix  as needed for the swelling, which helps by increasing urination frequency. She is concerned about its impact on her kidney function. She has not been using her compression socks regularly due to difficulty putting them on because of arthritis in her fingers, and her son assists her with this task. She considers using older, less tight socks instead. Previous ultrasounds of her legs were negative for blood clots. No pain or redness in her legs is noted. She does have a history of chronic venous insufficiency and lymphedema. She was a prior smoker for 10 years.   Her physical activity is limited; she walks around the house and to the mailbox when the weather permits. No shortness of breath with these activities. She does not add extra salt to her food but does not eliminate it entirely from her diet.  She mentions a recent visit to the cancer doctor, where it was decided that no new treatment would start until after the new year. Blood work was done, and she was informed that her 'C cells had diminished some.'  She has a history of syncopal episodes and was  previously on cholesterol medication (Crestor), which she discontinued due to muscle pain. Recent lipid panel with elevated total cholesterol and LDL.      ROS: see HPI     Objective:    BP (!) 158/64   Pulse (!) 56   Resp 16   Ht 5' 6 (1.676 m)   Wt 153 lb (69.4 kg)   SpO2 98%   BMI 24.69 kg/m  BP Readings from Last 3 Encounters:  03/09/24 (!) 158/64  03/07/24 138/72  02/21/24 (!) 142/62    Physical Exam Vitals reviewed.  Constitutional:      Appearance: Normal appearance.  Neck:     Vascular: Normal carotid pulses. Carotid bruit (L side) present.  Cardiovascular:     Rate and Rhythm: Regular rhythm. Bradycardia present.     Pulses: Normal pulses.     Heart sounds: Murmur heard.     Diastolic murmur is present with a grade of 2/4.  Pulmonary:     Effort: Pulmonary effort is normal.     Breath sounds: Normal breath sounds.  Musculoskeletal:     Right lower leg: 1+ Pitting Edema present.     Left lower leg: 1+ Pitting Edema present.  Neurological:     Mental Status: She is alert.     Assessment & Plan:   1. Chronic venous insufficiency (Primary) Chronic lymphedema of bilateral lower extremities, primarily affecting ankles, with swelling more pronounced at night. Most recent echo showed LVEF >55% (05/12/2022). Differential includes peripheral vascular disease or venous insufficiency. Previous ultrasounds negative  for blood clots. Denies current lower extremity erythema, warmth and claudication. Discussed using Lasix  sparingly, every other day or every two days, in the morning, to help with the swelling. Discussed wearing compression stockings with 15-20 mmHg pressure every day and to elevate her feet regularly. Discussed monitoring her sodium intake and increasing her physical activity of walking.  Consider referral to vascular specialist for further evaluation.  2. Stenosis of left carotid artery Turbulent blood flow in left carotid artery present during physical exam  today, possibly indicating stenosis or occlusion. Potential relation to past syncopal episodes and smoking history discussed. Ordered ultrasound of the neck to assess for carotid artery stenosis.  - US  Carotid Duplex Bilateral; Future  3. Hypercholesteremia Most recent lipid panel completed with total cholesterol of 295 and LDL 185. She did not tolerate statin medication in the past. She may be a good candidate Repatha. Will discuss with patient at next visit.   4. 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 No acute cardiac issues identified. Continue Lasix  as per lymphedema management plan. Continue wearing compression stockings. Followed by cardiology with most recent visit 11/2023.   5. CKD (chronic kidney disease) stage 3b, GFR 30-44 ml/min (HCC) Serum creatinine 1.25 with eGFR 43, about 2 weeks ago her eGFR was 59. Discussed monitoring kidney function closely. May be reasonable to send patient to nephrology for further management.   6. Atherosclerosis of aorta See #3  7. Primary hypertension Chronic hypertension. Blood pressure previously stable around 140-150 mmHg systolic. Initial blood pressure reading was 193/76, repeat BP reading was 158/64. Importance of maintaining blood pressure below 150 mmHg to protect kidney function discussed. Monitor blood pressure at home two to three times a week. Ensure blood pressure does not exceed 150 mmHg systolic and to return to office if blood pressure is consistently elevated. Will continue to hold blood pressure medications at this time due to kidney function. May be reasonable to place referral to nephrology at this time.   Return in about 4 weeks (around 04/06/2024) for HTN follow-up.    Evalene Arts, FNP

## 2024-03-09 NOTE — Patient Instructions (Signed)

## 2024-03-09 NOTE — Telephone Encounter (Signed)
 Called pts daughter per Dr. Thressa request. Dr. Evalene Arts would like to see pt back in office in 2-4 weeks.

## 2024-03-12 NOTE — Progress Notes (Signed)
 Hematology/Oncology Consult note [facility]  Telephone:([phone] [phone]  Patient Care Team: Towana Small, FNP as PCP - General (Family Medicine) Claudene Larinda Bolder, MD (Inactive) as Referring Physician (Surgery) Georgina Shasta POUR, RN as Oncology Nurse Navigator Melanee Annah BROCKS, MD as Consulting Physician (Oncology)   Name of the patient: Sydney Parks  [id number]  1940-11-06   Date of visit: 03/12/24  Diagnosis- 1, chronic phase CML 2.  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  She had lumpectomy, postlumpectomy radiation and 5 years of tamoxifen back then.  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  Tumor was T1b N0 M0.  She underwent mastectomy and was started on letrozole  by Dr. Wilder.  She did not require postmastectomy radiation or chemotherapy.  It appears that patient did not complete 5 years of letrozole  and was lost to follow-up.  Patient self palpated a mass along her mastectomy scar which led toLeft breast mammogram as well as right chest wall ultrasound.  Mammogram showed 1.3 cm medial right breast/chest mass which contacts and appears to invade underlying pectoralis muscle.  No abnormal appearing right axillary lymph nodes.  No mammographic appearance of left breast malignancy.   Systemic scans were negative for metastatic disease.  Patient was seen by Dr. Iva at [facility] and underwent definitive excision of the postmastectomy recurrence.  Final pathSurgery showed 1.6 cm grade 2 invasive mammary carcinoma with negative margins.  ER 100% positive, PR 80% positive and HER2 negative.  Patient was also seen by radiation oncology at [facility] and declined adjuvant radiation. She started letrozole  in April 2024  but stopped subsequently due to intolerance   Patient was seen by NP Tinnie Dawn in August 2025 for evaluation of leukocytosis.Flow cytometry showed leukocytosis with left shifted aberrant granulocytes and monocytes as well as absolute basophilia and eosinophilia concerning for myeloproliferative process.  No increase in peripheral blasts.  BCR-ABL FISH testing was consistent with 71.5% nuclei positive for BCR-ABL gene fusion signals consistent with CML.   Patient was started on Dasatinib  but 2 days after starting the drug patient was admitted to the hospital for generalized fatigue, worsening AKI and patient decided not to take Dasatinib   Interval history- Discussed the use of AI scribe software for clinical note transcription with the patient, who gave verbal consent to proceed.  History of Present Illness   Sydney Parks is an 83 year old female with chronic myeloid leukemia who presents for follow-up and discussion of treatment options.  Her creatinine level has increased from 0.9 two weeks ago to 1.2 today. White blood cell count has risen to 16.7, while hemoglobin has improved from 8.8 three weeks ago to 10.7. Platelet count remains stable at 338.  She previously tried a medication for chronic myeloid leukemia but stopped taking it after only two days. She has received a new supply of medication, which remains unopened.  She feels more emotional than usual, attributing it to the stress and uncertainty surrounding her leukemia diagnosis and recent hospital experiences.       ECOG PS- 2 Pain scale- 0   Review of systems- Review of Systems  Constitutional:  Positive for malaise/fatigue. Negative for chills, fever and weight loss.  HENT:  Negative for congestion, ear discharge  and nosebleeds.   Eyes:  Negative for blurred vision.  Respiratory:  Negative for cough, hemoptysis, sputum production, shortness of breath and wheezing.   Cardiovascular:  Negative for chest pain, palpitations,  orthopnea and claudication.  Gastrointestinal:  Negative for abdominal pain, blood in stool, constipation, diarrhea, heartburn, melena, nausea and vomiting.  Genitourinary:  Negative for dysuria, flank pain, frequency, hematuria and urgency.  Musculoskeletal:  Negative for back pain, joint pain and myalgias.  Skin:  Negative for rash.  Neurological:  Negative for dizziness, tingling, focal weakness, seizures, weakness and headaches.  Endo/Heme/Allergies:  Does not bruise/bleed easily.  Psychiatric/Behavioral:  Negative for depression and suicidal ideas. The patient does not have insomnia.       Allergies  Allergen Reactions   Alendronate Diarrhea    Other reaction(s): Dizziness, Other (See Comments) Joint pain   Nadolol     Other reaction(s): Dizziness Per patient, Felt like the back of head was going to explode   Statins     Other reaction(s): Muscle Pain Mevacor-myalgias.  Crestor-Just didn't feel right.     Past Medical History:  Diagnosis Date   Arthritis    Breast cancer (HCC) 2000 and 2016   right breast ca twice.  Mastectomy in 2016   Cataract    Diabetes mellitus without complication (HCC)    GERD (gastroesophageal reflux disease)    Hx of migraines    from ages [age]   Hypercholesteremia    Hypertension    Kidney stone on right side    Personal history of radiation therapy 2000   right breast ca   Thyroid disease    Varicose vein of leg      Past Surgical History:  Procedure Laterality Date   BREAST BIOPSY Right 2000   breast ca   BREAST BIOPSY Right 2016   invasive mammary-mastectomy   BREAST BIOPSY Right 05/27/2022   US  RT BREAST BX W LOC DEV 1ST LESION IMG BX SPEC US  GUIDE 05/27/2022 [facility]   BREAST LUMPECTOMY Right 2000   breast ca with rad tx and tamoxifen   CATARACT EXTRACTION W/ INTRAOCULAR LENS  IMPLANT, BILATERAL     MASTECTOMY Right 2016   + complete mastectomy and letrozole  tx   SIMPLE MASTECTOMY WITH AXILLARY SENTINEL NODE  BIOPSY Right 08/21/2014   Procedure: SIMPLE MASTECTOMY WITH AXILLARY SENTINEL NODE BIOPSY;  Surgeon: Unknown Sharps, MD;  Location: [facility] ORS;  Service: General;  Laterality: Right;   TONSILLECTOMY     VARICOSE VEIN SURGERY Left    laser surgery    Social History   Socioeconomic History   Marital status: Married    Spouse name: Not on file   Number of children: Not on file   Years of education: Not on file   Highest education level: Not on file  Occupational History   Not on file  Tobacco Use   Smoking status: Former    Current packs/day: 0.00    Average packs/day: 0.5 packs/day for 10.0 years (5.0 ttl pk-yrs)    Types: Cigarettes    Start date: 08/04/1960    Quit date: 08/05/1970    Years since quitting: 53.6   Smokeless tobacco: Never  Vaping Use   Vaping status: Never Used  Substance and Sexual Activity   Alcohol use: No   Drug use: No   Sexual activity: Yes    Birth control/protection: Post-menopausal  Other Topics Concern   Not on file  Social History Narrative   Not on file   Social Drivers  of Health   Financial Resource Strain: Low Risk  (09/14/2023)   Received from [facility]   Overall Financial Resource Strain (CARDIA)    Difficulty of Paying Living Expenses: Not hard at all  Food Insecurity: No Food Insecurity (02/09/2024)   Hunger Vital Sign    Worried About Running Out of Food in the Last Year: Never true    Ran Out of Food in the Last Year: Never true  Transportation Needs: No Transportation Needs (02/09/2024)   PRAPARE - Administrator, Civil Service (Medical): No    Lack of Transportation (Non-Medical): No  Physical Activity: Not on file  Stress: Not on file  Social Connections: Unknown (02/09/2024)   Social Connection and Isolation Panel    Frequency of Communication with Friends and Family: More than three times a week    Frequency of Social Gatherings with Friends and Family: Three times a week    Attends Religious  Services: Patient declined    Active Member of Clubs or Organizations: Patient declined    Attends Banker Meetings: Patient declined    Marital Status: Married  Catering Manager Violence: Not At Risk (02/09/2024)   Humiliation, Afraid, Rape, and Kick questionnaire    Fear of Current or Ex-Partner: No    Emotionally Abused: No    Physically Abused: No    Sexually Abused: No    Family History  Problem Relation Age of Onset   Osteoarthritis Mother    Heart disease Father    Dementia Sister    Breast cancer Daughter 13     Current Outpatient Medications:    acetaminophen  (TYLENOL ) 500 MG tablet, Take 500 mg by mouth every 6 (six) hours as needed for mild pain., Disp: , Rfl:    allopurinol (ZYLOPRIM) 300 MG tablet, Take 300 mg by mouth daily., Disp: , Rfl:    aspirin EC 81 MG tablet, Take 81 mg by mouth., Disp: , Rfl:    furosemide  (LASIX ) 20 MG tablet, Take 1 tablet, as needed, if you notice an increase in your weight (2-3lbs in a day) or for increase in edema., Disp: 30 tablet, Rfl: 2   gabapentin (NEURONTIN) 100 MG capsule, Take 200 mg by mouth 2 (two) times daily., Disp: , Rfl:    levothyroxine  (SYNTHROID , LEVOTHROID) 100 MCG tablet, Take 100 mcg by mouth daily before breakfast., Disp: , Rfl:    meclizine (ANTIVERT) 25 MG tablet, Take 25 mg by mouth 3 (three) times daily., Disp: , Rfl:    Melatonin 10 MG TABS, Take 1 tablet by mouth at bedtime., Disp: , Rfl:    colchicine 0.6 MG tablet, Take 0.6 mg by mouth as needed. (Patient not taking: Reported on 03/09/2024), Disp: , Rfl:    [Paused] dasatinib  (SPRYCEL ) 100 MG tablet, Take 1 tablet (100 mg total) by mouth daily. (Patient not taking: Reported on 03/09/2024), Disp: 30 tablet, Rfl: 2   [Paused] hydrochlorothiazide  (HYDRODIURIL ) 12.5 MG tablet, Take 12.5 mg by mouth daily. (Patient not taking: Reported on 03/09/2024), Disp: , Rfl:    [Paused] lisinopril  (ZESTRIL ) 40 MG tablet, Take 40 mg by mouth daily. (Patient not taking:  Reported on 03/09/2024), Disp: , Rfl:    [Paused] metFORMIN  (GLUCOPHAGE ) 500 MG tablet, Take 500 mg by mouth every morning.  (Patient not taking: Reported on 03/09/2024), Disp: , Rfl:    [Paused] spironolactone (ALDACTONE) 25 MG tablet, Take 25 mg by mouth daily. (Patient not taking: Reported on 03/09/2024), Disp: , Rfl:  Physical exam:  Vitals:   03/07/24 1520  BP: 138/72  Pulse: (!) 55  Resp: 17  Temp: (!) 96.3 F (35.7 C)  SpO2: 98%  Weight: 153 lb 11.2 oz (69.7 kg)   Physical Exam Cardiovascular:     Rate and Rhythm: Normal rate and regular rhythm.     Heart sounds: Normal heart sounds.  Pulmonary:     Effort: Pulmonary effort is normal.     Breath sounds: Normal breath sounds.  Skin:    General: Skin is warm and dry.  Neurological:     Mental Status: She is alert and oriented to person, place, and time.      I have personally reviewed labs listed below:    Latest Ref Rng & Units 03/07/2024    2:54 PM  CMP  Glucose 70 - 99 mg/dL 893   BUN 8 - 23 mg/dL 12   Creatinine 9.55 - 1.00 mg/dL 8.74   Sodium 864 - 854 mmol/L 142   Potassium 3.5 - 5.1 mmol/L 4.8   Chloride 98 - 111 mmol/L 106   CO2 22 - 32 mmol/L 27   Calcium  8.9 - 10.3 mg/dL 9.7   Total Protein 6.5 - 8.1 g/dL 6.7   Total Bilirubin 0.0 - 1.2 mg/dL 0.3   Alkaline Phos 38 - 126 U/L 109   AST 15 - 41 U/L 28   ALT 0 - 44 U/L 14       Latest Ref Rng & Units 03/07/2024    2:54 PM  CBC  WBC 4.0 - 10.5 K/uL 16.7   Hemoglobin 12.0 - 15.0 g/dL 89.2   Hematocrit 63.9 - 46.0 % 33.4   Platelets 150 - 400 K/uL 338    I have personally reviewed Radiology images listed below: No images are attached to the encounter.  No results found.   Assessment and plan- Patient is a 83 y.o. female with history of chronic phase CML here for further discussion  Assessment and Plan    Chronic myeloid leukemia, BCR/ABL-positive - CML with BCR/ABL positivity. WBC 16.7, hemoglobin 10.7, platelets 338. Creatinine increased to  1.2, not concerning.  Patient was not able to tolerate Dasatinib  within 2 days of initiating the drug.  - Untreated CML overallI portends poor prognosis with risk of progression to acute leukemia.  Untreated CML carries a mortality of 10 to 20 %/year and average historical lifespan of about 3 to 5 years.  Less than 50% of the patients live for greater than 5 years.  Discussed imatinib side effects and prognosis improvement. Patient and family prefer to delay decision until after holidays which I think is okay at this time since her white cell count is mildly elevated and hemoglobin and platelets are stable.. - Consider starting imatinib after first week of January. - Repeat labs and BCR-ABL in first week of January. - Do not order imatinib now. - Monitor symptoms, return for blood work if unwell.         Visit Diagnosis 1. CML (chronic myelocytic leukemia) (HCC)   2. Goals of care, counseling/discussion      Dr. Annah Skene, MD, MPH [facility] at [facility] [phone] 03/12/2024 6:36 PM

## 2024-03-20 ENCOUNTER — Ambulatory Visit: Payer: Self-pay | Admitting: *Deleted

## 2024-03-20 NOTE — Telephone Encounter (Signed)
.  Spoke to daughter she will monitor BP as patient does not feel distressed and wants to wait for PCP review. Will call daughter in AM with medication advise. Did STRONGLY advise ED if Dizziness worsens, headache, swelling, NVD, or confussion starts to be an issue.

## 2024-03-20 NOTE — Telephone Encounter (Signed)
 No available appt with PCP today or until 03/24/24. Last OV 03/09/24. Please advise if patient should restart any BP medications, due to multiple are on paused. Patient daughter requesting call back. See NT encounter.   [facility] called and unable to notify of patient not wanting to go to UC.     FYI Only or Action Required?: FYI only for provider: recommended UC and patient daughter requesting PCP to review BP medication and see if patient needs to restart medications before going to UC or ED. SABRA  Patient was last seen in primary care on 03/09/2024 by Towana Small, FNP.  Called Nurse Triage reporting Hypertension.  Symptoms began today.  Interventions attempted: Nothing.  Symptoms are: gradually worsening.  Triage Disposition: See HCP Within 4 Hours (Or PCP Triage)  Patient/caregiver understands and will follow disposition?: No, wishes to speak with PCP                Copied from CRM [id number]. Topic: Clinical - Red Word Triage >> Mar 20, 2024 11:58 AM Montie POUR wrote: Red Word that prompted transfer to Nurse Triage:  Curlee with [facility] is with Ms. Kunz and is calling because Ms. Bowen's blood pressure is at 200/79. He did retake manually and it was 196/70. Reason for Disposition  [1] Systolic BP >= 200 OR Diastolic >= 120 AND [2] having NO cardiac or neurologic symptoms  Answer Assessment - Initial Assessment Questions Recommended UC due to elevated BP and lightheaded last night. No available appt today or tomorrow. Patient daughter on phone as well as Curlee, PT and reports patient has been paused on taking multiple medications due to being on 3 fluid pills. Please advise if patient should restart any medications and if OV can be scheduled today. Please call patient Daughter on DPR.         1. BLOOD PRESSURE: What is your blood pressure? Did you take at least two measurements 5 minutes apart?     BP 196/70 prior to call and rechecked for BP  200/72 2. ONSET: When did you take your blood pressure?     Now  3. HOW: How did you take your blood pressure? (e.g., automatic home BP monitor, visiting nurse)     PT checked manual BP  4. HISTORY: Do you have a history of high blood pressure?     Yes  5. MEDICINES: Are you taking any medicines for blood pressure? Have you missed any doses recently?     Has not been taking BP medications per patient daughter 63. OTHER SYMPTOMS: Do you have any symptoms? (e.g., blurred vision, chest pain, difficulty breathing, headache, weakness)     Last night felt like strong pulse, and lightheaded. Today denies sx but BP elevated with PT, Juan  7. PREGNANCY: Is there any chance you are pregnant? When was your last menstrual period?     na  Protocols used: Blood Pressure - High-A-AH

## 2024-03-21 ENCOUNTER — Ambulatory Visit: Admitting: Family Medicine

## 2024-03-21 ENCOUNTER — Telehealth: Payer: Self-pay | Admitting: Family Medicine

## 2024-03-21 ENCOUNTER — Other Ambulatory Visit: Payer: Self-pay | Admitting: Family Medicine

## 2024-03-21 DIAGNOSIS — I10 Essential (primary) hypertension: Secondary | ICD-10-CM

## 2024-03-21 MED ORDER — HYDRALAZINE HCL 10 MG PO TABS: 10.0000 mg | ORAL_TABLET | Freq: Two times a day (BID) | ORAL | 2 refills | Status: AC

## 2024-03-21 NOTE — Telephone Encounter (Signed)
 Copied from CRM [id number]. Topic: Clinical - Red Word Triage >> Mar 20, 2024 11:58 AM Montie POUR wrote: Red Word that prompted transfer to Nurse Triage:  Curlee with [facility] is with Ms. Durfey and is calling because Ms. Azucena's blood pressure is at 200/79. He did retake manually and it was 196/70. >> Mar 21, 2024  9:49 AM Marylynn H wrote: Daughter Evalene called back in to see if PCP has had a chance to review messages from yesterday regarding red word. No changes since yesterday, BP before bed was 185/76. Evalene states patient has not started back up on her BP meds since she was taken off prior to switching doctors so she is unsure if maybe that is why she is having issues with BP now. Please reach out, thank you.  # [phone]

## 2024-03-21 NOTE — Telephone Encounter (Signed)
 Copied from CRM [id number]. Topic: Clinical - Red Word Triage >> Mar 20, 2024 11:58 AM Montie POUR wrote: Red Word that prompted transfer to Nurse Triage:  Sydney Parks with [facility] is with Sydney Parks and is calling because Sydney Parks's blood pressure is at 200/79. He did retake manually and it was 196/70. >> Mar 21, 2024  9:49 AM Marylynn H wrote: Daughter Sydney Parks called back in to see if PCP has had a chance to review messages from yesterday regarding red word. No changes since yesterday, BP before bed was 185/76. Sydney Parks states patient has not started back up on her BP meds since she was taken off prior to switching doctors so she is unsure if maybe that is why she is having issues with BP now. Please reach out, thank you.  # [phone]

## 2024-03-21 NOTE — Telephone Encounter (Signed)
 Returned call to patient's daughter, Sydney Parks, regarding her mother's recent blood pressure readings. This morning she reports that Sydney Parks's blood pressure has decreased to 151/58 and the most recent reading was 148/56 with HR 68. Discussed that this is most likely related to her stopping her blood pressure medications since her acute kidney injury superimposed on history of CKD. Will start hydralazine  10mg  BID initially. Rx sent to pharmacy on file. Advised patient to check blood pressure readings 30-60 minutes after taking blood pressure medication to determine its effectiveness in controlling her hypertension. All questions answered and patient's daughter verbalized an understanding.   Sydney Parks Arts, FNP-C [facility]  [address], [place], [place] [address] [phone]

## 2024-03-22 ENCOUNTER — Telehealth: Payer: Self-pay | Admitting: Family Medicine

## 2024-03-22 NOTE — Telephone Encounter (Signed)
 Copied from CRM #[id number]. Topic: Clinical - Home Health Verbal Orders >> Mar 21, 2024 11:17 AM Zy'onna H wrote: Caller/Agency: Donn GLENWOOD Daved Mitch Number: [phone] Service Requested: Skilled Nursing Frequency: will be determined by Nurse's Evaluation of the patient (1-2x per week)  Any new concerns about the patient? Yes, he BP has been elevated and they want to  Monitor BP for a few weeks.

## 2024-04-04 ENCOUNTER — Encounter: Payer: Self-pay | Admitting: Family Medicine

## 2024-04-12 ENCOUNTER — Inpatient Hospital Stay: Admitting: Pharmacist

## 2024-04-12 ENCOUNTER — Ambulatory Visit
Admission: RE | Admit: 2024-04-12 | Discharge: 2024-04-12 | Disposition: A | Discharge status: Home / Self Care | Source: Ambulatory Visit | Attending: Family Medicine | Admitting: Family Medicine

## 2024-04-12 ENCOUNTER — Telehealth: Payer: Self-pay | Admitting: Pharmacy Technician

## 2024-04-12 ENCOUNTER — Encounter: Payer: Self-pay | Admitting: Oncology

## 2024-04-12 ENCOUNTER — Inpatient Hospital Stay: Attending: Oncology

## 2024-04-12 ENCOUNTER — Inpatient Hospital Stay: Admitting: Oncology

## 2024-04-12 ENCOUNTER — Other Ambulatory Visit (HOSPITAL_COMMUNITY): Payer: Self-pay

## 2024-04-12 VITALS — BP 132/68 | HR 56 | Temp 97.6°F | Resp 20 | Wt 148.9 lb

## 2024-04-12 DIAGNOSIS — Z79899 Other long term (current) drug therapy: Secondary | ICD-10-CM | POA: Diagnosis not present

## 2024-04-12 DIAGNOSIS — C9210 Chronic myeloid leukemia, BCR/ABL-positive, not having achieved remission: Secondary | ICD-10-CM | POA: Insufficient documentation

## 2024-04-12 DIAGNOSIS — I6522 Occlusion and stenosis of left carotid artery: Secondary | ICD-10-CM | POA: Insufficient documentation

## 2024-04-12 DIAGNOSIS — E039 Hypothyroidism, unspecified: Secondary | ICD-10-CM

## 2024-04-12 LAB — CMP (CANCER CENTER ONLY)
ALT: 15 U/L (ref 0–44)
AST: 43 U/L — ABNORMAL HIGH (ref 15–41)
Albumin: 4.2 g/dL (ref 3.5–5.0)
Alkaline Phosphatase: 96 U/L (ref 38–126)
Anion gap: 12 (ref 5–15)
BUN: 22 mg/dL (ref 8–23)
CO2: 27 mmol/L (ref 22–32)
Calcium: 10.1 mg/dL (ref 8.9–10.3)
Chloride: 104 mmol/L (ref 98–111)
Creatinine: 1.28 mg/dL — ABNORMAL HIGH (ref 0.44–1.00)
GFR, Estimated: 41 mL/min — ABNORMAL LOW
Glucose, Bld: 144 mg/dL — ABNORMAL HIGH (ref 70–99)
Potassium: 4.6 mmol/L (ref 3.5–5.1)
Sodium: 143 mmol/L (ref 135–145)
Total Bilirubin: 0.2 mg/dL (ref 0.0–1.2)
Total Protein: 7 g/dL (ref 6.5–8.1)

## 2024-04-12 LAB — CBC WITH DIFFERENTIAL (CANCER CENTER ONLY)
Abs Immature Granulocytes: 14.88 K/uL — ABNORMAL HIGH (ref 0.00–0.07)
Basophils Absolute: 5.3 K/uL — ABNORMAL HIGH (ref 0.0–0.1)
Basophils Relative: 11 %
Eosinophils Absolute: 1.6 K/uL — ABNORMAL HIGH (ref 0.0–0.5)
Eosinophils Relative: 3 %
HCT: 37.1 % (ref 36.0–46.0)
Hemoglobin: 12 g/dL (ref 12.0–15.0)
Immature Granulocytes: 30 %
Lymphocytes Relative: 16 %
Lymphs Abs: 7.7 K/uL — ABNORMAL HIGH (ref 0.7–4.0)
MCH: 27.6 pg (ref 26.0–34.0)
MCHC: 32.3 g/dL (ref 30.0–36.0)
MCV: 85.3 fL (ref 80.0–100.0)
Monocytes Absolute: 3 K/uL — ABNORMAL HIGH (ref 0.1–1.0)
Monocytes Relative: 6 %
Neutro Abs: 16.5 K/uL — ABNORMAL HIGH (ref 1.7–7.7)
Neutrophils Relative %: 34 %
Platelet Count: 359 K/uL (ref 150–400)
RBC: 4.35 MIL/uL (ref 3.87–5.11)
RDW: 16.9 % — ABNORMAL HIGH (ref 11.5–15.5)
Smear Review: NORMAL
WBC Count: 48.9 K/uL — ABNORMAL HIGH (ref 4.0–10.5)
nRBC: 0.2 % (ref 0.0–0.2)

## 2024-04-12 MED ORDER — IMATINIB MESYLATE 100 MG PO TABS
200.0000 mg | ORAL_TABLET | Freq: Every day | ORAL | 0 refills | Status: DC
  Filled 2024-04-14: qty 60, 30d supply, fill #0

## 2024-04-12 NOTE — Progress Notes (Signed)
 "    Hematology/Oncology Consult note [facility]  [phone] [phone]  Patient Care Team: Towana Small, FNP as PCP - General (Family Medicine) Claudene Larinda Bolder, MD (Inactive) as Referring Physician (Surgery) Georgina Shasta POUR, RN as Oncology Nurse Navigator Melanee Annah BROCKS, MD as Consulting Physician (Oncology)   Name of the patient: Sydney Parks  [id number]  02/25/1941   Date of visit: 04/12/2024  Diagnosis-chronic phase CML  Chief complaint/ Reason for visit-routine follow-up of CML  Heme/Onc history: patient is a 84 year old female who 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  She had lumpectomy, postlumpectomy radiation and 5 years of tamoxifen back then.  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  Tumor was T1b N0 M0.  She underwent mastectomy and was started on letrozole  by Dr. Wilder.  She did not require postmastectomy radiation or chemotherapy.  It appears that patient did not complete 5 years of letrozole  and was lost to follow-up.  Patient self palpated a mass along her mastectomy scar which led toLeft breast mammogram as well as right chest wall ultrasound.  Mammogram showed 1.3 cm medial right breast/chest mass which contacts and appears to invade underlying pectoralis muscle.  No abnormal appearing right axillary lymph nodes.  No mammographic appearance of left breast malignancy.   Systemic scans were negative for metastatic disease.  Patient was seen by Dr. Iva at [facility] and underwent definitive excision of the postmastectomy recurrence.  Final pathSurgery showed 1.6 cm grade 2 invasive mammary carcinoma with negative margins.  ER 100% positive, PR 80% positive and HER2 negative.  Patient was also seen by radiation oncology at [facility] and declined adjuvant radiation. She started letrozole  in April 2024 but stopped subsequently due to intolerance   Patient was  seen by NP Tinnie Dawn in August 2025 for evaluation of leukocytosis.Flow cytometry showed leukocytosis with left shifted aberrant granulocytes and monocytes as well as absolute basophilia and eosinophilia concerning for myeloproliferative process.  No increase in peripheral blasts.  BCR-ABL FISH testing was consistent with 71.5% nuclei positive for BCR-ABL gene fusion signals consistent with CML.   Patient was started on Dasatinib  but 2 days after starting the drug patient was admitted to the hospital for generalized fatigue, worsening AKI and patient decided not to take Dasatinib   Interval history- KHYA HALLS is an 84 year old female with BCR/ABL-positive chronic myeloid leukemia who presents for oncology follow-up and discussion of tyrosine kinase inhibitor therapy.  She has a history of intolerance to dasatinib , which was discontinued due to rapid onset of severe diarrhea and an episode of syncope. Since cessation of dasatinib , she has experienced gradual symptomatic improvement and currently reports feeling much better, though her appetite remains poor.  She expresses significant anxiety regarding resumption of TKI therapy, specifically fearing recurrence of severe diarrhea and syncope, and is concerned about losing her current improved state.  Laboratory studies today show hemoglobin improved to 12 g/dL from a nadir of 8.8 g/dL in November, with normal platelet count. Her white blood cell count remains markedly elevated at 48 x10^9/L. BCR-ABL transcript level in October was 19%, with repeat testing pending.       ECOG PS- 2 Pain scale- 0   Review of systems- Review of Systems  Constitutional:  Positive for malaise/fatigue. Negative for chills, fever and weight loss.  HENT:  Negative for congestion, ear discharge and nosebleeds.   Eyes:  Negative for blurred vision.  Respiratory:  Negative for cough, hemoptysis, sputum production, shortness of breath and wheezing.   Cardiovascular:   Negative for chest pain, palpitations, orthopnea and claudication.  Gastrointestinal:  Negative for abdominal pain, blood in stool, constipation, diarrhea, heartburn, melena, nausea and vomiting.  Genitourinary:  Negative for dysuria, flank pain, frequency, hematuria and urgency.  Musculoskeletal:  Negative for back pain, joint pain and myalgias.  Skin:  Negative for rash.  Neurological:  Negative for dizziness, tingling, focal weakness, seizures, weakness and headaches.  Endo/Heme/Allergies:  Does not bruise/bleed easily.  Psychiatric/Behavioral:  Negative for depression and suicidal ideas. The patient does not have insomnia.       Allergies[1]   Past Medical History:  Diagnosis Date   Arthritis    Breast cancer (HCC) 2000 and 2016   right breast ca twice.  Mastectomy in 2016   Cataract    Diabetes mellitus without complication (HCC)    GERD (gastroesophageal reflux disease)    Hx of migraines    from ages [age]   Hypercholesteremia    Hypertension    Kidney stone on right side    Personal history of radiation therapy 2000   right breast ca   Thyroid disease    Varicose vein of leg      Past Surgical History:  Procedure Laterality Date   BREAST BIOPSY Right 2000   breast ca   BREAST BIOPSY Right 2016   invasive mammary-mastectomy   BREAST BIOPSY Right 05/27/2022   US  RT BREAST BX W LOC DEV 1ST LESION IMG BX SPEC US  GUIDE 05/27/2022 [facility]   BREAST LUMPECTOMY Right 2000   breast ca with rad tx and tamoxifen   CATARACT EXTRACTION W/ INTRAOCULAR LENS  IMPLANT, BILATERAL     MASTECTOMY Right 2016   + complete mastectomy and letrozole  tx   SIMPLE MASTECTOMY WITH AXILLARY SENTINEL NODE BIOPSY Right 08/21/2014   Procedure: SIMPLE MASTECTOMY WITH AXILLARY SENTINEL NODE BIOPSY;  Surgeon: Unknown Sharps, MD;  Location: [facility] ORS;  Service: General;  Laterality: Right;   TONSILLECTOMY     VARICOSE VEIN SURGERY Left    laser surgery    Social History   Socioeconomic  History   Marital status: Married    Spouse name: Not on file   Number of children: Not on file   Years of education: Not on file   Highest education level: Not on file  Occupational History   Not on file  Tobacco Use   Smoking status: Former    Current packs/day: 0.00    Average packs/day: 0.5 packs/day for 10.0 years (5.0 ttl pk-yrs)    Types: Cigarettes    Start date: 08/04/1960    Quit date: 08/05/1970    Years since quitting: 53.7   Smokeless tobacco: Never  Vaping Use   Vaping status: Never Used  Substance and Sexual Activity   Alcohol use: No   Drug use: No   Sexual activity: Yes    Birth control/protection: Post-menopausal  Other Topics Concern   Not on file  Social History Narrative   Not on file   Social Drivers of Health   Tobacco Use: Medium Risk (04/12/2024)   Patient History    Smoking Tobacco Use: Former    Smokeless Tobacco Use: Never    Passive Exposure: Not on file  Financial Resource Strain: Low Risk  (09/14/2023)   Received from [facility]   Overall Financial Resource Strain (CARDIA)    Difficulty of Paying Living Expenses: Not hard at all  Food Insecurity: No Food Insecurity (02/09/2024)   Epic    Worried About Programme Researcher, Broadcasting/film/video in the Last Year: Never true    Ran Out of Food in the Last Year: Never true  Transportation Needs: No Transportation Needs (02/09/2024)   Epic    Lack of Transportation (Medical): No    Lack of Transportation (Non-Medical): No  Physical Activity: Not on file  Stress: Not on file  Social Connections: Unknown (02/09/2024)   Social Connection and Isolation Panel    Frequency of Communication with Friends and Family: More than three times a week    Frequency of Social Gatherings with Friends and Family: Three times a week    Attends Religious Services: Patient declined    Active Member of Clubs or Organizations: Patient declined    Attends Banker Meetings: Patient declined    Marital Status:  Married  Catering Manager Violence: Not At Risk (02/09/2024)   Epic    Fear of Current or Ex-Partner: No    Emotionally Abused: No    Physically Abused: No    Sexually Abused: No  Depression (PHQ2-9): Medium Risk (03/09/2024)   Depression (PHQ2-9)    PHQ-2 Score: 7  Alcohol Screen: Not on file  Housing: Low Risk (02/09/2024)   Epic    Unable to Pay for Housing in the Last Year: No    Number of Times Moved in the Last Year: 0    Homeless in the Last Year: No  Utilities: Not At Risk (02/09/2024)   Epic    Threatened with loss of utilities: No  Health Literacy: Not on file    Family History  Problem Relation Age of Onset   Osteoarthritis Mother    Heart disease Father    Dementia Sister    Breast cancer Daughter 76    Current Medications[2]  Physical exam:  Vitals:   04/12/24 1306  BP: 132/68  Pulse: (!) 56  Resp: 20  Temp: 97.6 F (36.4 C)  SpO2: 100%  Weight: 148 lb 14.4 oz (67.5 kg)   Physical Exam Cardiovascular:     Rate and Rhythm: Normal rate and regular rhythm.     Heart sounds: Normal heart sounds.  Pulmonary:     Effort: Pulmonary effort is normal.     Breath sounds: Normal breath sounds.  Skin:    General: Skin is warm and dry.  Neurological:     Mental Status: She is alert and oriented to person, place, and time.      I have personally reviewed labs listed below:    Latest Ref Rng & Units 04/12/2024   12:31 PM  CMP  Glucose 70 - 99 mg/dL 855   BUN 8 - 23 mg/dL 22   Creatinine 9.55 - 1.00 mg/dL 8.71   Sodium 864 - 854 mmol/L 143   Potassium 3.5 - 5.1 mmol/L 4.6   Chloride 98 - 111 mmol/L 104   CO2 22 - 32 mmol/L 27   Calcium  8.9 - 10.3 mg/dL 89.8   Total Protein 6.5 - 8.1 g/dL 7.0   Total Bilirubin 0.0 - 1.2 mg/dL 0.2   Alkaline Phos 38 - 126 U/L 96   AST 15 - 41 U/L 43   ALT 0 - 44 U/L 15       Latest Ref Rng & Units 04/12/2024   12:31 PM  CBC  WBC 4.0 - 10.5 K/uL 48.9   Hemoglobin 12.0 - 15.0 g/dL 87.9   Hematocrit 63.9 - 46.0 %  37.1    Platelets 150 - 400 K/uL 359       Assessment and plan- Patient is a 84 y.o. female with history of chronic phase CML not presently on any treatment here to discuss further management  Assessment and Plan    Chronic myeloid leukemia, BCR/ABL-positive Active BCR/ABL-positive CML with elevated leukocytosis. Previous dasatinib  intolerance due to severe diarrhea and syncope. TKI therapy essential to prevent leukocytosis progression. Hydrea discussed as temporary option if TKI intolerance persists. - Await BCR/ABL testing results. - Patient was not able to tolerate Dasatinib  in the past and was rehospitalized 2 days after starting the drug.  Discussed starting imatinib  low-dose at 200 mg daily.  Discussed risks and benefits of imatinib  including all but not limited to possible risk of nausea vomiting diarrhea low blood counts and risk of infection.  Patient understands and agrees to proceed as planned.  She understands that untreated CML has risk of progression to acute leukemia and overall mortality is 10 to 20 %/year with an average lifespan of 3 to 5 years if untreated. - Planned initiation of imatinib  early in the week for close monitoring and access to care for adverse effects. - Scheduled weekly visits during the first month of therapy to monitor for adverse effects and renal function. - Pharmacy will work on therapist, occupational for the same and let me know when the drug will be delivered to her - CBC with differential, CMP in 2 weeks and in 4 weeks.  See covering NP in 2 weeks and see me in 4 weeks.  BCR-ABL PCR testing in 4 weeks  Hypertension Hypertension managed with hydralazine . Discontinued hydrochlorothiazide  and lisinopril . - Removed hydrochlorothiazide  and lisinopril  from medication list. - Continued hydralazine  for blood pressure management.  Chronic kidney disease Chronic kidney disease requires monitoring, especially with planned imatinib  initiation. - Planned weekly renal  function assessments during the first month of imatinib  therapy.         Visit Diagnosis 1. CML (chronic myelocytic leukemia) (HCC)      Dr. Annah Skene, MD, MPH [facility] [phone] 04/12/2024 1:14 PM                   [1]  Allergies Allergen Reactions   Alendronate Diarrhea    Other reaction(s): Dizziness, Other (See Comments) Joint pain   Nadolol     Other reaction(s): Dizziness Per patient, Felt like the back of head was going to explode   Statins     Other reaction(s): Muscle Pain Mevacor-myalgias.  Crestor-Just didn't feel right.  [2]  Current Outpatient Medications:    acetaminophen  (TYLENOL ) 500 MG tablet, Take 500 mg by mouth every 6 (six) hours as needed for mild pain., Disp: , Rfl:    allopurinol (ZYLOPRIM) 300 MG tablet, Take 300 mg by mouth daily., Disp: , Rfl:    aspirin EC 81 MG tablet, Take 81 mg by mouth., Disp: , Rfl:    furosemide  (LASIX ) 20 MG tablet, Take 1 tablet, as needed, if you notice an increase in your weight (2-3lbs in a day) or for increase in edema., Disp: 30 tablet, Rfl: 2   gabapentin (NEURONTIN) 100 MG capsule, Take 200 mg by mouth 2 (two) times daily., Disp: , Rfl:    hydrALAZINE  (APRESOLINE ) 10 MG tablet, Take 1 tablet (10 mg total) by mouth in the morning and at bedtime., Disp: 60 tablet, Rfl: 2   levothyroxine  (SYNTHROID , LEVOTHROID) 100 MCG tablet, Take 100 mcg by mouth daily  before breakfast., Disp: , Rfl:    meclizine (ANTIVERT) 25 MG tablet, Take 25 mg by mouth 3 (three) times daily., Disp: , Rfl:    Melatonin 10 MG TABS, Take 1 tablet by mouth at bedtime., Disp: , Rfl:    colchicine 0.6 MG tablet, Take 0.6 mg by mouth as needed. (Patient not taking: Reported on 04/12/2024), Disp: , Rfl:    [Paused] dasatinib  (SPRYCEL ) 100 MG tablet, Take 1 tablet (100 mg total) by mouth daily. (Patient not taking: Reported on 04/12/2024), Disp: 30 tablet, Rfl: 2   [Paused] hydrochlorothiazide  (HYDRODIURIL )  12.5 MG tablet, Take 12.5 mg by mouth daily. (Patient not taking: Reported on 04/12/2024), Disp: , Rfl:    [Paused] lisinopril  (ZESTRIL ) 40 MG tablet, Take 40 mg by mouth daily. (Patient not taking: Reported on 04/12/2024), Disp: , Rfl:    [Paused] metFORMIN  (GLUCOPHAGE ) 500 MG tablet, Take 500 mg by mouth every morning.  (Patient not taking: Reported on 04/12/2024), Disp: , Rfl:    [Paused] spironolactone (ALDACTONE) 25 MG tablet, Take 25 mg by mouth daily. (Patient not taking: Reported on 04/12/2024), Disp: , Rfl:   "

## 2024-04-12 NOTE — Telephone Encounter (Signed)
 Oral Oncology Patient Advocate Encounter  After completing a benefits investigation, prior authorization for imatinib  is not required at this time through [facility].  Patient's copay is $65.01.     Nikkol Pai (Patty) Chet Burnet, CPhT  [facility], Zelda Salmon, [facility]/Oncology - Oral Chemotherapy Patient Advocate Specialist III Phone: [phone]  Fax: [phone]

## 2024-04-12 NOTE — Progress Notes (Signed)
 "  Clinical Pharmacist Practitioner Clinic [facility]  [phone] [phone]  Patient Care Team: Towana Small, FNP as PCP - General (Family Medicine) Claudene Larinda Bolder, MD (Inactive) as Referring Physician (Surgery) Georgina Shasta POUR, RN as Oncology Nurse Navigator Melanee Annah BROCKS, MD as Consulting Physician (Oncology)   Name of the patient: Sydney Parks  [id number]  1940-12-23   Date of visit: 04/12/2024   HPI: Patient is a 84 y.o. female with CML.  Patient was previously on Dasatinib  but that was stopped due to intolerance, plan is to now have patient try imatinib .  Reason for Consult: Imatinib  oral chemotherapy education.   PAST MEDICAL HISTORY: Past Medical History:  Diagnosis Date   Arthritis    Breast cancer (HCC) 2000 and 2016   right breast ca twice.  Mastectomy in 2016   Cataract    Diabetes mellitus without complication (HCC)    GERD (gastroesophageal reflux disease)    Hx of migraines    from ages [age]   Hypercholesteremia    Hypertension    Kidney stone on right side    Personal history of radiation therapy 2000   right breast ca   Thyroid disease    Varicose vein of leg     HEMATOLOGY/ONCOLOGY HISTORY:  Oncology History Overview Note  1.  Carcinoma breast (right) at 2:00 position biopsies positive for invasive mammary carcinoma Mammogram was done in April of 2016 Estrogen receptor positive progesterone receptor positive HER-2/neu receptor negative by fish Clinically staged as T1 b N0 M0 tumor 2.  Previous history of carcinoma breast in the same breast (right) Status post mastectomy. 3. Started letrozole  in May 2016.  And 16 years ago was Tis N0 M0 tumor status post lumpectomy and radiation therapy estrogen and progesterone receptor was positive and had received 5 years of tamoxifen   Breast CA (HCC) (Resolved)  08/08/2014 Initial Diagnosis   Breast CA   Breast cancer (HCC)  08/21/2014 Initial Diagnosis   Breast  cancer   CML (chronic myelocytic leukemia) (HCC)  01/17/2024 Initial Diagnosis   CML (chronic myelocytic leukemia) (HCC)   01/17/2024 Cancer Staging   Staging form: Chronic Myeloid Leukemia, AJCC 8th Edition - Clinical stage from 01/17/2024: Ph+ - Signed by Melanee Annah BROCKS, MD on 01/17/2024 Stage prefix: Initial diagnosis CML subtype: Chronic phase     ALLERGIES:  is allergic to alendronate, nadolol, and statins.  MEDICATIONS:  Current Outpatient Medications  Medication Sig Dispense Refill   acetaminophen  (TYLENOL ) 500 MG tablet Take 500 mg by mouth every 6 (six) hours as needed for mild pain.     allopurinol (ZYLOPRIM) 300 MG tablet Take 300 mg by mouth daily.     aspirin EC 81 MG tablet Take 81 mg by mouth.     colchicine 0.6 MG tablet Take 0.6 mg by mouth as needed. (Patient not taking: Reported on 04/12/2024)     furosemide  (LASIX ) 20 MG tablet Take 1 tablet, as needed, if you notice an increase in your weight (2-3lbs in a day) or for increase in edema. 30 tablet 2   gabapentin (NEURONTIN) 100 MG capsule Take 200 mg by mouth 2 (two) times daily.     hydrALAZINE  (APRESOLINE ) 10 MG tablet Take 1 tablet (10 mg total) by mouth in the morning and at bedtime. 60 tablet 2   levothyroxine  (SYNTHROID , LEVOTHROID) 100 MCG tablet Take 100 mcg by mouth daily before breakfast.     meclizine (ANTIVERT) 25 MG tablet Take 25 mg by mouth  3 (three) times daily.     Melatonin 10 MG TABS Take 1 tablet by mouth at bedtime.     No current facility-administered medications for this visit.    VITAL SIGNS: There were no vitals taken for this visit. There were no vitals filed for this visit.  Estimated body mass index is 24.03 kg/m as calculated from the following:   Height as of 03/09/24: 5' 6 (1.676 m).   Weight as of an earlier encounter on 04/12/24: 67.5 kg (148 lb 14.4 oz).  LABS: CBC:    Component Value Date/Time   WBC 48.9 (H) 04/12/2024 1231   WBC 16.1 (H) 02/10/2024 0405   HGB 12.0  04/12/2024 1231   HCT 37.1 04/12/2024 1231   PLT 359 04/12/2024 1231   MCV 85.3 04/12/2024 1231   NEUTROABS 16.5 (H) 04/12/2024 1231   LYMPHSABS 7.7 (H) 04/12/2024 1231   MONOABS 3.0 (H) 04/12/2024 1231   EOSABS 1.6 (H) 04/12/2024 1231   BASOSABS 5.3 (H) 04/12/2024 1231   Comprehensive Metabolic Panel:    Component Value Date/Time   NA 143 04/12/2024 1231   NA 140 02/21/2024 1510   K 4.6 04/12/2024 1231   CL 104 04/12/2024 1231   CO2 27 04/12/2024 1231   BUN 22 04/12/2024 1231   BUN 19 02/21/2024 1510   CREATININE 1.28 (H) 04/12/2024 1231   GLUCOSE 144 (H) 04/12/2024 1231   CALCIUM  10.1 04/12/2024 1231   AST 43 (H) 04/12/2024 1231   ALT 15 04/12/2024 1231   ALKPHOS 96 04/12/2024 1231   BILITOT 0.2 04/12/2024 1231   PROT 7.0 04/12/2024 1231   ALBUMIN 4.2 04/12/2024 1231     Present during today's visit: Patient and her daughter Sydney Parks plan: Patient will start imatinib  once she has medication in hand   Patient Education I spoke with patient for overview of new oral chemotherapy medication: Imatinib   CMP from 04/12/2024 assessed, serum creatinine 1.28, creatinine clearance 31 mL/min. Prescription dose and frequency assessed.  Patient is being started on reduced dose due to renal function and previous intolerance to TKI therapy.  MD plans to increase her dose as tolerated  Treatment goal: Control  Administration: Counseled patient on administration, dosing, side effects, monitoring, drug-food interactions, safe handling, storage, and disposal. Patient will take 2 tablets (200 mg) by mouth daily with food.  Side Effects: Side effects include but not limited to: Rash, nausea, diarrhea, fatigue, edema, decreased WBC/Hgb/plt, changes in liver function.    Drug-drug Interactions (DDI): Levothyroxine : Imatinib  may decrease the serum concentration of thyroid products.  Would recommend monitoring thyroid function once patient is on combination therapy. Acetaminophen :  Acetaminophen  may increase the hepatic toxic effects of imatinib .  Patient reports only taking acetaminophen  occasionally.  This use is okay, her hepatic function will be monitored while on therapy.  No baseline dose adjustments needed  Adherence: After discussion with patient no patient barriers to medication adherence identified.  Reviewed with patient importance of keeping a medication schedule and plan for any missed doses.  Distress evaluation: Distress thermometer not completed during telephone call as patient has been on previous lines of therapy.   Communication and Learning Assessment Primary learner: patient and her daughter Barriers to learning: No barriers Preferred language: English Learning preferences: Listening Reading  Ms. Brideau voiced understanding and appreciation. All questions answered. Medication handout provided.  Provided patient with Oral Chemotherapy Navigation Clinic phone number. Patient knows to call the office with questions or concerns. Oral Chemotherapy Navigation Clinic will continue  to follow.  Patient expressed understanding and was in agreement with this plan. She also understands that She can call clinic at any time with any questions, concerns, or complaints.   Medication Access Issues: PA pending  Follow-up plan: Return to clinic as scheduled  Thank you for allowing me to participate in the care of this patient.   Time Total: 30 minutes  Visit consisted of counseling and education on dealing with issues of symptom management in the setting of serious and potentially life-threatening illness.Greater than 50%  of this time was spent counseling and coordinating care related to the above assessment and plan.  Signed by: Hyatt Capobianco N. Ryota Treece, PharmD, NEILA, CPP Hematology/Oncology Clinical Pharmacist Practitioner [place] [phone]  04/12/2024 2:20 PM  "

## 2024-04-13 ENCOUNTER — Telehealth: Payer: Self-pay

## 2024-04-13 NOTE — Patient Instructions (Signed)
 [facility] - A DEPT OF [facility]    Thank you for choosing [facility] to provide your oncology/hematology care and for allowing us  to participate in your care today!  As a reminder, we spoke about the following today: Imatinib  for the treatment of CML, planned duration until disease control or unacceptable toxicity  Treatment goal: Control  Medication handout has been provided.   **For oral cancer medication prescription refill requests, contact your pharmacy and they will contact our office if needed. Allow 5-7 days for refills to be completed by your specialty pharmacy.    Cancer Center General Instructions:  If you have an appointment at the [facility], please go directly to the Cancer Center and check in at the registration area.  We strive to give you quality time with your provider. You may need to reschedule your appointment if you arrive late (15 or more minutes).  Arriving late affects you and other patients whose appointments are after yours.  Also, if you miss three or more appointments without notifying the office, you may be dismissed from the clinic at the provider's discretion.      BELOW ARE SYMPTOMS THAT SHOULD BE REPORTED IMMEDIATELY: *FEVER GREATER THAN 100.4 F (38 C) OR HIGHER *CHILLS OR SWEATING *NAUSEA AND VOMITING THAT IS NOT CONTROLLED WITH YOUR NAUSEA MEDICATION *UNUSUAL SHORTNESS OF BREATH *UNUSUAL BRUISING OR BLEEDING *URINARY PROBLEMS (pain or burning when urinating, or frequent urination) *BOWEL PROBLEMS (unusual diarrhea, constipation, pain near the anus) TENDERNESS IN MOUTH AND THROAT WITH OR WITHOUT PRESENCE OF ULCERS (sore throat, sores in mouth, or a toothache) UNUSUAL RASH, SWELLING OR PAIN  UNUSUAL VAGINAL DISCHARGE OR ITCHING   Items with * indicate a potential emergency and should be followed up as soon as possible or go to the Emergency Department if any problems should occur.  Please show  the CHEMOTHERAPY ALERT CARD at check-in to the Emergency Department and triage nurse.  Should you have questions after your visit or need to cancel or reschedule your appointment, please contact [facility] - A DEPT OF [facility]  [phone] and follow the prompts.  Office hours are 8:00 a.m. to 4:30 p.m. Monday - Friday. Please note that voicemails left after 4:00 p.m. may not be returned until the following business day.  We are closed weekends and major holidays. You have access to a nurse at all times for urgent questions. Please call the main number to the clinic [phone] and follow the prompts.  For any non-urgent questions, you may also contact your provider using MyChart. We now offer e-Visits for anyone 55 and older to request care online for non-urgent symptoms. For details visit [email or web address].   Also download the MyChart app! Go to the app store, search MyChart, open the app, select [facility], and log in with your MyChart username and password.

## 2024-04-14 ENCOUNTER — Other Ambulatory Visit: Payer: Self-pay

## 2024-04-14 ENCOUNTER — Other Ambulatory Visit: Payer: Self-pay | Admitting: Pharmacy Technician

## 2024-04-14 ENCOUNTER — Other Ambulatory Visit (HOSPITAL_COMMUNITY): Payer: Self-pay

## 2024-04-14 ENCOUNTER — Telehealth: Payer: Self-pay | Admitting: Pharmacy Technician

## 2024-04-14 NOTE — Progress Notes (Signed)
 Specialty Pharmacy Initial Fill Coordination Note  Sydney Parks is a 84 y.o. female contacted today regarding initial fill of specialty medication(s) Imatinib  Mesylate (GLEEVEC )   Patient requested Delivery   Delivery date: 04/19/24   Verified address: [address]   [place] [address]   Medication will be filled on: 04/18/24   Patient is aware of $65.01 copayment. (patient's daughter will call the pharmacy directly with card information before it is scheduled to be shipped)   [place] (Patty) Chet Burnet, CPhT  [facility], Zelda Salmon, [facility] - Oral Chemotherapy Patient Advocate Specialist III Phone: [phone]  Fax: [phone]

## 2024-04-14 NOTE — Telephone Encounter (Signed)
 Oral Oncology Patient Advocate Encounter  Patient successfully OnBoarded and drug education provided by pharmacist. Medication scheduled to be shipped on 01/13 for delivery on 01/14 from [facility] to patient's address. Patient also knows to call me at [phone] with any questions or concerns regarding receiving medication or if there is any unexpected change in co-pay.    Sydney Parks (Patty) Chet Burnet, CPhT  [facility], Zelda Salmon, [facility] - Oral Chemotherapy Patient Advocate Specialist III Phone: [phone]  Fax: [phone]

## 2024-04-14 NOTE — Progress Notes (Signed)
 Patient education documented in EPIC note on 04/12/24.

## 2024-04-16 ENCOUNTER — Encounter: Payer: Self-pay | Admitting: Oncology

## 2024-04-17 ENCOUNTER — Other Ambulatory Visit: Payer: Self-pay

## 2024-04-17 ENCOUNTER — Ambulatory Visit: Payer: Self-pay | Admitting: Family Medicine

## 2024-04-17 DIAGNOSIS — E7800 Pure hypercholesterolemia, unspecified: Secondary | ICD-10-CM

## 2024-04-17 DIAGNOSIS — E782 Mixed hyperlipidemia: Secondary | ICD-10-CM

## 2024-04-17 MED ORDER — EZETIMIBE 10 MG PO TABS: 10.0000 mg | ORAL_TABLET | Freq: Every day | ORAL | 3 refills | Status: AC

## 2024-04-17 NOTE — Telephone Encounter (Signed)
 Can cel lab appt on 1/21. Lab appt should be 2 weeks after 1/19 and see me 4 weeks after starting imatinib 

## 2024-04-18 ENCOUNTER — Other Ambulatory Visit: Payer: Self-pay

## 2024-04-21 ENCOUNTER — Encounter: Payer: Self-pay | Admitting: Family Medicine

## 2024-04-21 DIAGNOSIS — E119 Type 2 diabetes mellitus without complications: Secondary | ICD-10-CM

## 2024-04-21 LAB — BCR-ABL1, CML/ALL, PCR, QUANT
E1A2 Transcript: 0.0281 %
Interpretation (BCRAL):: POSITIVE — AB
b2a2 transcript: 4.7857 %
b3a2 transcript: 41.1628 %

## 2024-04-24 ENCOUNTER — Other Ambulatory Visit: Payer: Self-pay | Admitting: Family Medicine

## 2024-04-24 DIAGNOSIS — E119 Type 2 diabetes mellitus without complications: Secondary | ICD-10-CM

## 2024-04-24 MED ORDER — BLOOD GLUCOSE TEST VI STRP: 1.0000 | ORAL_STRIP | 3 refills | Status: DC

## 2024-04-26 ENCOUNTER — Inpatient Hospital Stay

## 2024-04-26 ENCOUNTER — Inpatient Hospital Stay: Admitting: Nurse Practitioner

## 2024-04-27 ENCOUNTER — Telehealth: Payer: Self-pay | Admitting: Family Medicine

## 2024-04-27 NOTE — Telephone Encounter (Signed)
 Copied from CRM #[id number]. Topic: Clinical - Home Health Verbal Orders >> Apr 27, 2024  1:06 PM Larissa RAMAN wrote: Grayce with [facility] requesting a callback to discuss home health nursing orders. Please contact at [phone]

## 2024-04-28 ENCOUNTER — Other Ambulatory Visit: Payer: Self-pay

## 2024-04-28 ENCOUNTER — Other Ambulatory Visit: Payer: Self-pay | Admitting: Pharmacist

## 2024-04-28 NOTE — Progress Notes (Signed)
 Specialty Pharmacy Ongoing Clinical Assessment Note  Sydney Parks is a 84 y.o. female who is being followed by the specialty pharmacy service for RxSp Oncology   Patient's specialty medication(s) reviewed today: Imatinib  Mesylate (GLEEVEC )   Missed doses in the last 4 weeks: 0 (Started taking med on 04/23/24)   Patient/Caregiver did not have any additional questions or concerns.   Therapeutic benefit summary: Patient is achieving benefit   Adverse events/side effects summary: No adverse events/side effects   Patient's therapy is appropriate to: Continue    Goals Addressed             This Visit's Progress    Achieve or maintain remission   On track    Patient is initiating therapy. Patient will maintain adherence         Follow up: 3 months  Lyle LELON Chalk Specialty Pharmacist

## 2024-05-02 ENCOUNTER — Telehealth: Payer: Self-pay

## 2024-05-02 NOTE — Telephone Encounter (Signed)
" °  Will fax after signed provider is out of office.   Copied from CRM #[id number]. Topic: Clinical - Home Health Verbal Orders >> Mar 31, 2024 11:00 AM Winona SAUNDERS wrote: Grayce from [facility] calling to follow up on orders faxed for Home health plan of care, 2 for social work, one for nursing order. Please check in to this and contact her at [phone] >> May 02, 2024 12:05 PM Montie POUR wrote: Please call Sonja to let her know when she can get signed orders above back. Her number is [phone].  >> Apr 27, 2024  1:06 PM Larissa RAMAN wrote: Grayce with [facility] requesting a callback to discuss home health nursing orders. Please contact at [phone] "

## 2024-05-03 ENCOUNTER — Telehealth: Payer: Self-pay | Admitting: Family Medicine

## 2024-05-03 NOTE — Telephone Encounter (Signed)
 Copied from CRM #[id number]. Topic: General - Other >> May 03, 2024 11:59 AM Terri MATSU wrote: Reason for CRM: Grayce from [facility] calling regarding order number [id number], checking the status on it. Callback number [phone]

## 2024-05-05 ENCOUNTER — Ambulatory Visit

## 2024-05-05 VITALS — BP 144/62 | Ht 66.0 in | Wt 152.8 lb

## 2024-05-05 DIAGNOSIS — Z0000 Encounter for general adult medical examination without abnormal findings: Secondary | ICD-10-CM | POA: Diagnosis not present

## 2024-05-05 NOTE — Patient Instructions (Addendum)
 Sydney Parks,  Thank you for taking the time for your Medicare Wellness Visit. I appreciate your continued commitment to your health goals. Please review the care plan we discussed, and feel free to reach out if I can assist you further.  Please note that Annual Wellness Visits do not include a physical exam. Some assessments may be limited, especially if the visit was conducted virtually. If needed, we may recommend an in-person follow-up with your provider.  Ongoing Care Seeing your primary care provider every 3 to 6 months helps us  monitor your health and provide consistent, personalized care. 09/04/24 @ 10:30 AM APPT FOR FOLLOW UP W/ KRISTINA BUTLER  Referrals If a referral was made during today's visit and you haven't received any updates within two weeks, please contact the referred provider directly to check on the status.  Recommended Screenings:  Health Maintenance  Topic Date Due   Medicare Annual Wellness Visit  Never done   Complete foot exam   Never done   Eye exam for diabetics  Never done   Kidney health urinalysis for diabetes  Never done   DTaP/Tdap/Td vaccine (1 - Tdap) Never done   Zoster (Shingles) Vaccine (1 of 2) [birth year]/1959   COVID-19 Vaccine (3 - Pfizer risk series) 06/29/2019   Flu Shot  07/04/2024*   Hemoglobin A1C  08/20/2024   Breast Cancer Screening  12/02/2024   Yearly kidney function blood test for diabetes  04/12/2025   Pneumococcal Vaccine for age over 11  Completed   Osteoporosis screening with Bone Density Scan  Completed   Meningitis B Vaccine  Aged Out  *Topic was postponed. The date shown is not the original due date.       05/05/2024    3:12 PM  Advanced Directives  Does Patient Have a Medical Advance Directive? Yes  Type of Estate Agent of [place];Living will  Does patient want to make changes to medical advance directive? No - Patient declined  Copy of Healthcare Power of Attorney in Chart? Yes - validated most recent  copy scanned in chart (See row information)    Vision: Annual vision screenings are recommended for early detection of glaucoma, cataracts, and diabetic retinopathy. These exams can also reveal signs of chronic conditions such as diabetes and high blood pressure.  Dental: Annual dental screenings help detect early signs of oral cancer, gum disease, and other conditions linked to overall health, including heart disease and diabetes.  Please see the attached documents for additional preventive care recommendations.   NEXT AWV 06/01/25 @ 2:20 PM IN PERSON

## 2024-05-05 NOTE — Progress Notes (Signed)
 "  Chief Complaint  Patient presents with   Medicare Wellness     Subjective:   Sydney Parks is a 84 y.o. female who presents for a Medicare Annual Wellness Visit.  Visit info / Clinical Intake: Medicare Wellness Visit Type:: Subsequent Annual Wellness Visit Persons participating in visit and providing information:: patient & caregiver Medicare Wellness Visit Mode:: In-person (required for WTM) Interpreter Needed?: No Pre-visit prep was completed: yes AWV questionnaire completed by patient prior to visit?: no Living arrangements:: lives with spouse/significant other; with family/others Patient's Overall Health Status Rating: very good Typical amount of pain: some (shoulder, right) Does pain affect daily life?: no Are you currently prescribed opioids?: no  Dietary Habits and Nutritional Risks How many meals a day?: 2 Eats fruit and vegetables daily?: yes Most meals are obtained by: preparing own meals; eating out In the last 2 weeks, have you had any of the following?: none Diabetic:: no (per pt caregiver- is checking BS every day)  Functional Status Activities of Daily Living (to include ambulation/medication): Independent Ambulation: Independent Medication Administration: Independent Home Management (perform basic housework or laundry): Independent Manage your own finances?: yes Primary transportation is: driving; family / friends Concerns about vision?: no *vision screening is required for WTM* (rx glasses- [facility]- YEARLY) Concerns about hearing?: no (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, DOESN'T USE OFTEN)  Fall Screening Falls in the past year?: 0 Number of falls in past year: 0 Was there an injury with Fall?: 0 Fall Risk Category Calculator: 0 Patient Fall Risk Level: Low Fall Risk  Fall Risk Patient at Risk for Falls Due to: No Fall Risks Fall risk Follow up: Falls evaluation completed; Falls prevention discussed  Home and Transportation Safety: All rugs have non-skid  backing?: yes All stairs or steps have railings?: yes Grab bars in the bathtub or shower?: yes Have non-skid surface in bathtub or shower?: yes Good home lighting?: yes Regular seat belt use?: yes Hospital stays in the last year:: (!) yes How many hospital stays:: 1 Reason: DEHYDRATION; LEUKEMIA  Cognitive Assessment Difficulty concentrating, remembering, or making decisions? : yes (MEMORY) Will 6CIT or Mini Cog be Completed: yes What year is it?: 0 points What month is it?: 0 points Give patient an address phrase to remember (5 components): [address] [place], [place] About what time is it?: 0 points Count backwards from 20 to 1: 0 points Say the months of the year in reverse: 0 points Repeat the address phrase from earlier: 0 points 6 CIT Score: 0 points  Advance Directives (For Healthcare) Does Patient Have a Medical Advance Directive?: Yes Does patient want to make changes to medical advance directive?: No - Patient declined Type of Advance Directive: Healthcare Power of [place]; Living will Copy of Healthcare Power of Attorney in Chart?: Yes - validated most recent copy scanned in chart (See row information) Copy of Living Will in Chart?: Yes - validated most recent copy scanned in chart (See row information)  Reviewed/Updated  Reviewed/Updated: Reviewed All (Medical, Surgical, Family, Medications, Allergies, Care Teams, Patient Goals)    Allergies (verified) Alendronate, Nadolol, and Statins   Current Medications (verified) Outpatient Encounter Medications as of 05/05/2024  Medication Sig   acetaminophen  (TYLENOL ) 500 MG tablet Take 500 mg by mouth every 6 (six) hours as needed for mild pain.   allopurinol (ZYLOPRIM) 300 MG tablet Take 300 mg by mouth daily.   aspirin EC 81 MG tablet Take 81 mg by mouth.   ezetimibe  (ZETIA ) 10 MG tablet Take  1 tablet (10 mg total) by mouth daily.   furosemide  (LASIX ) 20 MG tablet Take 1 tablet, as needed, if you notice an increase  in your weight (2-3lbs in a day) or for increase in edema.   gabapentin (NEURONTIN) 100 MG capsule Take 200 mg by mouth 2 (two) times daily.   glucose blood (FREESTYLE LITE) test strip 1 each by Other route daily.   hydrALAZINE  (APRESOLINE ) 10 MG tablet Take 1 tablet (10 mg total) by mouth in the morning and at bedtime.   imatinib  (GLEEVEC ) 100 MG tablet Take 2 tablets (200 mg total) by mouth daily. Take with meals and large glass of water .Caution:Chemotherapy   levothyroxine  (SYNTHROID , LEVOTHROID) 100 MCG tablet Take 100 mcg by mouth daily before breakfast.   meclizine (ANTIVERT) 25 MG tablet Take 25 mg by mouth 3 (three) times daily.   Melatonin 10 MG TABS Take 1 tablet by mouth at bedtime.   No facility-administered encounter medications on file as of 05/05/2024.    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  Mastectomy in 2016   Cataract    Diabetes mellitus without complication (HCC)    GERD (gastroesophageal reflux disease)    Hx of migraines    from ages [age]   Hypercholesteremia    Hypertension    Kidney stone on right side    Personal history of radiation therapy 2000   right breast ca   Thyroid disease    Varicose vein of leg    Past Surgical History:  Procedure Laterality Date   BREAST BIOPSY Right 2000   breast ca   BREAST BIOPSY Right 2016   invasive mammary-mastectomy   BREAST BIOPSY Right 05/27/2022   US  RT BREAST BX W LOC DEV 1ST LESION IMG BX SPEC US  GUIDE 05/27/2022 [facility]   BREAST LUMPECTOMY Right 2000   breast ca with rad tx and tamoxifen   CATARACT EXTRACTION W/ INTRAOCULAR LENS  IMPLANT, BILATERAL     MASTECTOMY Right 2016   + complete mastectomy and letrozole  tx   SIMPLE MASTECTOMY WITH AXILLARY SENTINEL NODE BIOPSY Right 08/21/2014   Procedure: SIMPLE MASTECTOMY WITH AXILLARY SENTINEL NODE BIOPSY;  Surgeon: Unknown Sharps, MD;  Location: [facility] ORS;  Service: General;   Laterality: Right;   TONSILLECTOMY     VARICOSE VEIN SURGERY Left    laser surgery   Family History  Problem Relation Age of Onset   Osteoarthritis Mother    Heart disease Father    Dementia Sister    Breast cancer Daughter 29   Social History   Occupational History   Not on file  Tobacco Use   Smoking status: Former    Current packs/day: 0.00    Average packs/day: 0.5 packs/day for 10.0 years (5.0 ttl pk-yrs)    Types: Cigarettes    Start date: 08/04/1960    Quit date: 08/05/1970    Years since quitting: 53.7   Smokeless tobacco: Never  Vaping Use   Vaping status: Never Used  Substance and Sexual Activity   Alcohol use: No   Drug use: No   Sexual activity: Yes    Birth control/protection: Post-menopausal   Tobacco Counseling Counseling given: Not Answered  SDOH Screenings   Food Insecurity: No Food Insecurity (05/05/2024)  Housing: Unknown (05/05/2024)  Transportation Needs: No Transportation Needs (05/05/2024)  Utilities: Not At Risk (05/05/2024)  Depression (PHQ2-9): Low Risk (05/05/2024)  Recent Concern: Depression (  PHQ2-9) - Medium Risk (03/09/2024)  Financial Resource Strain: Low Risk  (09/14/2023)   Received from [facility]  Physical Activity: Insufficiently Active (05/05/2024)  Social Connections: Moderately Isolated (05/05/2024)  Stress: No Stress Concern Present (05/05/2024)  Tobacco Use: Medium Risk (05/05/2024)  Health Literacy: Adequate Health Literacy (05/05/2024)   See flowsheets for full screening details  Depression Screen PHQ 2 & 9 Depression Scale- Over the past 2 weeks, how often have you been bothered by any of the following problems? Little interest or pleasure in doing things: 0 Feeling down, depressed, or hopeless (PHQ Adolescent also includes...irritable): 0 PHQ-2 Total Score: 0 Trouble falling or staying asleep, or sleeping too much: 0 Feeling tired or having little energy: 0 Poor appetite or overeating (PHQ Adolescent also  includes...weight loss): 0 Feeling bad about yourself - or that you are a failure or have let yourself or your family down: 0 Trouble concentrating on things, such as reading the newspaper or watching television (PHQ Adolescent also includes...like school work): 0 Moving or speaking so slowly that other people could have noticed. Or the opposite - being so fidgety or restless that you have been moving around a lot more than usual: 0 Thoughts that you would be better off dead, or of hurting yourself in some way: 0 PHQ-9 Total Score: 0 If you checked off any problems, how difficult have these problems made it for you to do your work, take care of things at home, or get along with other people?: Somewhat difficult  Depression Treatment Depression Interventions/Treatment : -- (seeing provider today to get intervention)     Goals Addressed             This Visit's Progress    DIET - EAT MORE FRUITS AND VEGETABLES               Objective:    Today's Vitals   05/05/24 1504  BP: (!) 144/62  Weight: 152 lb 12.8 oz (69.3 kg)  Height: 5' 6 (1.676 m)   Body mass index is 24.66 kg/m.  Hearing/Vision screen No results found. Immunizations and Health Maintenance Health Maintenance  Topic Date Due   FOOT EXAM  Never done   OPHTHALMOLOGY EXAM  Never done   Diabetic kidney evaluation - Urine ACR  Never done   DTaP/Tdap/Td (1 - Tdap) Never done   Zoster Vaccines- Shingrix (1 of 2) 08/12/1959   COVID-19 Vaccine (3 - Pfizer risk series) 06/29/2019   Influenza Vaccine  07/04/2024 (Originally 11/05/2023)   HEMOGLOBIN A1C  08/20/2024   Mammogram  12/02/2024   Diabetic kidney evaluation - eGFR measurement  04/12/2025   Medicare Annual Wellness (AWV)  05/05/2025   Pneumococcal Vaccine: 50+ Years  Completed   Bone Density Scan  Completed   Meningococcal B Vaccine  Aged Out        Assessment/Plan:  This is a routine wellness examination for Sydney Parks.  Patient Care Team: Towana Small, FNP as PCP - General (Family Medicine) Claudene Larinda Bolder, MD (Inactive) as Referring Physician (Surgery) Georgina Shasta POUR, RN as Oncology Nurse Navigator Melanee Annah BROCKS, MD as Consulting Physician (Oncology) [facility], [facility]  I have personally reviewed and noted the following in the patients chart:   Medical and social history Use of alcohol, tobacco or illicit drugs  Current medications and supplements including opioid prescriptions. Functional ability and status Nutritional status Physical activity Advanced directives List of other physicians Hospitalizations, surgeries, and ER visits in previous 12  months Vitals Screenings to include cognitive, depression, and falls Referrals and appointments  No orders of the defined types were placed in this encounter.  In addition, I have reviewed and discussed with patient certain preventive protocols, quality metrics, and best practice recommendations. A written personalized care plan for preventive services as well as general preventive health recommendations were provided to patient.   Jhonnie GORMAN Das, LPN   [id number]/[id number]   Return in about 1 year (around 05/05/2025).  After Visit Summary: (In Person-Declined) Patient declined AVS at this time.  Nurse Notes: UTD ON PNA; NEEDS TDAP, SHINGRIX; UTD ON MAMMOGRAM (DUE IN AUGUST); DECLINES BDS; AGED OUT OF COLONOSCOPY  No voiced or noted concerns at this time MADE . F/U APPT W/ KRISTINA BUTLER FOR 09/04/24 @ 10:30 AM  "

## 2024-05-08 ENCOUNTER — Inpatient Hospital Stay

## 2024-05-08 ENCOUNTER — Inpatient Hospital Stay: Admitting: Nurse Practitioner

## 2024-05-09 ENCOUNTER — Telehealth: Payer: Self-pay | Admitting: Oncology

## 2024-05-09 NOTE — Telephone Encounter (Signed)
 Per Dr. Melanee If she is doing okay on her medication she can wait until 2/17.  Otherwise I can see her sometime next week.  Voice message left; but informed that if patient does not have any new questions / concerns they are okay to wait until 2/17 appointment as scheduled.

## 2024-05-09 NOTE — Telephone Encounter (Signed)
 Pt daughter called and states the pt wont be able to make her appts tomorrow due to the weather. She wants to know does the pt need to be seen sooner than 2/17?

## 2024-05-10 ENCOUNTER — Inpatient Hospital Stay

## 2024-05-10 ENCOUNTER — Inpatient Hospital Stay: Admitting: Oncology

## 2024-05-10 ENCOUNTER — Inpatient Hospital Stay: Admitting: Nurse Practitioner

## 2024-05-11 ENCOUNTER — Other Ambulatory Visit: Payer: Self-pay | Admitting: Oncology

## 2024-05-11 ENCOUNTER — Other Ambulatory Visit (HOSPITAL_COMMUNITY): Payer: Self-pay

## 2024-05-11 DIAGNOSIS — C9210 Chronic myeloid leukemia, BCR/ABL-positive, not having achieved remission: Secondary | ICD-10-CM

## 2024-05-12 ENCOUNTER — Other Ambulatory Visit: Payer: Self-pay

## 2024-05-12 MED ORDER — IMATINIB MESYLATE 100 MG PO TABS
200.0000 mg | ORAL_TABLET | Freq: Every day | ORAL | 0 refills | Status: AC
  Filled 2024-05-12 (×2): qty 60, 30d supply, fill #0

## 2024-05-23 ENCOUNTER — Inpatient Hospital Stay

## 2024-05-23 ENCOUNTER — Inpatient Hospital Stay: Admitting: Oncology

## 2024-06-05 ENCOUNTER — Other Ambulatory Visit

## 2024-06-05 ENCOUNTER — Ambulatory Visit: Admitting: Oncology

## 2024-09-04 ENCOUNTER — Ambulatory Visit: Admitting: Family Medicine

## 2025-06-01 ENCOUNTER — Ambulatory Visit
